# Patient Record
Sex: Female | Born: 1940 | Race: White | Hispanic: No | Smoking: Never smoker
Health system: Southern US, Community
[De-identification: ages and names within clinical notes are randomized; demographics above are authoritative.]

## PROBLEM LIST (undated history)

## (undated) DIAGNOSIS — M8580 Other specified disorders of bone density and structure, unspecified site: Secondary | ICD-10-CM

## (undated) DIAGNOSIS — I10 Essential (primary) hypertension: Secondary | ICD-10-CM

## (undated) DIAGNOSIS — G4733 Obstructive sleep apnea (adult) (pediatric): Secondary | ICD-10-CM

## (undated) HISTORY — DX: Essential (primary) hypertension: I10

## (undated) HISTORY — PX: TONSILLECTOMY AND ADENOIDECTOMY: SHX28

## (undated) HISTORY — DX: Obstructive sleep apnea (adult) (pediatric): G47.33

## (undated) HISTORY — PX: EYE SURGERY: SHX253

## (undated) HISTORY — DX: Other specified disorders of bone density and structure, unspecified site: M85.80

---

## 2000-04-20 ENCOUNTER — Ambulatory Visit (HOSPITAL_COMMUNITY): Admission: RE | Admit: 2000-04-20 | Discharge: 2000-04-20 | Payer: Self-pay | Admitting: Neurosurgery

## 2000-04-20 ENCOUNTER — Encounter: Payer: Self-pay | Admitting: Neurosurgery

## 2001-04-12 ENCOUNTER — Encounter: Payer: Self-pay | Admitting: Neurosurgery

## 2001-04-12 ENCOUNTER — Ambulatory Visit (HOSPITAL_COMMUNITY): Admission: RE | Admit: 2001-04-12 | Discharge: 2001-04-12 | Payer: Self-pay | Admitting: Neurosurgery

## 2008-07-24 DIAGNOSIS — G4733 Obstructive sleep apnea (adult) (pediatric): Secondary | ICD-10-CM | POA: Insufficient documentation

## 2009-01-01 DIAGNOSIS — G4700 Insomnia, unspecified: Secondary | ICD-10-CM

## 2010-03-12 DIAGNOSIS — R609 Edema, unspecified: Secondary | ICD-10-CM

## 2011-04-04 ENCOUNTER — Ambulatory Visit: Payer: Self-pay | Admitting: Family Medicine

## 2011-07-27 ENCOUNTER — Ambulatory Visit: Payer: Self-pay | Admitting: Family Medicine

## 2011-08-01 DIAGNOSIS — M8580 Other specified disorders of bone density and structure, unspecified site: Secondary | ICD-10-CM | POA: Insufficient documentation

## 2011-08-01 DIAGNOSIS — M810 Age-related osteoporosis without current pathological fracture: Secondary | ICD-10-CM | POA: Insufficient documentation

## 2012-05-15 ENCOUNTER — Ambulatory Visit: Payer: Self-pay | Admitting: Family Medicine

## 2013-05-16 ENCOUNTER — Ambulatory Visit: Payer: Self-pay | Admitting: Family Medicine

## 2013-08-28 ENCOUNTER — Ambulatory Visit: Payer: Self-pay | Admitting: Family Medicine

## 2013-08-28 LAB — HM DEXA SCAN

## 2013-09-20 ENCOUNTER — Ambulatory Visit: Payer: Self-pay | Admitting: Gastroenterology

## 2013-09-20 LAB — HM COLONOSCOPY

## 2013-09-23 LAB — PATHOLOGY REPORT

## 2014-05-19 ENCOUNTER — Ambulatory Visit: Payer: Self-pay | Admitting: Family Medicine

## 2014-05-20 ENCOUNTER — Ambulatory Visit: Payer: Self-pay | Admitting: Family Medicine

## 2014-09-09 LAB — BASIC METABOLIC PANEL: GLUCOSE: 100 mg/dL

## 2014-09-09 LAB — LIPID PANEL
Cholesterol: 196 mg/dL (ref 0–200)
HDL: 77 mg/dL — AB (ref 35–70)
LDL Cholesterol: 102 mg/dL
Triglycerides: 85 mg/dL (ref 40–160)

## 2014-09-09 LAB — TSH: TSH: 1.12 u[IU]/mL (ref 0.41–5.90)

## 2014-11-17 ENCOUNTER — Ambulatory Visit: Payer: Self-pay | Admitting: Family Medicine

## 2014-11-17 LAB — HM MAMMOGRAPHY

## 2015-03-10 DIAGNOSIS — H2513 Age-related nuclear cataract, bilateral: Secondary | ICD-10-CM | POA: Diagnosis not present

## 2015-03-31 ENCOUNTER — Other Ambulatory Visit: Payer: Self-pay | Admitting: Family Medicine

## 2015-03-31 DIAGNOSIS — R921 Mammographic calcification found on diagnostic imaging of breast: Secondary | ICD-10-CM

## 2015-05-21 ENCOUNTER — Ambulatory Visit: Payer: Medicare Other

## 2015-05-21 ENCOUNTER — Ambulatory Visit: Payer: Self-pay

## 2015-05-21 ENCOUNTER — Ambulatory Visit
Admission: RE | Admit: 2015-05-21 | Discharge: 2015-05-21 | Disposition: A | Discharge status: Home / Self Care | Payer: Medicare Other | Source: Ambulatory Visit | Attending: Family Medicine | Admitting: Family Medicine

## 2015-05-21 ENCOUNTER — Other Ambulatory Visit: Payer: Self-pay | Admitting: Family Medicine

## 2015-05-21 DIAGNOSIS — R921 Mammographic calcification found on diagnostic imaging of breast: Secondary | ICD-10-CM | POA: Insufficient documentation

## 2015-05-21 DIAGNOSIS — Z09 Encounter for follow-up examination after completed treatment for conditions other than malignant neoplasm: Secondary | ICD-10-CM | POA: Diagnosis not present

## 2015-09-03 ENCOUNTER — Other Ambulatory Visit: Payer: Self-pay

## 2015-09-03 DIAGNOSIS — M791 Myalgia, unspecified site: Secondary | ICD-10-CM | POA: Insufficient documentation

## 2015-09-03 DIAGNOSIS — R921 Mammographic calcification found on diagnostic imaging of breast: Secondary | ICD-10-CM | POA: Insufficient documentation

## 2015-09-07 ENCOUNTER — Encounter: Payer: Self-pay | Admitting: Family Medicine

## 2015-09-07 ENCOUNTER — Ambulatory Visit (INDEPENDENT_AMBULATORY_CARE_PROVIDER_SITE_OTHER): Payer: Medicare Other | Admitting: Family Medicine

## 2015-09-07 VITALS — BP 142/86 | HR 68 | Temp 97.9°F | Resp 16 | Ht 67.0 in | Wt 124.0 lb

## 2015-09-07 DIAGNOSIS — Z0000 Encounter for general adult medical examination without abnormal findings: Secondary | ICD-10-CM | POA: Diagnosis not present

## 2015-09-07 DIAGNOSIS — Z23 Encounter for immunization: Secondary | ICD-10-CM | POA: Diagnosis not present

## 2015-09-07 DIAGNOSIS — M8580 Other specified disorders of bone density and structure, unspecified site: Secondary | ICD-10-CM | POA: Diagnosis not present

## 2015-09-07 DIAGNOSIS — E2839 Other primary ovarian failure: Secondary | ICD-10-CM

## 2015-09-07 DIAGNOSIS — G4733 Obstructive sleep apnea (adult) (pediatric): Secondary | ICD-10-CM

## 2015-09-07 NOTE — Progress Notes (Signed)
Patient: Mary Mosley, Female    DOB: [birth year], 74 y.o.   MRN: [id number] Visit Date: 09/07/2015  Today's Provider: Mila Merry, MD   Chief Complaint  Patient presents with  . Physical exam  . Follow-up    Osteopenia and Vitamin D deficiency   Subjective:    Annual physical  Mary Mosley is a 74 y.o. female. She feels well. She reports exercising daily (yard work). She reports she is sleeping fairly well.  ----------------------------------------------------------- Follow up of Osteopenia and Vitamin D Deficiency: Last office visit was 1 year ago. Management during this visit includes ordering labs which showed Vitamin D level was low. Patient was advised to continue taking Vitamin D supplements. Patient reports good compliance with treatment and good tolerance.     Insomnia  She presents today for evaluation of insomnia. Insomnia is getting unchanged. She has trouble sleeping once or twice a week.   She has difficulty FALLING asleep. She does not have difficulty STAYING asleep. She does not wake frequently to urinate. She does not have urge to move legs when resting. She is not having pain when trying to sleep  She is not having anxiety. She is not having a lot of stress in her life. . She is not having depression.  She is not taking stimulant medications.  She is not taking new medications:  She is not taking OTC sleeping aid. Patient takes 1/2 tablet of Ativan  at bedtime most nights.  She is taking medications to help sleep. She is not drinking alcohol to help sleep. She is not using illicit drugs.  --------------------------------------------------------------------    Review of Systems  Constitutional: Negative for fever, chills and fatigue.  HENT: Negative for congestion, ear pain, rhinorrhea, sneezing and sore throat.   Eyes: Negative.  Negative for pain and redness.  Respiratory: Negative for cough, shortness of breath and wheezing.    Cardiovascular: Negative for chest pain and leg swelling.  Gastrointestinal: Negative for nausea, abdominal pain, diarrhea, constipation and blood in stool.  Endocrine: Negative for polydipsia and polyphagia.  Genitourinary: Negative.  Negative for dysuria, hematuria, flank pain, vaginal bleeding, vaginal discharge and pelvic pain.  Musculoskeletal: Negative for back pain, joint swelling, arthralgias and gait problem.  Skin: Negative for rash.  Neurological: Negative.  Negative for dizziness, tremors, seizures, weakness, light-headedness, numbness and headaches.  Hematological: Negative for adenopathy.  Psychiatric/Behavioral: Negative.  Negative for behavioral problems, confusion and dysphoric mood. The patient is not nervous/anxious and is not hyperactive.     Social History   Social History  . Marital Status: Widowed    Spouse Name: N/A  . Number of Children: 2  . Years of Education: N/A   Occupational History  . Retired    Social History Main Topics  . Smoking status: Never Smoker   . Smokeless tobacco: Not on file  . Alcohol Use: No  . Drug Use: No  . Sexual Activity: Not on file   Other Topics Concern  . Not on file   Social History Narrative    Patient Active Problem List   Diagnosis Date Noted  . Breast calcification, right 09/03/2015  . Myalgia 09/03/2015  . Osteopenia 08/01/2011  . Edema 03/12/2010  . Insomnia 01/01/2009  . OSA (obstructive sleep apnea) 07/24/2008    Past Surgical History  Procedure Laterality Date  . Eye surgery Bilateral     due to trauma  . Tonsillectomy and adenoidectomy      Her family  history includes Heart Problems in her brother and brother; Heart attack in her mother; Lung cancer in her brother.    Previous Medications   CHOLECALCIFEROL (VITAMIN D) 1000 UNITS TABLET    Take 2 tablets by mouth daily.   LORAZEPAM (ATIVAN) 1 MG TABLET    Take 1 tablet by mouth at bedtime as needed.    Patient Care Team: Malva Limes, MD  as PCP - General (Family Medicine)     Objective:   Vitals: BP 142/86 mmHg  Pulse 68  Temp(Src) 97.9 F (36.6 C) (Oral)  Resp 16  Ht  (1.702 m)  Wt 124 lb (56.246 kg)  BMI 19.42 kg/m2  SpO2 97%  Physical Exam    General Appearance:    Alert, cooperative, no distress, appears stated age  Head:    Normocephalic, without obvious abnormality, atraumatic  Eyes:    PERRL, conjunctiva/corneas clear, EOM's intact, fundi    benign, both eyes  Ears:    Normal TM's and external ear canals, both ears  Nose:   Nares normal, septum midline, mucosa normal, no drainage    or sinus tenderness  Throat:   Lips, mucosa, and tongue normal; teeth and gums normal  Neck:   Supple, symmetrical, trachea midline, no adenopathy;    thyroid:  no enlargement/tenderness/nodules; no carotid   bruit or JVD  Back:     Symmetric, no curvature, ROM normal, no CVA tenderness  Lungs:     Clear to auscultation bilaterally, respirations unlabored  Chest Wall:    No tenderness or deformity   Heart:    Regular rate and rhythm, S1 and S2 normal, no murmur, rub   or gallop  Breast Exam:    Normal appearance, no masses or tenderness  Abdomen:     Soft, non-tender, bowel sounds active all four quadrants,    no masses, no organomegaly  Pelvic:    deferred  Extremities:   Extremities normal, atraumatic, no cyanosis or edema  Pulses:   2+ and symmetric all extremities  Skin:   Skin color, texture, turgor normal, no rashes or lesions  Lymph nodes:   Cervical, supraclavicular, and axillary nodes normal  Neurologic:   CNII-XII intact, normal strength, sensation and reflexes    throughout    Activities of Daily Living In your present state of health, do you have any difficulty performing the following activities: 09/07/2015  Hearing? N  Vision? N  Difficulty concentrating or making decisions? N  Walking or climbing stairs? N  Dressing or bathing? N  Doing errands, shopping? N    Fall Risk Assessment Fall  Risk  09/07/2015  Falls in the past year? No     Depression Screen PHQ 2/9 Scores 09/07/2015  PHQ - 2 Score 0  PHQ- 9 Score 1    Cognitive Testing - 6-CIT  Correct? Score   What year is it? yes 0 0 or 4  What month is it? yes 0 0 or 3  Memorize:    Floyde Mosley,  40,  High [address]  [place],      What time is it? (within 1 hour) yes 0 0 or 3  Count backwards from 20 yes 0 0, 2, or 4  Name the months of the year yes 0 0, 2, or 4  Repeat name & address above yes 0 0, 2, 4, 6, 8, or 10       TOTAL SCORE  0/28   Interpretation:  Normal  Normal (0-7) Abnormal (  8-28)    Audit-C Alcohol Use Screening  Question Answer Points  How often do you have alcoholic drink? never 0  On days you do drink alcohol, how many drinks do you typically consume? 0 0  How oftey will you drink 6 or more in a total? never 0  Total Score:  0   A score of 3 or more in women, and 4 or more in men indicates increased risk for alcohol abuse, EXCEPT if all of the points are from question 1.    Assessment & Plan:     Physical  Reviewed patient's Family Medical History Reviewed and updated list of patient's medical providers Assessment of cognitive impairment was done Assessed patient's functional ability Established a written schedule for health screening services Health Risk Assessent Completed and Reviewed  Exercise Activities and Dietary recommendations Goals    None      Immunization History  Administered Date(s) Administered  . Pneumococcal Conjugate-13 09/04/2014  . Pneumococcal Polysaccharide-23 01/08/2004    Health Maintenance  Topic Date Due  . TETANUS/TDAP  01/07/1958  . ZOSTAVAX  01/07/1999  . INFLUENZA VACCINE  06/22/2015  . DEXA SCAN  Completed  . PNA vac Low Risk Adult  Completed      Discussed health benefits of physical activity, and encouraged her to engage in regular exercise appropriate for her age and condition.      ------------------------------------------------------------------------------------------------------------  1. Annual physical exam   2. Estrogen deficiency  - DG Bone Density; Future  3. Osteopenia   4. Need for influenza vaccination  - Flu vaccine HIGH DOSE PF  5. OSA (obstructive sleep apnea) history

## 2015-09-08 ENCOUNTER — Other Ambulatory Visit: Payer: Self-pay | Admitting: Family Medicine

## 2015-09-08 MED ORDER — LORAZEPAM 1 MG PO TABS: 1.0000 mg | ORAL_TABLET | Freq: Every evening | ORAL | Status: DC | PRN

## 2015-09-08 NOTE — Telephone Encounter (Signed)
Pt contacted office for refill request on the following medications: LORazepam (ATIVAN) 1 MG tablet to [facility] [address]. Pt stated this was supposed to have been sent in yesterday after her OV. Thanks TNP

## 2015-09-08 NOTE — Telephone Encounter (Signed)
Please call in lorazepam.  

## 2015-09-09 NOTE — Telephone Encounter (Signed)
Rx called in to pharmacy. 

## 2015-09-14 ENCOUNTER — Ambulatory Visit
Admission: RE | Admit: 2015-09-14 | Discharge: 2015-09-14 | Disposition: A | Discharge status: Home / Self Care | Payer: Medicare Other | Source: Ambulatory Visit | Attending: Family Medicine | Admitting: Family Medicine

## 2015-09-14 DIAGNOSIS — M85832 Other specified disorders of bone density and structure, left forearm: Secondary | ICD-10-CM | POA: Insufficient documentation

## 2015-09-14 DIAGNOSIS — M85852 Other specified disorders of bone density and structure, left thigh: Secondary | ICD-10-CM | POA: Insufficient documentation

## 2015-09-14 DIAGNOSIS — E2839 Other primary ovarian failure: Secondary | ICD-10-CM | POA: Insufficient documentation

## 2016-04-12 ENCOUNTER — Other Ambulatory Visit: Payer: Self-pay | Admitting: Family Medicine

## 2016-04-12 DIAGNOSIS — Z1231 Encounter for screening mammogram for malignant neoplasm of breast: Secondary | ICD-10-CM

## 2016-05-17 DIAGNOSIS — I8391 Asymptomatic varicose veins of right lower extremity: Secondary | ICD-10-CM | POA: Diagnosis not present

## 2016-05-17 DIAGNOSIS — M7989 Other specified soft tissue disorders: Secondary | ICD-10-CM | POA: Diagnosis not present

## 2016-05-17 DIAGNOSIS — I8393 Asymptomatic varicose veins of bilateral lower extremities: Secondary | ICD-10-CM | POA: Diagnosis not present

## 2016-05-18 DIAGNOSIS — H269 Unspecified cataract: Secondary | ICD-10-CM | POA: Diagnosis not present

## 2016-05-19 ENCOUNTER — Other Ambulatory Visit: Payer: Self-pay | Admitting: Family Medicine

## 2016-05-19 MED ORDER — LORAZEPAM 1 MG PO TABS: 1.0000 mg | ORAL_TABLET | Freq: Every evening | ORAL | Status: DC | PRN

## 2016-05-19 NOTE — Telephone Encounter (Signed)
Pt contacted office for refill request on the following medications:  LORazepam (ATIVAN) 1 MG tablet.  [facility] [address]  CB#[phone]/MW °

## 2016-05-19 NOTE — Telephone Encounter (Signed)
Printed, please fax or call in to pharmacy. Thank you.   

## 2016-05-23 ENCOUNTER — Other Ambulatory Visit: Payer: Self-pay | Admitting: Family Medicine

## 2016-05-23 ENCOUNTER — Ambulatory Visit
Admission: RE | Admit: 2016-05-23 | Discharge: 2016-05-23 | Disposition: A | Discharge status: Home / Self Care | Payer: Medicare Other | Source: Ambulatory Visit | Attending: Family Medicine | Admitting: Family Medicine

## 2016-05-23 DIAGNOSIS — Z1231 Encounter for screening mammogram for malignant neoplasm of breast: Secondary | ICD-10-CM | POA: Insufficient documentation

## 2016-06-21 DIAGNOSIS — I8393 Asymptomatic varicose veins of bilateral lower extremities: Secondary | ICD-10-CM | POA: Diagnosis not present

## 2016-06-21 DIAGNOSIS — M7989 Other specified soft tissue disorders: Secondary | ICD-10-CM | POA: Diagnosis not present

## 2016-07-11 ENCOUNTER — Other Ambulatory Visit: Payer: Self-pay | Admitting: Family Medicine

## 2016-07-11 MED ORDER — LORAZEPAM 1 MG PO TABS: 1.0000 mg | ORAL_TABLET | Freq: Every evening | ORAL | 3 refills | Status: DC | PRN

## 2016-07-11 NOTE — Telephone Encounter (Signed)
Pt contacted office for refill request on the following medications:  LORazepam (ATIVAN) 1 MG tablet.  [facility] [address]  WU#[phone]/NFCB#[phone]/MW

## 2016-07-11 NOTE — Telephone Encounter (Signed)
Rx called in to pharmacy. 

## 2016-07-11 NOTE — Telephone Encounter (Signed)
Please call in lorazepam.  

## 2016-09-15 ENCOUNTER — Encounter: Payer: Self-pay | Admitting: Family Medicine

## 2016-09-15 ENCOUNTER — Ambulatory Visit (INDEPENDENT_AMBULATORY_CARE_PROVIDER_SITE_OTHER): Payer: Medicare Other | Admitting: Family Medicine

## 2016-09-15 VITALS — BP 148/78 | HR 68 | Temp 97.7°F | Resp 16 | Ht 67.0 in | Wt 123.0 lb

## 2016-09-15 DIAGNOSIS — M8580 Other specified disorders of bone density and structure, unspecified site: Secondary | ICD-10-CM | POA: Diagnosis not present

## 2016-09-15 DIAGNOSIS — G4700 Insomnia, unspecified: Secondary | ICD-10-CM | POA: Diagnosis not present

## 2016-09-15 DIAGNOSIS — Z131 Encounter for screening for diabetes mellitus: Secondary | ICD-10-CM | POA: Diagnosis not present

## 2016-09-15 DIAGNOSIS — Z0000 Encounter for general adult medical examination without abnormal findings: Secondary | ICD-10-CM

## 2016-09-15 DIAGNOSIS — Z23 Encounter for immunization: Secondary | ICD-10-CM | POA: Diagnosis not present

## 2016-09-15 LAB — GLUCOSE, POCT (MANUAL RESULT ENTRY): POC GLUCOSE: 97 mg/dL (ref 70–99)

## 2016-09-15 MED ORDER — LORAZEPAM 1 MG PO TABS: 1.0000 mg | ORAL_TABLET | Freq: Every evening | ORAL | 3 refills | Status: DC | PRN

## 2016-09-15 NOTE — Progress Notes (Signed)
Patient: Mary Mosley, Female    DOB: [birth year], 75 y.o.   MRN: [id number] Visit Date: 09/15/2016  Today's Provider: Mila Merryonald Reia Viernes, MD   Chief Complaint  Patient presents with  . Annual Exam  . Follow-up    Estrogen Deficiency, Osteopenia, OSA   Subjective:    Annual wellness visit Mary Mosley is a 75 y.o. female. She feels fairly well. She reports exercising daily. She reports she is sleeping fairly well.  ----------------------------------------------------------- Follow up Estrogen Deficiency:  Patient was last seen for this problem 1 year ago. BMD was ordered showing Osteopenia was slightly worse. Will repeat in 2 years.   Follow up Osteopenia:  Patient was last seen for this problem 1 year ago and no changes were made. BMD 08/2015 stable. Still taking 2 vitamin d daily  Obstructive sleep Apnea:  Patient was last seen for this problem 1 year ago and no changes were made made.   Follow up Insomnia:   Current treatment includes Lorazepam 1mg . Patient has been taking 1/2 tablet at night. Patient reports good compliance with treatment, good tolerance and good symptom control.    Review of Systems  Constitutional: Negative for chills, fatigue and fever.  HENT: Negative for congestion, ear pain, rhinorrhea, sneezing and sore throat.   Eyes: Negative.  Negative for pain and redness.  Respiratory: Negative for cough, shortness of breath and wheezing.   Cardiovascular: Negative for chest pain and leg swelling.  Gastrointestinal: Negative for abdominal pain, blood in stool, constipation, diarrhea and nausea.  Endocrine: Negative for polydipsia and polyphagia.  Genitourinary: Negative.  Negative for dysuria, flank pain, hematuria, pelvic pain, vaginal bleeding and vaginal discharge.  Musculoskeletal: Negative for arthralgias, back pain, gait problem and joint swelling.  Skin: Negative for rash.  Neurological: Negative.  Negative for dizziness, tremors, seizures,  weakness, light-headedness, numbness and headaches.  Hematological: Negative for adenopathy.  Psychiatric/Behavioral: Negative.  Negative for behavioral problems, confusion and dysphoric mood. The patient is not nervous/anxious and is not hyperactive.     Social History   Social History  . Marital status: Widowed    Spouse name: N/A  . Number of children: 2  . Years of education: N/A   Occupational History  . Retired    Social History Main Topics  . Smoking status: Never Smoker  . Smokeless tobacco: Never Used  . Alcohol use No  . Drug use: No  . Sexual activity: Not on file   Other Topics Concern  . Not on file   Social History Narrative  . No narrative on file    Past Medical History:  Diagnosis Date  . OSA (obstructive sleep apnea)   . Osteopenia      Patient Active Problem List   Diagnosis Date Noted  . Breast calcification, right 09/03/2015  . Myalgia 09/03/2015  . Osteopenia 08/01/2011  . Edema 03/12/2010  . Insomnia 01/01/2009  . OSA (obstructive sleep apnea) 07/24/2008    Past Surgical History:  Procedure Laterality Date  . EYE SURGERY Bilateral    due to trauma  . TONSILLECTOMY AND ADENOIDECTOMY      Her family history includes Heart Problems in her brother and brother; Heart attack in her mother; Lung cancer in her brother.    Current Meds  Medication Sig  . cholecalciferol (VITAMIN D) 1000 UNITS tablet Take 2 tablets by mouth daily.  Marland Kitchen. LORazepam (ATIVAN) 1 MG tablet Take 1 tablet (1 mg total) by mouth at bedtime as needed.  Patient Care Team: Malva Limes, MD as PCP - General (Family Medicine)    Objective:   Vitals: BP (!) 148/78 (BP Location: Left Arm, Patient Position: Sitting, Cuff Size: Normal)   Pulse 68   Temp 97.7 F (36.5 C) (Oral)   Resp 16   Ht 5\' 7"  (1.702 m)   Wt 123 lb (55.8 kg)   SpO2 99% Comment: room air  BMI 19.26 kg/m   Physical Exam    General Appearance:    Alert, cooperative, no distress, appears  stated age  Head:    Normocephalic, without obvious abnormality, atraumatic  Eyes:    PERRL, conjunctiva/corneas clear, EOM's intact, fundi    benign, both eyes  Ears:    Normal TM's and external ear canals, both ears  Nose:   Nares normal, septum midline, mucosa normal, no drainage    or sinus tenderness  Throat:   Lips, mucosa, and tongue normal; teeth and gums normal  Neck:   Supple, symmetrical, trachea midline, no adenopathy;    thyroid:  no enlargement/tenderness/nodules; no carotid   bruit or JVD  Back:     Symmetric, no curvature, ROM normal, no CVA tenderness  Lungs:     Clear to auscultation bilaterally, respirations unlabored  Chest Wall:    No tenderness or deformity   Heart:    Regular rate and rhythm, S1 and S2 normal, no murmur, rub   or gallop  Breast Exam:    normal appearance, no masses or tenderness, deferred  Abdomen:     Soft, non-tender, bowel sounds active all four quadrants,    no masses, no organomegaly  Pelvic:    deferred  Extremities:   Extremities normal, atraumatic, no cyanosis or edema  Pulses:   2+ and symmetric all extremities  Skin:   Skin color, texture, turgor normal, no rashes or lesions  Lymph nodes:   Cervical, supraclavicular, and axillary nodes normal  Neurologic:   CNII-XII intact, normal strength, sensation and reflexes    throughout    Activities of Daily Living In your present state of health, do you have any difficulty performing the following activities: 09/15/2016  Hearing? N  Vision? N  Difficulty concentrating or making decisions? N  Walking or climbing stairs? N  Dressing or bathing? N  Doing errands, shopping? N  Some recent data might be hidden    Fall Risk Assessment Fall Risk  09/15/2016 09/07/2015  Falls in the past year? No No     Depression Screen PHQ 2/9 Scores 09/15/2016 09/07/2015  PHQ - 2 Score 0 0  PHQ- 9 Score 0 1    Cognitive Testing - 6-CIT  Correct? Score   What year is it? yes 0 0 or 4  What month  is it? yes 0 0 or 3  Memorize:    Floyde Parkins,  40,  High [address]  [place],      What time is it? (within 1 hour) yes 0 0 or 3  Count backwards from 20 yes 0 0, 2, or 4  Name the months of the year yes 0 0, 2, or 4  Repeat name & address above yes 0 0, 2, 4, 6, 8, or 10       TOTAL SCORE  0/28   Interpretation:  Normal  Normal (0-7) Abnormal (8-28)    Audit-C Alcohol Use Screening  Question Answer Points  How often do you have alcoholic drink? never 0  On days you do drink alcohol, how many  drinks do you typically consume? n/a 0  How oftey will you drink 6 or more in a total? never 0  Total Score:  0   A score of 3 or more in women, and 4 or more in men indicates increased risk for alcohol abuse, EXCEPT if all of the points are from question 1.   Current Exercise Habits: Home exercise routine, Time (Minutes): > 60, Frequency (Times/Week): 7, Weekly Exercise (Minutes/Week): 0, Intensity: Moderate Exercise limited by: None identified     Assessment & Plan:     Annual Wellness Visit  Reviewed patient's Family Medical History Reviewed and updated list of patient's medical providers Assessment of cognitive impairment was done Assessed patient's functional ability Established a written schedule for health screening services Health Risk Assessent Completed and Reviewed  Exercise Activities and Dietary recommendations Goals    None      Immunization History  Administered Date(s) Administered  . Influenza, High Dose Seasonal PF 09/07/2015  . Pneumococcal Conjugate-13 09/04/2014  . Pneumococcal Polysaccharide-23 01/08/2004    Health Maintenance  Topic Date Due  . TETANUS/TDAP  01/07/1958  . ZOSTAVAX  01/07/1999  . INFLUENZA VACCINE  06/21/2016  . DEXA SCAN  Completed  . PNA vac Low Risk Adult  Completed      Discussed health benefits of physical activity, and encouraged her to engage in regular exercise appropriate for her age and condition.      ------------------------------------------------------------------------------------------------------------  1. Annual physical exam Doing well.   2. Need for influenza vaccination  - Flu vaccine HIGH DOSE PF (Fluzone High dose)  3. Osteopenia, unspecified location Continue vitamin d supplementation and check BMD in 2019   4. Insomnia, unspecified type Doing well with prn lorazepam  5. Diabetes mellitus screening  - POCT Glucose (CBG)   Mila Merry, MD  [facility]

## 2016-10-06 ENCOUNTER — Telehealth: Payer: Self-pay | Admitting: Family Medicine

## 2016-12-23 ENCOUNTER — Telehealth: Payer: Self-pay | Admitting: Family Medicine

## 2017-01-25 ENCOUNTER — Encounter: Payer: Medicare Other | Admitting: Family Medicine

## 2017-01-25 ENCOUNTER — Ambulatory Visit: Payer: Medicare Other

## 2017-04-12 ENCOUNTER — Other Ambulatory Visit: Payer: Self-pay | Admitting: Family Medicine

## 2017-04-12 DIAGNOSIS — Z1231 Encounter for screening mammogram for malignant neoplasm of breast: Secondary | ICD-10-CM

## 2017-05-19 ENCOUNTER — Telehealth: Payer: Self-pay | Admitting: Family Medicine

## 2017-05-19 NOTE — Telephone Encounter (Signed)
Last filled 09/15/16 please review and advise.KW

## 2017-05-19 NOTE — Telephone Encounter (Signed)
Pt needs new rx on her   LORazepam (ATIVAN) 1 MG tablet  [facility] [address]  [place]

## 2017-05-22 MED ORDER — LORAZEPAM 1 MG PO TABS: 1.0000 mg | ORAL_TABLET | Freq: Every evening | ORAL | 3 refills | Status: DC | PRN

## 2017-05-22 NOTE — Telephone Encounter (Signed)
Please call in lorazepam.  

## 2017-05-22 NOTE — Telephone Encounter (Signed)
rx called in-aa 

## 2017-05-26 ENCOUNTER — Ambulatory Visit
Admission: RE | Admit: 2017-05-26 | Discharge: 2017-05-26 | Disposition: A | Discharge status: Home / Self Care | Payer: Medicare Other | Source: Ambulatory Visit | Attending: Family Medicine | Admitting: Family Medicine

## 2017-05-26 DIAGNOSIS — Z1231 Encounter for screening mammogram for malignant neoplasm of breast: Secondary | ICD-10-CM

## 2017-06-12 DIAGNOSIS — H2513 Age-related nuclear cataract, bilateral: Secondary | ICD-10-CM | POA: Diagnosis not present

## 2017-07-05 ENCOUNTER — Telehealth: Payer: Self-pay

## 2017-07-05 NOTE — Telephone Encounter (Signed)
LMTCB and r/s AWV with [facility]. Previous apt in 03/18 was cancelled by pt.

## 2017-07-21 ENCOUNTER — Ambulatory Visit (INDEPENDENT_AMBULATORY_CARE_PROVIDER_SITE_OTHER): Payer: Medicare Other

## 2017-07-21 VITALS — BP 144/74 | HR 84 | Temp 99.1°F | Ht 67.0 in | Wt 127.4 lb

## 2017-07-21 DIAGNOSIS — Z23 Encounter for immunization: Secondary | ICD-10-CM

## 2017-07-21 DIAGNOSIS — Z0000 Encounter for general adult medical examination without abnormal findings: Secondary | ICD-10-CM | POA: Diagnosis not present

## 2017-07-21 NOTE — Patient Instructions (Signed)
[name] , Thank you for taking time to come for your Medicare Wellness Visit. I appreciate your ongoing commitment to your health goals. Please review the following plan we discussed and let me know if I can assist you in the future.   Screening recommendations/referrals: Colonoscopy: up to date, no longer required Mammogram: up to date Bone Density: up to date Recommended yearly ophthalmology/optometry visit for glaucoma screening and checkup Recommended yearly dental visit for hygiene and checkup  Vaccinations: Influenza vaccine: completed Pneumococcal vaccine: completed series Tdap vaccine: declined Shingles vaccine: declined   Advanced directives: Please bring a copy of your POA (Power of Attorney) and/or Living Will to your next appointment.   Conditions/risks identified: Recommend increasing water intake to 6-8 glasses of water a day.   Next appointment: 09/18/17 @ 9:00 AM   Preventive Care 65 Years and Older, Female Preventive care refers to lifestyle choices and visits with your health care provider that can promote health and wellness. What does preventive care include?  A yearly physical exam. This is also called an annual well check.  Dental exams once or twice a year.  Routine eye exams. Ask your health care provider how often you should have your eyes checked.  Personal lifestyle choices, including:  Daily care of your teeth and gums.  Regular physical activity.  Eating a healthy diet.  Avoiding tobacco and drug use.  Limiting alcohol use.  Practicing safe sex.  Taking low-dose aspirin every day.  Taking vitamin and mineral supplements as recommended by your health care provider. What happens during an annual well check? The services and screenings done by your health care provider during your annual well check will depend on your age, overall health, lifestyle risk factors, and family history of disease. Counseling  Your health care provider may  ask you questions about your:  Alcohol use.  Tobacco use.  Drug use.  Emotional well-being.  Home and relationship well-being.  Sexual activity.  Eating habits.  History of falls.  Memory and ability to understand (cognition).  Work and work Astronomerenvironment.  Reproductive health. Screening  You may have the following tests or measurements:  Height, weight, and BMI.  Blood pressure.  Lipid and cholesterol levels. These may be checked every 5 years, or more frequently if you are over 78 years old.  Skin check.  Lung cancer screening. You may have this screening every year starting at age 78 if you have a 30-pack-year history of smoking and currently smoke or have quit within the past 15 years.  Fecal occult blood test (FOBT) of the stool. You may have this test every year starting at age 78.  Flexible sigmoidoscopy or colonoscopy. You may have a sigmoidoscopy every 5 years or a colonoscopy every 10 years starting at age 78.  Hepatitis C blood test.  Hepatitis B blood test.  Sexually transmitted disease (STD) testing.  Diabetes screening. This is done by checking your blood sugar (glucose) after you have not eaten for a while (fasting). You may have this done every 1-3 years.  Bone density scan. This is done to screen for osteoporosis. You may have this done starting at age 78.  Mammogram. This may be done every 1-2 years. Talk to your health care provider about how often you should have regular mammograms. Talk with your health care provider about your test results, treatment options, and if necessary, the need for more tests. Vaccines  Your health care provider may recommend certain vaccines, such as:  Influenza vaccine.  This is recommended every year.  Tetanus, diphtheria, and acellular pertussis (Tdap, Td) vaccine. You may need a Td booster every 10 years.  Zoster vaccine. You may need this after age 62.  Pneumococcal 13-valent conjugate (PCV13) vaccine. One  dose is recommended after age 22.  Pneumococcal polysaccharide (PPSV23) vaccine. One dose is recommended after age 36. Talk to your health care provider about which screenings and vaccines you need and how often you need them. This information is not intended to replace advice given to you by your health care provider. Make sure you discuss any questions you have with your health care provider. Document Released: 12/04/2015 Document Revised: 07/27/2016 Document Reviewed: 09/08/2015 Elsevier Interactive Patient Education  2017 [employer] Prevention in the Home Falls can cause injuries. They can happen to people of all ages. There are many things you can do to make your home safe and to help prevent falls. What can I do on the outside of my home?  Regularly fix the edges of walkways and driveways and fix any cracks.  Remove anything that might make you trip as you walk through a door, such as a raised step or threshold.  Trim any bushes or trees on the path to your home.  Use bright outdoor lighting.  Clear any walking paths of anything that might make someone trip, such as rocks or tools.  Regularly check to see if handrails are loose or broken. Make sure that both sides of any steps have handrails.  Any raised decks and porches should have guardrails on the edges.  Have any leaves, snow, or ice cleared regularly.  Use sand or salt on walking paths during winter.  Clean up any spills in your garage right away. This includes oil or grease spills. What can I do in the bathroom?  Use night lights.  Install grab bars by the toilet and in the tub and shower. Do not use towel bars as grab bars.  Use non-skid mats or decals in the tub or shower.  If you need to sit down in the shower, use a plastic, non-slip stool.  Keep the floor dry. Clean up any water that spills on the floor as soon as it happens.  Remove soap buildup in the tub or shower regularly.  Attach bath  mats securely with double-sided non-slip rug tape.  Do not have throw rugs and other things on the floor that can make you trip. What can I do in the bedroom?  Use night lights.  Make sure that you have a light by your bed that is easy to reach.  Do not use any sheets or blankets that are too big for your bed. They should not hang down onto the floor.  Have a firm chair that has side arms. You can use this for support while you get dressed.  Do not have throw rugs and other things on the floor that can make you trip. What can I do in the kitchen?  Clean up any spills right away.  Avoid walking on wet floors.  Keep items that you use a lot in easy-to-reach places.  If you need to reach something above you, use a strong step stool that has a grab bar.  Keep electrical cords out of the way.  Do not use floor polish or wax that makes floors slippery. If you must use wax, use non-skid floor wax.  Do not have throw rugs and other things on the floor that can make  you trip. What can I do with my stairs?  Do not leave any items on the stairs.  Make sure that there are handrails on both sides of the stairs and use them. Fix handrails that are broken or loose. Make sure that handrails are as long as the stairways.  Check any carpeting to make sure that it is firmly attached to the stairs. Fix any carpet that is loose or worn.  Avoid having throw rugs at the top or bottom of the stairs. If you do have throw rugs, attach them to the floor with carpet tape.  Make sure that you have a light switch at the top of the stairs and the bottom of the stairs. If you do not have them, ask someone to add them for you. What else can I do to help prevent falls?  Wear shoes that:  Do not have high heels.  Have rubber bottoms.  Are comfortable and fit you well.  Are closed at the toe. Do not wear sandals.  If you use a stepladder:  Make sure that it is fully opened. Do not climb a closed  stepladder.  Make sure that both sides of the stepladder are locked into place.  Ask someone to hold it for you, if possible.  Clearly mark and make sure that you can see:  Any grab bars or handrails.  First and last steps.  Where the edge of each step is.  Use tools that help you move around (mobility aids) if they are needed. These include:  Canes.  Walkers.  Scooters.  Crutches.  Turn on the lights when you go into a dark area. Replace any light bulbs as soon as they burn out.  Set up your furniture so you have a clear path. Avoid moving your furniture around.  If any of your floors are uneven, fix them.  If there are any pets around you, be aware of where they are.  Review your medicines with your doctor. Some medicines can make you feel dizzy. This can increase your chance of falling. Ask your doctor what other things that you can do to help prevent falls. This information is not intended to replace advice given to you by your health care provider. Make sure you discuss any questions you have with your health care provider. Document Released: 09/03/2009 Document Revised: 04/14/2016 Document Reviewed: 12/12/2014 Elsevier Interactive Patient Education  2017 [employer].

## 2017-07-21 NOTE — Progress Notes (Deleted)
  Subjective:    HPI Mary Mosley is a 76 y.o. female who presents today for her Subsequent Annual Wellness Visit.  Patient/Caregiver input:   Review of Systems *** Constitutional: Negative for fever or weight change.  Respiratory: Negative for cough and shortness of breath.   Cardiovascular: Negative for chest pain or palpitations.  Gastrointestinal: Negative for abdominal pain, no bowel changes.  Musculoskeletal: Negative for gait problem or joint swelling.  Skin: Negative for rash.  Neurological: Negative for dizziness or headache.  No other specific complaints in a complete review of systems (except as listed in HPI above).  Past Medical History:  Diagnosis Date  . OSA (obstructive sleep apnea)   . Osteopenia     Past Surgical History:  Procedure Laterality Date  . EYE SURGERY Bilateral    due to trauma  . TONSILLECTOMY AND ADENOIDECTOMY      Family History  Problem Relation Age of Onset  . Heart attack Mother   . Lung cancer Brother   . Heart Problems Brother   . Heart Problems Brother        Has stents  . Liver cancer Son   . Breast cancer Neg Hx       Objective:   Vitals: There were no vitals taken for this visit. There is no height or weight on file to calculate BMI.  Vision Screening Comments: Pt saw Dr B in 08/18  Physical Exam Constitutional: Patient appears well-developed and well-nourished. Obese *** No distress.  HEENT: head atraumatic, normocephalic, pupils equal and reactive to light, ears ***, neck supple, throat within normal limits Cardiovascular: Normal rate, regular rhythm and normal heart sounds.  No murmur heard. No BLE edema. Pulmonary/Chest: Effort normal and breath sounds normal. No respiratory distress. Abdominal: Soft.  There is no tenderness. Psychiatric: Patient has a normal mood and affect. behavior is normal. Judgment and thought content normal.  Cognitive Testing - 6-CIT  Correct? Score   What year is it? {YES NO:22349}  {Numbers; 0-4:31231} Yes = 0    No = 4  What month is it? {YES NO:22349} {Numbers; 0-4:31231} Yes = 0    No = 3  Remember:     Floyde ParkinsJohn Smith, [address] [place], [place]     What time is it? {YES NO:22349} {Numbers; 0-4:31231} Yes = 0    No = 3  Count backwards from 20 to 1 {YES NO:22349} {Numbers; 0-4:31231} Correct = 0    1 error = 2   More than 1 error = 4  Say the months of the year in reverse. {YES NO:22349} {Numbers; 0-4:31231} Correct = 0    1 error = 2   More than 1 error = 4  What address did I ask you to remember? {YES NO:22349} {NUMBERS; 0-10:5044} Correct = 0  1 error = 2    2 error = 4    3 error = 6    4 error = 8    All wrong = 10       TOTAL SCORE  {Numbers; 1-61:09604}/540-30:31392}/28   Interpretation:  {Desc; normal/abnormal:11317::"Normal"}  Normal (0-7) Abnormal (8-28)

## 2017-07-21 NOTE — Progress Notes (Signed)
Subjective:   Mary Mosley is a 76 y.o. female who presents for Medicare Annual (Subsequent) preventive examination.  Review of Systems:  N/A  Cardiac Risk Factors include: advanced age (>8555men, 8>65 women)     Objective:     Vitals: BP (!) 144/74 (BP Location: Left Arm)   Pulse 84   Temp 99.1 F (37.3 C) (Oral)   Ht 5\' 7"  (1.702 m)   Wt 127 lb 6.4 oz (57.8 kg)   BMI 19.95 kg/m   Body mass index is 19.95 kg/m.   Tobacco History  Smoking Status  . Never Smoker  Smokeless Tobacco  . Never Used     Counseling given: Not Answered   Past Medical History:  Diagnosis Date  . OSA (obstructive sleep apnea)   . Osteopenia    Past Surgical History:  Procedure Laterality Date  . EYE SURGERY Bilateral    due to trauma  . TONSILLECTOMY AND ADENOIDECTOMY     Family History  Problem Relation Age of Onset  . Heart attack Mother   . Lung cancer Brother   . Heart Problems Brother   . Heart Problems Brother        Has stents  . Liver cancer Son   . Breast cancer Neg Hx    History  Sexual Activity  . Sexual activity: Not on file    Outpatient Encounter Prescriptions as of 07/21/2017  Medication Sig  . cholecalciferol (VITAMIN D) 1000 UNITS tablet Take 2 tablets by mouth daily.  Marland Kitchen. LORazepam (ATIVAN) 1 MG tablet Take 1 tablet (1 mg total) by mouth at bedtime as needed.   No facility-administered encounter medications on file as of 07/21/2017.     Activities of Daily Living In your present state of health, do you have any difficulty performing the following activities: 07/21/2017 09/15/2016  Hearing? N N  Vision? N N  Difficulty concentrating or making decisions? N N  Walking or climbing stairs? N N  Dressing or bathing? N N  Doing errands, shopping? N N  Preparing Food and eating ? N -  Using the Toilet? N -  In the past six months, have you accidently leaked urine? N -  Do you have problems with loss of bowel control? N -  Managing your Medications? N -    Managing your Finances? N -  Housekeeping or managing your Housekeeping? N -  Some recent data might be hidden    Patient Care Team: Malva LimesFisher, Donald E, MD as PCP - General (Family Medicine) [place] CraneKing, Bradley Mark, MD as Consulting Physician (Ophthalmology)    Assessment:     Exercise Activities and Dietary recommendations Current Exercise Habits: Structured exercise class, Type of exercise: treadmill;walking;Other - see comments;stretching (eliptical), Time (Minutes): 60, Frequency (Times/Week): 3, Weekly Exercise (Minutes/Week): 180, Intensity: Mild, Exercise limited by: None identified  Goals    . Increase water intake          Recommend increasing water intake to 6-8 glasses of water a day.       Fall Risk Fall Risk  07/21/2017 09/15/2016 09/07/2015  Falls in the past year? No No No   Depression Screen PHQ 2/9 Scores 07/21/2017 09/15/2016 09/07/2015  PHQ - 2 Score 0 0 0  PHQ- 9 Score - 0 1     Cognitive Function     6CIT Screen 07/21/2017  What Year? 0 points  What month? 0 points  What time? 0 points  Count back from 20 0 points  Months  in reverse 0 points  Repeat phrase 4 points  Total Score 4    Immunization History  Administered Date(s) Administered  . Influenza, High Dose Seasonal PF 09/07/2015, 09/15/2016, 07/21/2017  . Pneumococcal Conjugate-13 09/04/2014  . Pneumococcal Polysaccharide-23 01/08/2004   Screening Tests Health Maintenance  Topic Date Due  . TETANUS/TDAP  11/21/2026 (Originally 01/07/1960)  . INFLUENZA VACCINE  Completed  . DEXA SCAN  Completed  . PNA vac Low Risk Adult  Completed      Plan:  I have personally reviewed and addressed the Medicare Annual Wellness questionnaire and have noted the following in the patient's chart:  A. Medical and social history B. Use of alcohol, tobacco or illicit drugs  C. Current medications and supplements D. Functional ability and status E.  Nutritional status F.  Physical activity G. Advance  directives H. List of other physicians I.  Hospitalizations, surgeries, and ER visits in previous 12 months J.  Vitals K. Screenings such as hearing and vision if needed, cognitive and depression L. Referrals and appointments - none  In addition, I have reviewed and discussed with patient certain preventive protocols, quality metrics, and best practice recommendations. A written personalized care plan for preventive services as well as general preventive health recommendations were provided to patient.  See attached scanned questionnaire for additional information.   Signed,  Hyacinth Meeker, LPN Nurse Health Advisor   MD Recommendations: None. Pt declined tetanus vaccine today.

## 2017-09-18 ENCOUNTER — Encounter: Payer: Self-pay | Admitting: Family Medicine

## 2017-09-18 ENCOUNTER — Ambulatory Visit (INDEPENDENT_AMBULATORY_CARE_PROVIDER_SITE_OTHER): Payer: Medicare Other | Admitting: Family Medicine

## 2017-09-18 VITALS — BP 140/70 | HR 80 | Temp 97.8°F | Resp 16 | Ht 66.0 in | Wt 124.0 lb

## 2017-09-18 DIAGNOSIS — M8580 Other specified disorders of bone density and structure, unspecified site: Secondary | ICD-10-CM

## 2017-09-18 DIAGNOSIS — Z131 Encounter for screening for diabetes mellitus: Secondary | ICD-10-CM | POA: Diagnosis not present

## 2017-09-18 DIAGNOSIS — G4700 Insomnia, unspecified: Secondary | ICD-10-CM | POA: Diagnosis not present

## 2017-09-18 DIAGNOSIS — Z0000 Encounter for general adult medical examination without abnormal findings: Secondary | ICD-10-CM

## 2017-09-18 LAB — GLUCOSE, POCT (MANUAL RESULT ENTRY): POC Glucose: 89 mg/dl (ref 70–99)

## 2017-09-18 MED ORDER — LORAZEPAM 1 MG PO TABS: 1.0000 mg | ORAL_TABLET | Freq: Every evening | ORAL | 3 refills | Status: DC | PRN

## 2017-09-18 NOTE — Progress Notes (Signed)
Patient: Mary Mosley, Female    DOB: 20-Jun-1941, 76 y.o.   MRN: [id number] Visit Date: 09/18/2017  Today's Provider: Mila Merry, MD   Chief Complaint  Patient presents with  . Annual Exam  . Insomnia    follow up   Subjective:    Annual physical exam Mary Mosley is a 76 y.o. female who presents today for health maintenance and complete physical. She feels well. She reports exercising daily. She reports she is sleeping poorly.  ----------------------------------------------------------------- Follow up of Osteopenia:  Patient was last seen for this problem 1 year ago and no changes were made. Patient was advised to continue Vitamin D supplementation. Today patient reports good compliance with treatment and good tolerance.  Follow up of Insomnia:  Patient was last seen for this problem 1 year ago and no changes were made. Patient reports good compliance with treatment, good tolerance and fair symptom control. She takes 1/2 tablet lorazepam a few nights a week when she is unable to fall asleep after a few hours. Patient states she still has some problems falling asleep in her own without the medication. Patient reports she sleep an average of 6 hours each night.   Review of Systems  Constitutional: Negative for chills, fatigue and fever.  HENT: Negative for congestion, ear pain, rhinorrhea, sneezing and sore throat.   Eyes: Negative.  Negative for pain and redness.  Respiratory: Negative for cough, shortness of breath and wheezing.   Cardiovascular: Negative for chest pain and leg swelling.  Gastrointestinal: Negative for abdominal pain, blood in stool, constipation, diarrhea and nausea.  Endocrine: Negative for polydipsia and polyphagia.  Genitourinary: Negative.  Negative for dysuria, flank pain, hematuria, pelvic pain, vaginal bleeding and vaginal discharge.  Musculoskeletal: Negative for arthralgias, back pain, gait problem and joint swelling.  Skin: Negative for  rash.  Allergic/Immunologic: Positive for environmental allergies.  Neurological: Positive for headaches. Negative for dizziness, tremors, seizures, weakness, light-headedness and numbness.  Hematological: Negative for adenopathy.  Psychiatric/Behavioral: Negative.  Negative for behavioral problems, confusion and dysphoric mood. The patient is not nervous/anxious and is not hyperactive.     Social History      She  reports that she has never smoked. She has never used smokeless tobacco. She reports that she does not drink alcohol or use drugs.       Social History   Social History  . Marital status: Widowed    Spouse name: N/A  . Number of children: 2  . Years of education: N/A   Occupational History  . Retired    Social History Main Topics  . Smoking status: Never Smoker  . Smokeless tobacco: Never Used  . Alcohol use No  . Drug use: No  . Sexual activity: Not Asked   Other Topics Concern  . None   Social History Narrative  . None    Past Medical History:  Diagnosis Date  . OSA (obstructive sleep apnea)   . Osteopenia      Patient Active Problem List   Diagnosis Date Noted  . Breast calcification, right 09/03/2015  . Myalgia 09/03/2015  . Osteopenia 08/01/2011  . Edema 03/12/2010  . Insomnia 01/01/2009  . OSA (obstructive sleep apnea) 07/24/2008    Past Surgical History:  Procedure Laterality Date  . EYE SURGERY Bilateral    due to trauma  . TONSILLECTOMY AND ADENOIDECTOMY      Family History        Family Status  Relation  Status  . Mother Deceased       MI  . Brother Deceased       Lung Cancer  . Brother Deceased  . Brother Deceased  . Father Deceased       Complications from traffic accident  . Son Alive  . Son Deceased  . Brother Alive  . Neg Hx (Not Specified)        Her family history includes Heart Problems in her brother and brother; Heart attack in her mother; Liver cancer in her son; Lung cancer in her brother.     Allergies    Allergen Reactions  . Penicillins      Current Outpatient Prescriptions:  .  cholecalciferol (VITAMIN D) 1000 UNITS tablet, Take 2 tablets by mouth daily., Disp: , Rfl:  .  LORazepam (ATIVAN) 1 MG tablet, Take 1 tablet (1 mg total) by mouth at bedtime as needed., Disp: 30 tablet, Rfl: 3   Patient Care Team: Malva LimesFisher, Donald E, MD as PCP - General (Family Medicine) [place] CraneKing, Bradley Mark, MD as Consulting Physician (Ophthalmology)      Objective:   Vitals: BP 140/70 (BP Location: Left Arm, Patient Position: Sitting, Cuff Size: Normal)   Pulse 80   Temp 97.8 F (36.6 C) (Oral)   Resp 16   Ht 5\' 6"  (1.676 m)   Wt 124 lb (56.2 kg)   SpO2 99% Comment: room air  BMI 20.01 kg/m   There were no vitals filed for this visit.   Physical Exam   General Appearance:    Alert, cooperative, no distress, appears stated age  Head:    Normocephalic, without obvious abnormality, atraumatic  Eyes:    PERRL, conjunctiva/corneas clear, EOM's intact, fundi    benign, both eyes  Ears:    Normal TM's and external ear canals, both ears  Nose:   Nares normal, septum midline, mucosa normal, no drainage    or sinus tenderness  Throat:   Lips, mucosa, and tongue normal; teeth and gums normal  Neck:   Supple, symmetrical, trachea midline, no adenopathy;    thyroid:  no enlargement/tenderness/nodules; no carotid   bruit or JVD  Back:     Symmetric, no curvature, ROM normal, no CVA tenderness  Lungs:     Clear to auscultation bilaterally, respirations unlabored  Chest Wall:    No tenderness or deformity   Heart:    Regular rate and rhythm, S1 and S2 normal, no murmur, rub   or gallop  Breast Exam:    normal appearance, no masses or tenderness, deferred  Abdomen:     Soft, non-tender, bowel sounds active all four quadrants,    no masses, no organomegaly  Pelvic:    deferred  Extremities:   Extremities normal, atraumatic, no cyanosis or edema  Pulses:   2+ and symmetric all extremities  Skin:   Skin  color, texture, turgor normal, no rashes or lesions  Lymph nodes:   Cervical, supraclavicular, and axillary nodes normal  Neurologic:   CNII-XII intact, normal strength, sensation and reflexes    throughout    Depression Screen PHQ 2/9 Scores 07/21/2017 09/15/2016 09/07/2015  PHQ - 2 Score 0 0 0  PHQ- 9 Score - 0 1      Assessment & Plan:     Routine Health Maintenance and Physical Exam  Exercise Activities and Dietary recommendations Goals    . Increase water intake          Recommend increasing water intake to 6-8 glasses  of water a day.        Immunization History  Administered Date(s) Administered  . Influenza, High Dose Seasonal PF 09/07/2015, 09/15/2016, 07/21/2017  . Pneumococcal Conjugate-13 09/04/2014  . Pneumococcal Polysaccharide-23 01/08/2004    Health Maintenance  Topic Date Due  . TETANUS/TDAP  11/21/2026 (Originally 01/07/1960)  . INFLUENZA VACCINE  Completed  . DEXA SCAN  Completed  . PNA vac Low Risk Adult  Completed     Discussed health benefits of physical activity, and encouraged her to engage in regular exercise appropriate for her age and condition.    --------------------------------------------------------------------  1. Annual physical exam Generally doing well. Tolerating low dose lorazepam occasionally.  2. Osteopenia, unspecified location BMD in 2019  3. Screening for diabetes mellitus  - POCT Glucose (CBG)    Mila Merry, MD  [facility]

## 2018-02-16 ENCOUNTER — Other Ambulatory Visit: Payer: Self-pay | Admitting: Family Medicine

## 2018-02-16 MED ORDER — LORAZEPAM 1 MG PO TABS: 1.0000 mg | ORAL_TABLET | Freq: Every evening | ORAL | 3 refills | Status: DC | PRN

## 2018-02-16 NOTE — Telephone Encounter (Signed)
Patient requesting refills of Lorazepam 1 mg. To [facility] on [address]

## 2018-04-19 ENCOUNTER — Other Ambulatory Visit: Payer: Self-pay | Admitting: Family Medicine

## 2018-04-19 DIAGNOSIS — Z1231 Encounter for screening mammogram for malignant neoplasm of breast: Secondary | ICD-10-CM

## 2018-05-28 ENCOUNTER — Ambulatory Visit
Admission: RE | Admit: 2018-05-28 | Discharge: 2018-05-28 | Disposition: A | Discharge status: Home / Self Care | Payer: Medicare Other | Source: Ambulatory Visit | Attending: Family Medicine | Admitting: Family Medicine

## 2018-05-28 DIAGNOSIS — Z1231 Encounter for screening mammogram for malignant neoplasm of breast: Secondary | ICD-10-CM | POA: Diagnosis not present

## 2018-07-18 DIAGNOSIS — H2513 Age-related nuclear cataract, bilateral: Secondary | ICD-10-CM | POA: Diagnosis not present

## 2018-07-24 ENCOUNTER — Ambulatory Visit (INDEPENDENT_AMBULATORY_CARE_PROVIDER_SITE_OTHER): Payer: Medicare Other

## 2018-07-24 VITALS — BP 136/64 | HR 60 | Temp 98.6°F | Ht 66.0 in | Wt 127.6 lb

## 2018-07-24 DIAGNOSIS — Z0000 Encounter for general adult medical examination without abnormal findings: Secondary | ICD-10-CM | POA: Diagnosis not present

## 2018-07-24 DIAGNOSIS — Z23 Encounter for immunization: Secondary | ICD-10-CM

## 2018-07-24 NOTE — Progress Notes (Signed)
Subjective:   Mary Mosley is a 77 y.o. female who presents for Medicare Annual (Subsequent) preventive examination.  Review of Systems:  N/A  Cardiac Risk Factors include: advanced age (>54men, >26 women)     Objective:     Vitals: BP 136/64 (BP Location: Right Arm)   Pulse 60   Temp 98.6 F (37 C) (Oral)   Ht 5\' 6"  (1.676 m)   Wt 127 lb 9.6 oz (57.9 kg)   BMI 20.60 kg/m   Body mass index is 20.6 kg/m.  Advanced Directives 07/24/2018 07/21/2017  Does Patient Have a Medical Advance Directive? Yes Yes  Type of Estate agent of [place];Living will Healthcare Power of [place];Living will  Copy of Healthcare Power of Attorney in Chart? No - copy requested No - copy requested    Tobacco Social History   Tobacco Use  Smoking Status Never Smoker  Smokeless Tobacco Never Used     Counseling given: Not Answered   Clinical Intake:  Pre-visit preparation completed: Yes  Pain : No/denies pain Pain Score: 0-No pain     Nutritional Status: BMI of 19-24  Normal Nutritional Risks: None Diabetes: No  How often do you need to have someone help you when you read instructions, pamphlets, or other written materials from your doctor or pharmacy?: 1 - Never  Interpreter Needed?: No  Information entered by :: [facility], LPN  Past Medical History:  Diagnosis Date  . OSA (obstructive sleep apnea)   . Osteopenia    Past Surgical History:  Procedure Laterality Date  . EYE SURGERY Bilateral    due to trauma  . TONSILLECTOMY AND ADENOIDECTOMY     Family History  Problem Relation Age of Onset  . Heart attack Mother   . Lung cancer Brother   . Heart Problems Brother   . Heart Problems Brother        Has stents  . Liver cancer Son   . Breast cancer Neg Hx    Social History   Socioeconomic History  . Marital status: Widowed    Spouse name: Not on file  . Number of children: 2  . Years of education: Not on file  . Highest education level:  Some college, no degree  Occupational History  . Occupation: Retired  Engineer, production  . Financial resource strain: Not hard at all  . Food insecurity:    Worry: Never true    Inability: Never true  . Transportation needs:    Medical: No    Non-medical: No  Tobacco Use  . Smoking status: Never Smoker  . Smokeless tobacco: Never Used  Substance and Sexual Activity  . Alcohol use: No    Alcohol/week: 0.0 standard drinks  . Drug use: No  . Sexual activity: Not on file  Lifestyle  . Physical activity:    Days per week: Not on file    Minutes per session: Not on file  . Stress: Not at all  Relationships  . Social connections:    Talks on phone: Not on file    Gets together: Not on file    Attends religious service: Not on file    Active member of club or organization: Not on file    Attends meetings of clubs or organizations: Not on file    Relationship status: Not on file  Other Topics Concern  . Not on file  Social History Narrative  . Not on file    Outpatient Encounter Medications as of 07/24/2018  Medication Sig  . cholecalciferol (VITAMIN D) 1000 UNITS tablet Take 2 tablets by mouth daily.  Marland Kitchen LORazepam (ATIVAN) 1 MG tablet Take 1 tablet (1 mg total) by mouth at bedtime as needed.   No facility-administered encounter medications on file as of 07/24/2018.     Activities of Daily Living In your present state of health, do you have any difficulty performing the following activities: 07/24/2018  Hearing? N  Vision? N  Difficulty concentrating or making decisions? N  Walking or climbing stairs? N  Dressing or bathing? N  Doing errands, shopping? N  Preparing Food and eating ? N  Using the Toilet? N  Managing your Medications? N  Managing your Finances? N  Housekeeping or managing your Housekeeping? N  Some recent data might be hidden    Patient Care Team: Malva Limes, MD as PCP - General (Family Medicine) [place] Crane, MD as Consulting Physician  (Ophthalmology)    Assessment:   This is a routine wellness examination for [place].  Exercise Activities and Dietary recommendations Current Exercise Habits: Structured exercise class, Type of exercise: Other - see comments(sit down eliptical), Time (Minutes): 45(to 1 hour), Frequency (Times/Week): 3, Weekly Exercise (Minutes/Week): 135, Intensity: Mild, Exercise limited by: None identified  Goals    . Increase water intake     Recommend increasing water intake to 6-8 glasses of water a day.        Fall Risk Fall Risk  07/24/2018 07/21/2017 09/15/2016 09/07/2015  Falls in the past year? No No No No   Is the patient's home free of loose throw rugs in walkways, pet beds, electrical cords, etc?   yes      Grab bars in the bathroom? yes      Handrails on the stairs? yes      Adequate lighting?   yes  Timed Get Up and Go performed: N/A  Depression Screen PHQ 2/9 Scores 07/24/2018 07/21/2017 09/15/2016 09/07/2015  PHQ - 2 Score 0 0 0 0  PHQ- 9 Score - - 0 1     Cognitive Function: Pt declined today and states she completed this a couple of months ago with [facility] nurse and she passed with a 100.     6CIT Screen 07/21/2017  What Year? 0 points  What month? 0 points  What time? 0 points  Count back from 20 0 points  Months in reverse 0 points  Repeat phrase 4 points  Total Score 4    Immunization History  Administered Date(s) Administered  . Influenza, High Dose Seasonal PF 09/07/2015, 09/15/2016, 07/21/2017  . Pneumococcal Conjugate-13 09/04/2014  . Pneumococcal Polysaccharide-23 01/08/2004    Qualifies for Shingles Vaccine? Due for Shingles vaccine. Declined my offer to administer today. Education has been provided regarding the importance of this vaccine. Pt has been advised to call her insurance company to determine her out of pocket expense. Advised she may also receive this vaccine at her local pharmacy or Health Dept. Verbalized acceptance and understanding.  Screening  Tests Health Maintenance  Topic Date Due  . TETANUS/TDAP  11/21/2026 (Originally 01/07/1960)  . INFLUENZA VACCINE  Completed  . DEXA SCAN  Completed  . PNA vac Low Risk Adult  Completed    Cancer Screenings: Lung: Low Dose CT Chest recommended if Age 25-80 years, 30 pack-year currently smoking OR have quit w/in 15years. Patient does not qualify. Breast:  Up to date on Mammogram? Yes   Up to date of Bone Density/Dexa? Yes Colorectal: Up to date  Additional Screenings:  Hepatitis C Screening: N/A     Plan:  I have personally reviewed and addressed the Medicare Annual Wellness questionnaire and have noted the following in the patient's chart:  A. Medical and social history B. Use of alcohol, tobacco or illicit drugs  C. Current medications and supplements D. Functional ability and status E.  Nutritional status F.  Physical activity G. Advance directives H. List of other physicians I.  Hospitalizations, surgeries, and ER visits in previous 12 months J.  Vitals K. Screenings such as hearing and vision if needed, cognitive and depression L. Referrals and appointments - none  In addition, I have reviewed and discussed with patient certain preventive protocols, quality metrics, and best practice recommendations. A written personalized care plan for preventive services as well as general preventive health recommendations were provided to patient.  See attached scanned questionnaire for additional information.   Signed,  Hyacinth Meeker, LPN Nurse Health Advisor   Nurse Recommendations: Pt declined the tetanus vaccine today.

## 2018-07-24 NOTE — Patient Instructions (Signed)
[name] , Thank you for taking time to come for your Medicare Wellness Visit. I appreciate your ongoing commitment to your health goals. Please review the following plan we discussed and let me know if I can assist you in the future.   Screening recommendations/referrals: Colonoscopy: Up to date Mammogram: Up to date Bone Density: Up to date Recommended yearly ophthalmology/optometry visit for glaucoma screening and checkup Recommended yearly dental visit for hygiene and checkup  Vaccinations: Influenza vaccine: Up to date Pneumococcal vaccine: Up to date Tdap vaccine: Pt declines today.  Shingles vaccine: Pt declines today.      Advanced directives: Please bring a copy of your POA (Power of Attorney) and/or Living Will to your next appointment.   Conditions/risks identified: None.   Next appointment: 09/17/18 @ 9 AM with Mila Merry.    Preventive Care 66 Years and Older, Female Preventive care refers to lifestyle choices and visits with your health care provider that can promote health and wellness. What does preventive care include?  A yearly physical exam. This is also called an annual well check.  Dental exams once or twice a year.  Routine eye exams. Ask your health care provider how often you should have your eyes checked.  Personal lifestyle choices, including:  Daily care of your teeth and gums.  Regular physical activity.  Eating a healthy diet.  Avoiding tobacco and drug use.  Limiting alcohol use.  Practicing safe sex.  Taking low-dose aspirin every day.  Taking vitamin and mineral supplements as recommended by your health care provider. What happens during an annual well check? The services and screenings done by your health care provider during your annual well check will depend on your age, overall health, lifestyle risk factors, and family history of disease. Counseling  Your health care provider may ask you questions about your:  Alcohol  use.  Tobacco use.  Drug use.  Emotional well-being.  Home and relationship well-being.  Sexual activity.  Eating habits.  History of falls.  Memory and ability to understand (cognition).  Work and work Astronomer.  Reproductive health. Screening  You may have the following tests or measurements:  Height, weight, and BMI.  Blood pressure.  Lipid and cholesterol levels. These may be checked every 5 years, or more frequently if you are over 34 years old.  Skin check.  Lung cancer screening. You may have this screening every year starting at age 29 if you have a 30-pack-year history of smoking and currently smoke or have quit within the past 15 years.  Fecal occult blood test (FOBT) of the stool. You may have this test every year starting at age 60.  Flexible sigmoidoscopy or colonoscopy. You may have a sigmoidoscopy every 5 years or a colonoscopy every 10 years starting at age 82.  Hepatitis C blood test.  Hepatitis B blood test.  Sexually transmitted disease (STD) testing.  Diabetes screening. This is done by checking your blood sugar (glucose) after you have not eaten for a while (fasting). You may have this done every 1-3 years.  Bone density scan. This is done to screen for osteoporosis. You may have this done starting at age 35.  Mammogram. This may be done every 1-2 years. Talk to your health care provider about how often you should have regular mammograms. Talk with your health care provider about your test results, treatment options, and if necessary, the need for more tests. Vaccines  Your health care provider may recommend certain vaccines, such as:  Influenza vaccine. This is recommended every year.  Tetanus, diphtheria, and acellular pertussis (Tdap, Td) vaccine. You may need a Td booster every 10 years.  Zoster vaccine. You may need this after age 17.  Pneumococcal 13-valent conjugate (PCV13) vaccine. One dose is recommended after age  79.  Pneumococcal polysaccharide (PPSV23) vaccine. One dose is recommended after age 79. Talk to your health care provider about which screenings and vaccines you need and how often you need them. This information is not intended to replace advice given to you by your health care provider. Make sure you discuss any questions you have with your health care provider. Document Released: 12/04/2015 Document Revised: 07/27/2016 Document Reviewed: 09/08/2015 Elsevier Interactive Patient Education  2017 [place] Prevention in the Home Falls can cause injuries. They can happen to people of all ages. There are many things you can do to make your home safe and to help prevent falls. What can I do on the outside of my home?  Regularly fix the edges of walkways and driveways and fix any cracks.  Remove anything that might make you trip as you walk through a door, such as a raised step or threshold.  Trim any bushes or trees on the path to your home.  Use bright outdoor lighting.  Clear any walking paths of anything that might make someone trip, such as rocks or tools.  Regularly check to see if handrails are loose or broken. Make sure that both sides of any steps have handrails.  Any raised decks and porches should have guardrails on the edges.  Have any leaves, snow, or ice cleared regularly.  Use sand or salt on walking paths during winter.  Clean up any spills in your garage right away. This includes oil or grease spills. What can I do in the bathroom?  Use night lights.  Install grab bars by the toilet and in the tub and shower. Do not use towel bars as grab bars.  Use non-skid mats or decals in the tub or shower.  If you need to sit down in the shower, use a plastic, non-slip stool.  Keep the floor dry. Clean up any water that spills on the floor as soon as it happens.  Remove soap buildup in the tub or shower regularly.  Attach bath mats securely with double-sided  non-slip rug tape.  Do not have throw rugs and other things on the floor that can make you trip. What can I do in the bedroom?  Use night lights.  Make sure that you have a light by your bed that is easy to reach.  Do not use any sheets or blankets that are too big for your bed. They should not hang down onto the floor.  Have a firm chair that has side arms. You can use this for support while you get dressed.  Do not have throw rugs and other things on the floor that can make you trip. What can I do in the kitchen?  Clean up any spills right away.  Avoid walking on wet floors.  Keep items that you use a lot in easy-to-reach places.  If you need to reach something above you, use a strong step stool that has a grab bar.  Keep electrical cords out of the way.  Do not use floor polish or wax that makes floors slippery. If you must use wax, use non-skid floor wax.  Do not have throw rugs and other things on the floor that  can make you trip. What can I do with my stairs?  Do not leave any items on the stairs.  Make sure that there are handrails on both sides of the stairs and use them. Fix handrails that are broken or loose. Make sure that handrails are as long as the stairways.  Check any carpeting to make sure that it is firmly attached to the stairs. Fix any carpet that is loose or worn.  Avoid having throw rugs at the top or bottom of the stairs. If you do have throw rugs, attach them to the floor with carpet tape.  Make sure that you have a light switch at the top of the stairs and the bottom of the stairs. If you do not have them, ask someone to add them for you. What else can I do to help prevent falls?  Wear shoes that:  Do not have high heels.  Have rubber bottoms.  Are comfortable and fit you well.  Are closed at the toe. Do not wear sandals.  If you use a stepladder:  Make sure that it is fully opened. Do not climb a closed stepladder.  Make sure that both  sides of the stepladder are locked into place.  Ask someone to hold it for you, if possible.  Clearly mark and make sure that you can see:  Any grab bars or handrails.  First and last steps.  Where the edge of each step is.  Use tools that help you move around (mobility aids) if they are needed. These include:  Canes.  Walkers.  Scooters.  Crutches.  Turn on the lights when you go into a dark area. Replace any light bulbs as soon as they burn out.  Set up your furniture so you have a clear path. Avoid moving your furniture around.  If any of your floors are uneven, fix them.  If there are any pets around you, be aware of where they are.  Review your medicines with your doctor. Some medicines can make you feel dizzy. This can increase your chance of falling. Ask your doctor what other things that you can do to help prevent falls. This information is not intended to replace advice given to you by your health care provider. Make sure you discuss any questions you have with your health care provider. Document Released: 09/03/2009 Document Revised: 04/14/2016 Document Reviewed: 12/12/2014 Elsevier Interactive Patient Education  2017 [employer].

## 2018-09-17 ENCOUNTER — Encounter: Payer: Self-pay | Admitting: Family Medicine

## 2018-09-17 ENCOUNTER — Ambulatory Visit (INDEPENDENT_AMBULATORY_CARE_PROVIDER_SITE_OTHER): Payer: Medicare Other | Admitting: Family Medicine

## 2018-09-17 VITALS — BP 138/68 | HR 72 | Temp 97.4°F | Ht 66.0 in | Wt 126.0 lb

## 2018-09-17 DIAGNOSIS — M8580 Other specified disorders of bone density and structure, unspecified site: Secondary | ICD-10-CM

## 2018-09-17 DIAGNOSIS — Z1239 Encounter for other screening for malignant neoplasm of breast: Secondary | ICD-10-CM | POA: Diagnosis not present

## 2018-09-17 DIAGNOSIS — Z0000 Encounter for general adult medical examination without abnormal findings: Secondary | ICD-10-CM | POA: Diagnosis not present

## 2018-09-17 DIAGNOSIS — G4700 Insomnia, unspecified: Secondary | ICD-10-CM | POA: Diagnosis not present

## 2018-09-17 DIAGNOSIS — E2839 Other primary ovarian failure: Secondary | ICD-10-CM

## 2018-09-17 MED ORDER — LORAZEPAM 1 MG PO TABS: 1.0000 mg | ORAL_TABLET | Freq: Every evening | ORAL | 3 refills | Status: DC | PRN

## 2018-09-17 NOTE — Patient Instructions (Signed)
   The CDC recommends two doses of Shingrix (the shingles vaccine) separated by 2 to 6 months for adults age 79 years and older. I recommend checking with your insurance plan regarding coverage for this vaccine.   

## 2018-09-17 NOTE — Progress Notes (Signed)
Patient: Mary Mosley, Female    DOB: [birth year], 76 y.o.   MRN: [id number] Visit Date: 09/17/2018  Today's Provider: Mila Merry, MD   No chief complaint on file.  Subjective:     Complete Physical Mary Mosley is a 77 y.o. female. She feels well. She reports exercising daily. She reports she is sleeping well. She occasionally takes 1/2 lorazepam to help get to sleep. No adverse effects from medications.   She continues on vitamin d supplement daily for osteopenia. Otherwise feels well.   -----------------------------------------------------------   Review of Systems  Constitutional: Negative.   Eyes: Negative.   Respiratory: Negative.   Cardiovascular: Negative.   Gastrointestinal: Negative.   Endocrine: Negative.   Genitourinary: Negative.   Musculoskeletal: Negative.   Skin: Negative.   Allergic/Immunologic: Negative.   Neurological: Negative.   Hematological: Negative.   Psychiatric/Behavioral: Positive for sleep disturbance. Negative for agitation, behavioral problems, confusion, decreased concentration, dysphoric mood, hallucinations, self-injury and suicidal ideas. The patient is not nervous/anxious and is not hyperactive.     Social History   Socioeconomic History  . Marital status: Widowed    Spouse name: Not on file  . Number of children: 2  . Years of education: Not on file  . Highest education level: Some college, no degree  Occupational History  . Occupation: Retired  Engineer, production  . Financial resource strain: Not hard at all  . Food insecurity:    Worry: Never true    Inability: Never true  . Transportation needs:    Medical: No    Non-medical: No  Tobacco Use  . Smoking status: Never Smoker  . Smokeless tobacco: Never Used  Substance and Sexual Activity  . Alcohol use: No    Alcohol/week: 0.0 standard drinks  . Drug use: No  . Sexual activity: Not on file  Lifestyle  . Physical activity:    Days per week: Not on file   Minutes per session: Not on file  . Stress: Not at all  Relationships  . Social connections:    Talks on phone: Not on file    Gets together: Not on file    Attends religious service: Not on file    Active member of club or organization: Not on file    Attends meetings of clubs or organizations: Not on file    Relationship status: Not on file  . Intimate partner violence:    Fear of current or ex partner: Not on file    Emotionally abused: Not on file    Physically abused: Not on file    Forced sexual activity: Not on file  Other Topics Concern  . Not on file  Social History Narrative  . Not on file    Past Medical History:  Diagnosis Date  . OSA (obstructive sleep apnea)   . Osteopenia      Patient Active Problem List   Diagnosis Date Noted  . Breast calcification, right 09/03/2015  . Myalgia 09/03/2015  . Osteopenia 08/01/2011  . Edema 03/12/2010  . Insomnia 01/01/2009  . OSA (obstructive sleep apnea) 07/24/2008    Past Surgical History:  Procedure Laterality Date  . EYE SURGERY Bilateral    due to trauma  . TONSILLECTOMY AND ADENOIDECTOMY      Her family history includes Heart Problems in her brother and brother; Heart attack in her mother; Liver cancer in her son; Lung cancer in her brother. There is no history of Breast cancer.  Current Outpatient Medications:  .  cholecalciferol (VITAMIN D) 1000 UNITS tablet, Take 2 tablets by mouth daily., Disp: , Rfl:  .  LORazepam (ATIVAN) 1 MG tablet, Take 1 tablet (1 mg total) by mouth at bedtime as needed., Disp: 30 tablet, Rfl: 3  Patient Care Team: Malva Limes, MD as PCP - General (Family Medicine) [place] Crane, MD as Consulting Physician (Ophthalmology)     Objective:   Vitals: BP 138/68 (BP Location: Right Arm, Patient Position: Sitting, Cuff Size: Normal)   Pulse 72   Temp (!) 97.4 F (36.3 C) (Oral)   Ht 5\' 6"  (1.676 m)   Wt 126 lb (57.2 kg)   BMI 20.34 kg/m   Physical  Exam   General Appearance:    Alert, cooperative, no distress, appears stated age  Head:    Normocephalic, without obvious abnormality, atraumatic  Eyes:    PERRL, conjunctiva/corneas clear, EOM's intact, fundi    benign, both eyes  Ears:    Normal TM's and external ear canals, both ears  Nose:   Nares normal, septum midline, mucosa normal, no drainage    or sinus tenderness  Throat:   Lips, mucosa, and tongue normal; teeth and gums normal  Neck:   Supple, symmetrical, trachea midline, no adenopathy;    thyroid:  no enlargement/tenderness/nodules; no carotid   bruit or JVD  Back:     Symmetric, no curvature, ROM normal, no CVA tenderness  Lungs:     Clear to auscultation bilaterally, respirations unlabored  Chest Wall:    No tenderness or deformity   Heart:    Regular rate and rhythm, S1 and S2 normal, no murmur, rub   or gallop  Breast Exam:    normal appearance, no masses or tenderness  Abdomen:     Soft, non-tender, bowel sounds active all four quadrants,    no masses, no organomegaly  Pelvic:    deferred  Extremities:   Extremities normal, atraumatic, no cyanosis or edema  Pulses:   2+ and symmetric all extremities  Skin:   Skin color, texture, turgor normal, no rashes or lesions  Lymph nodes:   Cervical, supraclavicular, and axillary nodes normal  Neurologic:   CNII-XII intact, normal strength, sensation and reflexes    throughout    Activities of Daily Living In your present state of health, do you have any difficulty performing the following activities: 07/24/2018  Hearing? N  Vision? N  Difficulty concentrating or making decisions? N  Walking or climbing stairs? N  Dressing or bathing? N  Doing errands, shopping? N  Preparing Food and eating ? N  Using the Toilet? N  Managing your Medications? N  Managing your Finances? N  Housekeeping or managing your Housekeeping? N  Some recent data might be hidden    Fall Risk Assessment Fall Risk  07/24/2018 07/21/2017  09/15/2016 09/07/2015  Falls in the past year? No No No No     Depression Screen PHQ 2/9 Scores 07/24/2018 07/21/2017 09/15/2016 09/07/2015  PHQ - 2 Score 0 0 0 0  PHQ- 9 Score - - 0 1     Assessment & Plan:    Annual Physical Reviewed patient's Family Medical History Reviewed and updated list of patient's medical providers Assessment of cognitive impairment was done Assessed patient's functional ability Established a written schedule for health screening services Health Risk Assessent Completed and Reviewed  Exercise Activities and Dietary recommendations Goals    . Increase water intake     Recommend increasing  water intake to 6-8 glasses of water a day.        Immunization History  Administered Date(s) Administered  . Influenza, High Dose Seasonal PF 09/07/2015, 09/15/2016, 07/21/2017, 07/24/2018  . Pneumococcal Conjugate-13 09/04/2014  . Pneumococcal Polysaccharide-23 01/08/2004    Health Maintenance  Topic Date Due  . TETANUS/TDAP  11/21/2026 (Originally 01/07/1960)  . INFLUENZA VACCINE  Completed  . DEXA SCAN  Completed  . PNA vac Low Risk Adult  Completed     Discussed health benefits of physical activity, and encouraged her to engage in regular exercise appropriate for her age and condition.    ------------------------------------------------------------------------------------------------------------  1. Annual physical exam  - Basic metabolic panel  2. Estrogen deficiency  - DG Bone Density; Future - Basic metabolic panel  3. Breast cancer screening Normal exam   4. Insomnia, unspecified type refill - LORazepam (ATIVAN) 1 MG tablet; Take 1 tablet (1 mg total) by mouth at bedtime as needed.  Dispense: 30 tablet; Refill: 3   5. Osteopenia, unspecified location  - VITAMIN D 25 Hydroxy (Vit-D Deficiency, Fractures) - Basic metabolic panel   Mila Merry, MD  [facility]

## 2018-09-18 ENCOUNTER — Telehealth: Payer: Self-pay

## 2018-09-18 LAB — BASIC METABOLIC PANEL
BUN/Creatinine Ratio: 12 (ref 12–28)
BUN: 9 mg/dL (ref 8–27)
CALCIUM: 10.5 mg/dL — AB (ref 8.7–10.3)
CO2: 24 mmol/L (ref 20–29)
Chloride: 99 mmol/L (ref 96–106)
Creatinine, Ser: 0.78 mg/dL (ref 0.57–1.00)
GFR calc Af Amer: 84 mL/min/{1.73_m2} (ref >59)
GFR, EST NON AFRICAN AMERICAN: 73 mL/min/{1.73_m2} (ref >59)
Glucose: 92 mg/dL (ref 65–99)
POTASSIUM: 4.8 mmol/L (ref 3.5–5.2)
Sodium: 140 mmol/L (ref 134–144)

## 2018-09-18 LAB — VITAMIN D 25 HYDROXY (VIT D DEFICIENCY, FRACTURES): Vit D, 25-Hydroxy: 56.4 ng/mL (ref 30.0–100.0)

## 2018-09-18 NOTE — Telephone Encounter (Signed)
-----   Message from Malva Limes, MD sent at 09/18/2018 10:06 AM EDT ----- Vitamin d levels are very good.  Rest of labs are very good. Continue current medications.  Check yearly.

## 2018-09-18 NOTE — Telephone Encounter (Signed)
LVMTRC.,PC 

## 2018-09-18 NOTE — Telephone Encounter (Signed)
Pt returned Porsha's call. °Please call pt back. ° °Thanks, °[facility] ° °

## 2018-09-19 NOTE — Telephone Encounter (Signed)
Pt advised.   Thanks,   -Anjelica Gorniak  

## 2018-10-29 ENCOUNTER — Ambulatory Visit
Admission: RE | Admit: 2018-10-29 | Discharge: 2018-10-29 | Disposition: A | Discharge status: Home / Self Care | Payer: Medicare Other | Source: Ambulatory Visit | Attending: Family Medicine | Admitting: Family Medicine

## 2018-10-29 ENCOUNTER — Telehealth: Payer: Self-pay

## 2018-10-29 DIAGNOSIS — M8589 Other specified disorders of bone density and structure, multiple sites: Secondary | ICD-10-CM | POA: Diagnosis not present

## 2018-10-29 DIAGNOSIS — Z780 Asymptomatic menopausal state: Secondary | ICD-10-CM | POA: Diagnosis not present

## 2018-10-29 DIAGNOSIS — E2839 Other primary ovarian failure: Secondary | ICD-10-CM | POA: Insufficient documentation

## 2018-10-29 MED ORDER — ALENDRONATE SODIUM 70 MG PO TABS: 70.0000 mg | ORAL_TABLET | ORAL | 11 refills | Status: DC

## 2018-10-29 NOTE — Telephone Encounter (Signed)
Patient returned your call. KW 

## 2018-10-29 NOTE — Telephone Encounter (Signed)
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 Need to start alendronate 70mg  once a week, #4 rf x 12. Call if any problems or side effects of medication.

## 2018-10-29 NOTE — Telephone Encounter (Signed)
Pt advised.  [facility] [address]   Thanks,   -Vernona RiegerLaura

## 2018-10-29 NOTE — Telephone Encounter (Signed)
LMTCB 10/29/2018  Thanks,   -Aayush Gelpi  

## 2019-01-21 ENCOUNTER — Telehealth: Payer: Self-pay | Admitting: Family Medicine

## 2019-01-21 NOTE — Telephone Encounter (Signed)
Pt had to stop taking the Flonase 70 mg.  It is making her itch. No rash. Asking what else can be prescribed for her?  Thanks, [employer]

## 2019-01-21 NOTE — Telephone Encounter (Signed)
Pt advised.  She will stop Fosamax for now and continue with Vitamin D.   Thanks,   -Vernona Rieger

## 2019-01-21 NOTE — Telephone Encounter (Signed)
I think she means the Fosamax. She can stop taking it. Keep taking vitamin d. Can hold off on starting new medication for now. Will need to recheck BMD next year. If gets worse will need referral for Prolia injections.

## 2019-03-11 ENCOUNTER — Other Ambulatory Visit: Payer: Self-pay | Admitting: Family Medicine

## 2019-03-11 DIAGNOSIS — G4700 Insomnia, unspecified: Secondary | ICD-10-CM

## 2019-03-11 NOTE — Telephone Encounter (Signed)
Pt needs refill on Lorazepam 1 mg  [facility] [address]  [employer]

## 2019-03-12 MED ORDER — LORAZEPAM 1 MG PO TABS: 1.0000 mg | ORAL_TABLET | Freq: Every evening | ORAL | 3 refills | Status: DC | PRN

## 2019-03-12 NOTE — Telephone Encounter (Signed)
Please review. Thanks!  

## 2019-04-29 ENCOUNTER — Other Ambulatory Visit: Payer: Self-pay | Admitting: Family Medicine

## 2019-04-29 DIAGNOSIS — Z1231 Encounter for screening mammogram for malignant neoplasm of breast: Secondary | ICD-10-CM

## 2019-06-10 ENCOUNTER — Other Ambulatory Visit: Payer: Self-pay

## 2019-06-10 ENCOUNTER — Ambulatory Visit
Admission: RE | Admit: 2019-06-10 | Discharge: 2019-06-10 | Disposition: A | Discharge status: Home / Self Care | Payer: Medicare Other | Source: Ambulatory Visit | Attending: Family Medicine | Admitting: Family Medicine

## 2019-06-10 DIAGNOSIS — Z1231 Encounter for screening mammogram for malignant neoplasm of breast: Secondary | ICD-10-CM | POA: Insufficient documentation

## 2019-07-23 ENCOUNTER — Encounter: Payer: Self-pay | Admitting: Physician Assistant

## 2019-07-23 ENCOUNTER — Ambulatory Visit (INDEPENDENT_AMBULATORY_CARE_PROVIDER_SITE_OTHER): Payer: Medicare Other | Admitting: Physician Assistant

## 2019-07-23 VITALS — Wt 120.0 lb

## 2019-07-23 DIAGNOSIS — S39012A Strain of muscle, fascia and tendon of lower back, initial encounter: Secondary | ICD-10-CM | POA: Diagnosis not present

## 2019-07-23 MED ORDER — PREDNISONE 10 MG (21) PO TBPK: ORAL_TABLET | ORAL | 0 refills | Status: DC

## 2019-07-23 MED ORDER — BACLOFEN 10 MG PO TABS: 10.0000 mg | ORAL_TABLET | Freq: Three times a day (TID) | ORAL | 0 refills | Status: DC

## 2019-07-23 NOTE — Progress Notes (Signed)
Patient: Mary Mosley Female    DOB: Nov 13, 1941   78 y.o.   MRN: [id number] Visit Date: 07/23/2019  Today's Provider: Mar Daring, PA-C   No chief complaint on file.  Subjective:    Virtual Visit via Telephone Note  I connected with Mary Mosley on 07/23/19 at  2:40 PM EDT by telephone and verified that I am speaking with the correct person using two identifiers.  Location: Patient: Home Provider: [facility]   I discussed the limitations, risks, security and privacy concerns of performing an evaluation and management service by telephone and the availability of in person appointments. I also discussed with the patient that there may be a patient responsible charge related to this service. The patient expressed understanding and agreed to proceed.  HPI  Patient with c/o about a week ago she was wearing a back brace and she was using a sitting elliptical machine and noticed that her back just started to hurt. Reports that since then her left side of back has been very sore since. Sunday and Monday she did work outside in her yard more and was more active. Then on Monday she woke up around 2:30 am with chills. She feels that she is not having any energy like her usual. 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 Was able to even go to the gym today to exercise and had temperature checked there and was normal. She denies any URI symptoms, fevers, nausea, vomiting, cough, SOB, chest pain, bowel changes or bladder changes. No radiating symptoms down her leg.   Allergies  Allergen Reactions  . Fosamax [Alendronate Sodium] Itching  . Penicillins      Current Outpatient Medications:  .  cholecalciferol (VITAMIN D) 1000 UNITS tablet, Take 2 tablets by mouth daily., Disp: , Rfl:  .  LORazepam (ATIVAN) 1 MG tablet, Take 1 tablet (1 mg total) by mouth at bedtime as needed., Disp: 30 tablet, Rfl: 3   Review of Systems  Constitutional: Negative.   HENT: Negative.   Eyes: Positive for visual disturbance.  Respiratory: Negative.   Cardiovascular: Negative.   Gastrointestinal: Negative.   Musculoskeletal: Positive for back pain. Negative for gait problem.  Neurological: Positive for light-headedness (chronic). Negative for dizziness, weakness, numbness and headaches.    Social History   Tobacco Use  . Smoking status: Never Smoker  . Smokeless tobacco: Never Used  Substance Use Topics  . Alcohol use: No    Alcohol/week: 0.0 standard drinks      Objective:   Wt 120 lb (54.4 kg)   BMI 19.37 kg/m  Vitals:   07/23/19 1326  Weight: 120 lb (54.4 kg)  Body mass index is 19.37 kg/m.   Physical Exam Vitals signs reviewed.  Constitutional:      General: She is not in acute distress.    Appearance: Normal appearance. She is not ill-appearing.  Pulmonary:     Effort: Pulmonary effort is normal. No respiratory distress.  Neurological:     Mental Status: She is alert.      No results found for any visits on 07/23/19.     Assessment & Plan     1. Back strain, initial encounter Suspect muscular strain due to mechanism and no other associated symptoms. Will treat with prednisone and baclofen as below. Advised she may use moist heat and light stretches. She needs to call if  not improving, if symptoms change or worsen.  - predniSONE (STERAPRED UNI-PAK 21 TAB) 10 MG (21) TBPK tablet; 6 day taper; take as directed on package instructions  Dispense: 21 tablet; Refill: 0 - baclofen (LIORESAL) 10 MG tablet; Take 1 tablet (10 mg total) by mouth 3 (three) times daily.  Dispense: 30 each; Refill: 0   I discussed the assessment and treatment plan with the patient. The patient was provided an opportunity to ask questions and all were answered. The patient agreed with the plan and demonstrated an understanding of the instructions.   The patient was advised to call back or seek an  in-person evaluation if the symptoms worsen or if the condition fails to improve as anticipated.  I provided 13 minutes of non-face-to-face time during this encounter.    Margaretann LovelessJennifer M , PA-C  [facility]

## 2019-07-25 NOTE — Progress Notes (Signed)
Subjective:   PAMULA KLEEN is a 78 y.o. female who presents for Medicare Annual (Subsequent) preventive examination.    This visit is being conducted through telemedicine due to the COVID-19 pandemic. This patient has given me verbal consent via doximity to conduct this visit, patient states they are participating from their home address. Some vital signs may be absent or patient reported.    Patient identification: identified by name, DOB, and current address  Review of Systems:  N/A  Cardiac Risk Factors include: advanced age (>40men, >39 women)     Objective:     Vitals: There were no vitals taken for this visit.  There is no height or weight on file to calculate BMI. Unable to obtain vitals due to visit being conducted via telephonically.   Advanced Directives 07/30/2019 07/24/2018 07/21/2017  Does Patient Have a Medical Advance Directive? Yes Yes Yes  Type of Estate agent of [place];Living will Healthcare Power of [place];Living will Healthcare Power of [place];Living will  Copy of Healthcare Power of Attorney in Chart? No - copy requested No - copy requested No - copy requested    Tobacco Social History   Tobacco Use  Smoking Status Never Smoker  Smokeless Tobacco Never Used     Counseling given: Not Answered   Clinical Intake:  Pre-visit preparation completed: Yes  Pain : 0-10 Pain Score: 5  Pain Location: Back Pain Orientation: Lower Pain Descriptors / Indicators: Aching Pain Frequency: Constant     Nutritional Risks: None Diabetes: No  How often do you need to have someone help you when you read instructions, pamphlets, or other written materials from your doctor or pharmacy?: 1 - Never  Interpreter Needed?: No  Information entered by :: [facility], LPN  Past Medical History:  Diagnosis Date  . OSA (obstructive sleep apnea)   . Osteopenia    Past Surgical History:  Procedure Laterality Date  . EYE SURGERY Bilateral    due to trauma  . TONSILLECTOMY AND ADENOIDECTOMY     Family History  Problem Relation Age of Onset  . Heart attack Mother   . Lung cancer Brother   . Heart Problems Brother   . Heart Problems Brother        Has stents  . Liver cancer Son   . Breast cancer Neg Hx    Social History   Socioeconomic History  . Marital status: Widowed    Spouse name: Not on file  . Number of children: 2  . Years of education: Not on file  . Highest education level: Some college, no degree  Occupational History  . Occupation: Retired  Engineer, production  . Financial resource strain: Not hard at all  . Food insecurity    Worry: Never true    Inability: Never true  . Transportation needs    Medical: No    Non-medical: No  Tobacco Use  . Smoking status: Never Smoker  . Smokeless tobacco: Never Used  Substance and Sexual Activity  . Alcohol use: No    Alcohol/week: 0.0 standard drinks  . Drug use: No  . Sexual activity: Not on file  Lifestyle  . Physical activity    Days per week: 0 days    Minutes per session: 0 min  . Stress: Not at all  Relationships  . Social connections    Talks on phone: Patient refused    Gets together: Patient refused    Attends religious service: Patient refused    Active member  of club or organization: Patient refused    Attends meetings of clubs or organizations: Patient refused    Relationship status: Patient refused  Other Topics Concern  . Not on file  Social History Narrative  . Not on file    Outpatient Encounter Medications as of 07/30/2019  Medication Sig  . baclofen (LIORESAL) 10 MG tablet Take 1 tablet (10 mg total) by mouth 3 (three) times daily.  . cholecalciferol (VITAMIN D) 1000 UNITS tablet Take 2 tablets by mouth daily.  Marland Kitchen. LORazepam (ATIVAN) 1 MG tablet Take 1 tablet (1 mg total) by mouth at bedtime as needed.  . predniSONE (STERAPRED UNI-PAK 21 TAB) 10 MG (21) TBPK tablet 6 day taper; take as directed on package instructions   No  facility-administered encounter medications on file as of 07/30/2019.     Activities of Daily Living In your present state of health, do you have any difficulty performing the following activities: 07/30/2019  Hearing? N  Vision? N  Difficulty concentrating or making decisions? N  Walking or climbing stairs? N  Dressing or bathing? N  Doing errands, shopping? N  Preparing Food and eating ? N  Using the Toilet? N  In the past six months, have you accidently leaked urine? N  Do you have problems with loss of bowel control? N  Managing your Medications? N  Managing your Finances? N  Housekeeping or managing your Housekeeping? N  Some recent data might be hidden    Patient Care Team: Malva LimesFisher, Donald E, MD as PCP - General (Family Medicine) [place] CraneKing, Bradley Mark, MD as Consulting Physician (Ophthalmology)    Assessment:   This is a routine wellness examination for Alona BeneJoyce.  Exercise Activities and Dietary recommendations Current Exercise Habits: Structured exercise class, Type of exercise: stretching;strength training/weights, Time (Minutes): 30, Frequency (Times/Week): 3, Weekly Exercise (Minutes/Week): 90, Intensity: Mild, Exercise limited by: orthopedic condition(s)  Goals    . Increase water intake     Recommend increasing water intake to 6-8 glasses of water a day.        Fall Risk: Fall Risk  07/30/2019 07/24/2018 07/21/2017 09/15/2016 09/07/2015  Falls in the past year? 0 No No No No    FALL RISK PREVENTION PERTAINING TO THE HOME:  Any stairs in or around the home? Yes  If so, are there any without handrails? No   Home free of loose throw rugs in walkways, pet beds, electrical cords, etc? Yes  Adequate lighting in your home to reduce risk of falls? Yes   ASSISTIVE DEVICES UTILIZED TO PREVENT FALLS:  Life alert? No  Use of a cane, walker or w/c? No  Grab bars in the bathroom? Yes  Shower chair or bench in shower? No  Elevated toilet seat or a handicapped toilet? No     TIMED UP AND GO:  Was the test performed? No .    Depression Screen PHQ 2/9 Scores 07/30/2019 07/24/2018 07/21/2017 09/15/2016  PHQ - 2 Score 0 0 0 0  PHQ- 9 Score - - - 0     Cognitive Function     6CIT Screen 07/30/2019 07/21/2017  What Year? 0 points 0 points  What month? 0 points 0 points  What time? 0 points 0 points  Count back from 20 0 points 0 points  Months in reverse 0 points 0 points  Repeat phrase 0 points 4 points  Total Score 0 4    Immunization History  Administered Date(s) Administered  . Influenza, High Dose Seasonal PF  09/07/2015, 09/15/2016, 07/21/2017, 07/24/2018  . Pneumococcal Conjugate-13 09/04/2014  . Pneumococcal Polysaccharide-23 01/08/2004    Qualifies for Shingles Vaccine? Yes . Due for Shingrix. Education has been provided regarding the importance of this vaccine. Pt has been advised to call insurance company to determine out of pocket expense. Advised may also receive vaccine at local pharmacy or Health Dept. Verbalized acceptance and understanding.  Tdap: Although this vaccine is not a covered service during a Wellness Exam, does the patient still wish to receive this vaccine today?  No .   Flu Vaccine: Due for Flu vaccine. Does the patient want to receive this vaccine today?  No .   Pneumococcal Vaccine: Completed series  Screening Tests Health Maintenance  Topic Date Due  . INFLUENZA VACCINE  06/22/2019  . TETANUS/TDAP  11/21/2026 (Originally 01/07/1960)  . DEXA SCAN  10/29/2021  . PNA vac Low Risk Adult  Completed    Cancer Screenings:  Colorectal Screening: No longer required.   Mammogram: No longer required.   Bone Density: Completed 10/29/18. Results reflect OSTEOPENIA. Repeat every 3 years.   Lung Cancer Screening: (Low Dose CT Chest recommended if Age 46-80 years, 30 pack-year currently smoking OR have quit w/in 15years.) does not qualify.   Additional Screening:  Vision Screening: Recommended annual ophthalmology exams for  early detection of glaucoma and other disorders of the eye.  Dental Screening: Recommended annual dental exams for proper oral hygiene  Community Resource Referral:  CRR required this visit?  No       Plan:  I have personally reviewed and addressed the Medicare Annual Wellness questionnaire and have noted the following in the patient's chart:  A. Medical and social history B. Use of alcohol, tobacco or illicit drugs  C. Current medications and supplements D. Functional ability and status E.  Nutritional status F.  Physical activity G. Advance directives H. List of other physicians I.  Hospitalizations, surgeries, and ER visits in previous 12 months J.  [place] such as hearing and vision if needed, cognitive and depression L. Referrals and appointments   In addition, I have reviewed and discussed with patient certain preventive protocols, quality metrics, and best practice recommendations. A written personalized care plan for preventive services as well as general preventive health recommendations were provided to patient. Nurse Health Advisor  Signed,    Cerria Randhawa [place], [place]  1/0/1751 Nurse Health Advisor   Nurse Notes: Due for an influenza vaccine at next in office visit.

## 2019-07-30 ENCOUNTER — Other Ambulatory Visit: Payer: Self-pay

## 2019-07-30 ENCOUNTER — Ambulatory Visit (INDEPENDENT_AMBULATORY_CARE_PROVIDER_SITE_OTHER): Payer: Medicare Other

## 2019-07-30 DIAGNOSIS — Z0000 Encounter for general adult medical examination without abnormal findings: Secondary | ICD-10-CM

## 2019-07-30 NOTE — Progress Notes (Signed)
Patient: Mary Mosley Female    DOB: [birth year]   78 y.o.   MRN: [id number] Visit Date: 07/31/2019  Today's Provider: Margaretann LovelessJennifer M Nikolus Marczak, PA-C   Chief Complaint  Patient presents with  . Back Pain   Subjective:     Back Pain This is a new problem. The current episode started 1 to 4 weeks ago (Started three weeks ago). The problem has been waxing and waning since onset. The pain is present in the lumbar spine (On the left side). The pain does not radiate. Pertinent negatives include no abdominal pain, chest pain, dysuria, fever, headaches, numbness or weakness. Treatments tried: Rest.  Dizziness This is a new problem. Episode onset: Started about three weeks ago. The problem occurs daily. The problem has been unchanged. Associated symptoms include chills and diaphoresis. Pertinent negatives include no abdominal pain, arthralgias, chest pain, fatigue, fever, headaches, joint swelling, myalgias, nausea, neck pain, numbness, vomiting or weakness.     Allergies  Allergen Reactions  . Fosamax [Alendronate Sodium] Itching  . Penicillins      Current Outpatient Medications:  .  baclofen (LIORESAL) 10 MG tablet, Take 1 tablet (10 mg total) by mouth 3 (three) times daily., Disp: 30 each, Rfl: 0 .  cholecalciferol (VITAMIN D) 1000 UNITS tablet, Take 2 tablets by mouth daily., Disp: , Rfl:  .  LORazepam (ATIVAN) 1 MG tablet, Take 1 tablet (1 mg total) by mouth at bedtime as needed., Disp: 30 tablet, Rfl: 3 .  predniSONE (STERAPRED UNI-PAK 21 TAB) 10 MG (21) TBPK tablet, 6 day taper; take as directed on package instructions, Disp: 21 tablet, Rfl: 0  Review of Systems  Constitutional: Positive for chills and diaphoresis. Negative for activity change, appetite change, fatigue, fever and unexpected weight change.  Respiratory: Negative.  Negative for chest tightness and shortness of breath.   Cardiovascular: Negative.  Negative for chest pain and palpitations.  Gastrointestinal:  Negative.  Negative for abdominal pain, nausea and vomiting.  Genitourinary: Positive for frequency. Negative for decreased urine volume, difficulty urinating, dysuria, hematuria, urgency, vaginal bleeding, vaginal discharge and vaginal pain.  Musculoskeletal: Positive for back pain. Negative for arthralgias, gait problem, joint swelling, myalgias, neck pain and neck stiffness.  Neurological: Positive for dizziness. Negative for weakness, light-headedness, numbness and headaches.    Social History   Tobacco Use  . Smoking status: Never Smoker  . Smokeless tobacco: Never Used  Substance Use Topics  . Alcohol use: No    Alcohol/week: 0.0 standard drinks      Objective:   Temp 97.7 F (36.5 C) (Temporal)   Wt 121 lb (54.9 kg)   BMI 19.53 kg/m  Vitals:   07/31/19 1054  Temp: 97.7 F (36.5 C)  TempSrc: Temporal  Weight: 121 lb (54.9 kg)  Body mass index is 19.53 kg/m.   Physical Exam Constitutional:      General: She is not in acute distress.    Appearance: Normal appearance. She is well-developed and normal weight. She is not ill-appearing or diaphoretic.  Cardiovascular:     Rate and Rhythm: Normal rate and regular rhythm.     Heart sounds: Normal heart sounds. No murmur. No friction rub. No gallop.   Pulmonary:     Effort: Pulmonary effort is normal. No respiratory distress.     Breath sounds: Normal breath sounds. No wheezing or rales.  Abdominal:     General: Abdomen is flat. Bowel sounds are normal. There is no distension.  Palpations: Abdomen is soft. There is no mass.     Tenderness: There is no abdominal tenderness. There is left CVA tenderness. There is no right CVA tenderness, guarding or rebound.  Skin:    General: Skin is warm and dry.  Neurological:     Mental Status: She is alert and oriented to person, place, and time.      No results found for any visits on 07/31/19.     Assessment & Plan    1. Urinary frequency UA positive.  - POCT  urinalysis dipstick  2. Cystitis Worsening symptoms. UA positive. Will treat empirically with Bactrim as below to cover upper tract as well just in case. Continue to push fluids. Urine sent for culture. Will follow up pending C&S results. She is to call if symptoms do not improve or if they worsen.  - CULTURE, URINE COMPREHENSIVE - sulfamethoxazole-trimethoprim (BACTRIM DS) 800-160 MG tablet; Take 1 tablet by mouth 2 (two) times daily.  Dispense: 20 tablet; Refill: 0     Mar Daring, PA-C  [facility]

## 2019-07-30 NOTE — Patient Instructions (Signed)
[name] , Thank you for taking time to come for your Medicare Wellness Visit. I appreciate your ongoing commitment to your health goals. Please review the following plan we discussed and let me know if I can assist you in the future.   Screening recommendations/referrals: Colonoscopy: No longer required.  Mammogram: No longer required.  Bone Density: Up to date, due 10/2021 Recommended yearly ophthalmology/optometry visit for glaucoma screening and checkup Recommended yearly dental visit for hygiene and checkup  Vaccinations: Influenza vaccine: Currently due Pneumococcal vaccine: Completed series Tdap vaccine: Pt declines today.  Shingles vaccine: Pt declines today.     Advanced directives: Please bring a copy of your POA (Power of Attorney) and/or Living Will to your next appointment.   Conditions/risks identified: None.   Next appointment: 07/31/19 @ 11:00 AM with Fenton Malling for a follow up on back pain and vertigo.    Preventive Care 80 Years and Older, Female Preventive care refers to lifestyle choices and visits with your health care provider that can promote health and wellness. What does preventive care include?  A yearly physical exam. This is also called an annual well check.  Dental exams once or twice a year.  Routine eye exams. Ask your health care provider how often you should have your eyes checked.  Personal lifestyle choices, including:  Daily care of your teeth and gums.  Regular physical activity.  Eating a healthy diet.  Avoiding tobacco and drug use.  Limiting alcohol use.  Practicing safe sex.  Taking low-dose aspirin every day.  Taking vitamin and mineral supplements as recommended by your health care provider. What happens during an annual well check? The services and screenings done by your health care provider during your annual well check will depend on your age, overall health, lifestyle risk factors, and family history of disease.  Counseling  Your health care provider may ask you questions about your:  Alcohol use.  Tobacco use.  Drug use.  Emotional well-being.  Home and relationship well-being.  Sexual activity.  Eating habits.  History of falls.  Memory and ability to understand (cognition).  Work and work Statistician.  Reproductive health. Screening  You may have the following tests or measurements:  Height, weight, and BMI.  Blood pressure.  Lipid and cholesterol levels. These may be checked every 5 years, or more frequently if you are over 74 years old.  Skin check.  Lung cancer screening. You may have this screening every year starting at age 47 if you have a 30-pack-year history of smoking and currently smoke or have quit within the past 15 years.  Fecal occult blood test (FOBT) of the stool. You may have this test every year starting at age 89.  Flexible sigmoidoscopy or colonoscopy. You may have a sigmoidoscopy every 5 years or a colonoscopy every 10 years starting at age 41.  Hepatitis C blood test.  Hepatitis B blood test.  Sexually transmitted disease (STD) testing.  Diabetes screening. This is done by checking your blood sugar (glucose) after you have not eaten for a while (fasting). You may have this done every 1-3 years.  Bone density scan. This is done to screen for osteoporosis. You may have this done starting at age 57.  Mammogram. This may be done every 1-2 years. Talk to your health care provider about how often you should have regular mammograms. Talk with your health care provider about your test results, treatment options, and if necessary, the need for more tests. Vaccines  Your  health care provider may recommend certain vaccines, such as:  Influenza vaccine. This is recommended every year.  Tetanus, diphtheria, and acellular pertussis (Tdap, Td) vaccine. You may need a Td booster every 10 years.  Zoster vaccine. You may need this after age 45.   Pneumococcal 13-valent conjugate (PCV13) vaccine. One dose is recommended after age 42.  Pneumococcal polysaccharide (PPSV23) vaccine. One dose is recommended after age 89. Talk to your health care provider about which screenings and vaccines you need and how often you need them. This information is not intended to replace advice given to you by your health care provider. Make sure you discuss any questions you have with your health care provider. Document Released: 12/04/2015 Document Revised: 07/27/2016 Document Reviewed: 09/08/2015 Elsevier Interactive Patient Education  2017 [place] Prevention in the Home Falls can cause injuries. They can happen to people of all ages. There are many things you can do to make your home safe and to help prevent falls. What can I do on the outside of my home?  Regularly fix the edges of walkways and driveways and fix any cracks.  Remove anything that might make you trip as you walk through a door, such as a raised step or threshold.  Trim any bushes or trees on the path to your home.  Use bright outdoor lighting.  Clear any walking paths of anything that might make someone trip, such as rocks or tools.  Regularly check to see if handrails are loose or broken. Make sure that both sides of any steps have handrails.  Any raised decks and porches should have guardrails on the edges.  Have any leaves, snow, or ice cleared regularly.  Use sand or salt on walking paths during winter.  Clean up any spills in your garage right away. This includes oil or grease spills. What can I do in the bathroom?  Use night lights.  Install grab bars by the toilet and in the tub and shower. Do not use towel bars as grab bars.  Use non-skid mats or decals in the tub or shower.  If you need to sit down in the shower, use a plastic, non-slip stool.  Keep the floor dry. Clean up any water that spills on the floor as soon as it happens.  Remove soap  buildup in the tub or shower regularly.  Attach bath mats securely with double-sided non-slip rug tape.  Do not have throw rugs and other things on the floor that can make you trip. What can I do in the bedroom?  Use night lights.  Make sure that you have a light by your bed that is easy to reach.  Do not use any sheets or blankets that are too big for your bed. They should not hang down onto the floor.  Have a firm chair that has side arms. You can use this for support while you get dressed.  Do not have throw rugs and other things on the floor that can make you trip. What can I do in the kitchen?  Clean up any spills right away.  Avoid walking on wet floors.  Keep items that you use a lot in easy-to-reach places.  If you need to reach something above you, use a strong step stool that has a grab bar.  Keep electrical cords out of the way.  Do not use floor polish or wax that makes floors slippery. If you must use wax, use non-skid floor wax.  Do not  have throw rugs and other things on the floor that can make you trip. What can I do with my stairs?  Do not leave any items on the stairs.  Make sure that there are handrails on both sides of the stairs and use them. Fix handrails that are broken or loose. Make sure that handrails are as long as the stairways.  Check any carpeting to make sure that it is firmly attached to the stairs. Fix any carpet that is loose or worn.  Avoid having throw rugs at the top or bottom of the stairs. If you do have throw rugs, attach them to the floor with carpet tape.  Make sure that you have a light switch at the top of the stairs and the bottom of the stairs. If you do not have them, ask someone to add them for you. What else can I do to help prevent falls?  Wear shoes that:  Do not have high heels.  Have rubber bottoms.  Are comfortable and fit you well.  Are closed at the toe. Do not wear sandals.  If you use a stepladder:  Make  sure that it is fully opened. Do not climb a closed stepladder.  Make sure that both sides of the stepladder are locked into place.  Ask someone to hold it for you, if possible.  Clearly mark and make sure that you can see:  Any grab bars or handrails.  First and last steps.  Where the edge of each step is.  Use tools that help you move around (mobility aids) if they are needed. These include:  Canes.  Walkers.  Scooters.  Crutches.  Turn on the lights when you go into a dark area. Replace any light bulbs as soon as they burn out.  Set up your furniture so you have a clear path. Avoid moving your furniture around.  If any of your floors are uneven, fix them.  If there are any pets around you, be aware of where they are.  Review your medicines with your doctor. Some medicines can make you feel dizzy. This can increase your chance of falling. Ask your doctor what other things that you can do to help prevent falls. This information is not intended to replace advice given to you by your health care provider. Make sure you discuss any questions you have with your health care provider. Document Released: 09/03/2009 Document Revised: 04/14/2016 Document Reviewed: 12/12/2014 Elsevier Interactive Patient Education  2017 [employer].

## 2019-07-31 ENCOUNTER — Encounter: Payer: Self-pay | Admitting: Physician Assistant

## 2019-07-31 ENCOUNTER — Other Ambulatory Visit: Payer: Self-pay

## 2019-07-31 ENCOUNTER — Ambulatory Visit (INDEPENDENT_AMBULATORY_CARE_PROVIDER_SITE_OTHER): Payer: Medicare Other | Admitting: Physician Assistant

## 2019-07-31 VITALS — BP 156/84 | HR 112 | Temp 97.7°F | Wt 121.0 lb

## 2019-07-31 DIAGNOSIS — N3090 Cystitis, unspecified without hematuria: Secondary | ICD-10-CM | POA: Diagnosis not present

## 2019-07-31 DIAGNOSIS — R350 Frequency of micturition: Secondary | ICD-10-CM

## 2019-07-31 LAB — POCT URINALYSIS DIPSTICK
Appearance: ABNORMAL
Bilirubin, UA: NEGATIVE
Glucose, UA: NEGATIVE
Ketones, UA: POSITIVE
Nitrite, UA: POSITIVE
Protein, UA: POSITIVE — AB
Spec Grav, UA: 1.025 (ref 1.010–1.025)
Urobilinogen, UA: 0.2 E.U./dL
pH, UA: 6 (ref 5.0–8.0)

## 2019-07-31 MED ORDER — SULFAMETHOXAZOLE-TRIMETHOPRIM 800-160 MG PO TABS: 1.0000 | ORAL_TABLET | Freq: Two times a day (BID) | ORAL | 0 refills | Status: DC

## 2019-07-31 NOTE — Patient Instructions (Signed)
Urinary Tract Infection, Adult A urinary tract infection (UTI) is an infection of any part of the urinary tract. The urinary tract includes:  The kidneys.  The ureters.  The bladder.  The urethra. These organs make, store, and get rid of pee (urine) in the body. What are the causes? This is caused by germs (bacteria) in your genital area. These germs grow and cause swelling (inflammation) of your urinary tract. What increases the risk? You are more likely to develop this condition if:  You have a small, thin tube (catheter) to drain pee.  You cannot control when you pee or poop (incontinence).  You are female, and: ? You use these methods to prevent pregnancy: ? A medicine that kills sperm (spermicide). ? A device that blocks sperm (diaphragm). ? You have low levels of a female hormone (estrogen). ? You are pregnant.  You have genes that add to your risk.  You are sexually active.  You take antibiotic medicines.  You have trouble peeing because of: ? A prostate that is bigger than normal, if you are female. ? A blockage in the part of your body that drains pee from the bladder (urethra). ? A kidney stone. ? A nerve condition that affects your bladder (neurogenic bladder). ? Not getting enough to drink. ? Not peeing often enough.  You have other conditions, such as: ? Diabetes. ? A weak disease-fighting system (immune system). ? Sickle cell disease. ? Gout. ? Injury of the spine. What are the signs or symptoms? Symptoms of this condition include:  Needing to pee right away (urgently).  Peeing often.  Peeing small amounts often.  Pain or burning when peeing.  Blood in the pee.  Pee that smells bad or not like normal.  Trouble peeing.  Pee that is cloudy.  Fluid coming from the vagina, if you are female.  Pain in the belly or lower back. Other symptoms include:  Throwing up (vomiting).  No urge to eat.  Feeling mixed up (confused).  Being tired  and grouchy (irritable).  A fever.  Watery poop (diarrhea). How is this treated? This condition may be treated with:  Antibiotic medicine.  Other medicines.  Drinking enough water. Follow these instructions at home:  Medicines  Take over-the-counter and prescription medicines only as told by your doctor.  If you were prescribed an antibiotic medicine, take it as told by your doctor. Do not stop taking it even if you start to feel better. General instructions  Make sure you: ? Pee until your bladder is empty. ? Do not hold pee for a long time. ? Empty your bladder after sex. ? Wipe from front to back after pooping if you are a female. Use each tissue one time when you wipe.  Drink enough fluid to keep your pee pale yellow.  Keep all follow-up visits as told by your doctor. This is important. Contact a doctor if:  You do not get better after 1-2 days.  Your symptoms go away and then come back. Get help right away if:  You have very bad back pain.  You have very bad pain in your lower belly.  You have a fever.  You are sick to your stomach (nauseous).  You are throwing up. Summary  A urinary tract infection (UTI) is an infection of any part of the urinary tract.  This condition is caused by germs in your genital area.  There are many risk factors for a UTI. These include having a small, thin   tube to drain pee and not being able to control when you pee or poop.  Treatment includes antibiotic medicines for germs.  Drink enough fluid to keep your pee pale yellow. This information is not intended to replace advice given to you by your health care provider. Make sure you discuss any questions you have with your health care provider. Document Released: 04/25/2008 Document Revised: 10/25/2018 Document Reviewed: 05/17/2018 Elsevier Patient Education  2020 Elsevier Inc.  

## 2019-08-02 LAB — CULTURE, URINE COMPREHENSIVE

## 2019-08-05 ENCOUNTER — Telehealth: Payer: Self-pay

## 2019-08-05 NOTE — Telephone Encounter (Signed)
LMTCB

## 2019-08-05 NOTE — Telephone Encounter (Signed)
-----   Message from Mar Daring, [place] sent at 08/02/2019  5:04 PM EDT ----- Urine culture was positive for e.coli. It is sensitive to antibiotic. Continue until completed.

## 2019-08-05 NOTE — Telephone Encounter (Signed)
Patient advised as directed below. 

## 2019-08-27 ENCOUNTER — Other Ambulatory Visit: Payer: Self-pay | Admitting: Family Medicine

## 2019-08-27 DIAGNOSIS — G4700 Insomnia, unspecified: Secondary | ICD-10-CM

## 2019-08-27 MED ORDER — LORAZEPAM 1 MG PO TABS: 1.0000 mg | ORAL_TABLET | Freq: Every evening | ORAL | 3 refills | Status: DC | PRN

## 2019-08-27 NOTE — Telephone Encounter (Signed)
Pt needs a refill  Lorazepam 1 mg  [facility] [address]  [employer]

## 2019-09-26 DIAGNOSIS — H2513 Age-related nuclear cataract, bilateral: Secondary | ICD-10-CM | POA: Diagnosis not present

## 2019-10-15 ENCOUNTER — Other Ambulatory Visit: Payer: Self-pay

## 2019-10-15 ENCOUNTER — Ambulatory Visit (INDEPENDENT_AMBULATORY_CARE_PROVIDER_SITE_OTHER): Payer: Medicare Other | Admitting: Family Medicine

## 2019-10-15 ENCOUNTER — Encounter: Payer: Self-pay | Admitting: Family Medicine

## 2019-10-15 VITALS — BP 122/66 | HR 84 | Temp 96.8°F | Resp 16 | Ht 65.24 in | Wt 125.0 lb

## 2019-10-15 DIAGNOSIS — Z136 Encounter for screening for cardiovascular disorders: Secondary | ICD-10-CM | POA: Diagnosis not present

## 2019-10-15 DIAGNOSIS — Z0000 Encounter for general adult medical examination without abnormal findings: Secondary | ICD-10-CM | POA: Diagnosis not present

## 2019-10-15 NOTE — Progress Notes (Signed)
Patient: Mary Mosley, Female    DOB: 08/29/1941, 78 y.o.   MRN: [id number] Visit Date: 10/15/2019  Today's Provider: Lelon Huh, MD   Chief Complaint  Patient presents with  . Annual Exam  . Insomnia   Subjective:     Complete Physical Mary Mosley is a 78 y.o. female. She feels fairly well. She reports exercising daily. She reports she is sleeping poorly.  -----------------------------------------------------------   Follow up for Insomnia:  The patient was last seen for this 1 years ago. Changes made at last visit include no changes.  She reports good compliance with treatment. She feels that condition is Worse. She is not having side effects.  Patient feels that the Lorazepam is not as effective as it used to be.   ------------------------------------------------------------------------------------  Follow up for Osteopenia:   The patient was last seen for this 1 years ago. Changes made at last visit include ordering BMD which showed borderline osteoporosis. Patient was started on Fosamax. Patient stopped taking Fosamax due to it causing itchiness.   She reports good compliance with treatment. She feels that condition is Unchanged. She is not having side effects.   ------------------------------------------------------------------------------------  Review of Systems  Constitutional: Negative for chills, fatigue and fever.  HENT: Negative for congestion, ear pain, rhinorrhea, sneezing and sore throat.   Eyes: Negative.  Negative for pain and redness.  Respiratory: Negative for cough, shortness of breath and wheezing.   Cardiovascular: Negative for chest pain and leg swelling.  Gastrointestinal: Negative for abdominal pain, blood in stool, constipation, diarrhea and nausea.  Endocrine: Negative for polydipsia and polyphagia.  Genitourinary: Negative.  Negative for dysuria, flank pain, hematuria, pelvic pain, vaginal bleeding and vaginal discharge.   Musculoskeletal: Positive for back pain. Negative for arthralgias, gait problem and joint swelling.  Skin: Negative for rash.  Allergic/Immunologic: Positive for environmental allergies.  Neurological: Positive for headaches. Negative for dizziness, tremors, seizures, weakness, light-headedness and numbness.  Hematological: Negative for adenopathy.  Psychiatric/Behavioral: Positive for sleep disturbance. Negative for behavioral problems, confusion and dysphoric mood. The patient is not nervous/anxious and is not hyperactive.     Social History   Socioeconomic History  . Marital status: Widowed    Spouse name: Not on file  . Number of children: 2  . Years of education: Not on file  . Highest education level: Some college, no degree  Occupational History  . Occupation: Retired  Scientific laboratory technician  . Financial resource strain: Not hard at all  . Food insecurity    Worry: Never true    Inability: Never true  . Transportation needs    Medical: No    Non-medical: No  Tobacco Use  . Smoking status: Never Smoker  . Smokeless tobacco: Never Used  Substance and Sexual Activity  . Alcohol use: No    Alcohol/week: 0.0 standard drinks  . Drug use: No  . Sexual activity: Not on file  Lifestyle  . Physical activity    Days per week: 0 days    Minutes per session: 0 min  . Stress: Not at all  Relationships  . Social Herbalist on phone: Patient refused    Gets together: Patient refused    Attends religious service: Patient refused    Active member of club or organization: Patient refused    Attends meetings of clubs or organizations: Patient refused    Relationship status: Patient refused  . Intimate partner violence    Fear  of current or ex partner: Patient refused    Emotionally abused: Patient refused    Physically abused: Patient refused    Forced sexual activity: Patient refused  Other Topics Concern  . Not on file  Social History Narrative  . Not on file    Past  Medical History:  Diagnosis Date  . OSA (obstructive sleep apnea)   . Osteopenia      Patient Active Problem List   Diagnosis Date Noted  . Breast calcification, right 09/03/2015  . Myalgia 09/03/2015  . Osteopenia 08/01/2011  . Edema 03/12/2010  . Insomnia 01/01/2009  . OSA (obstructive sleep apnea) 07/24/2008    Past Surgical History:  Procedure Laterality Date  . EYE SURGERY Bilateral    due to trauma  . TONSILLECTOMY AND ADENOIDECTOMY      Her family history includes Heart Problems in her brother and brother; Heart attack in her mother; Liver cancer in her son; Lung cancer in her brother. There is no history of Breast cancer.   Current Outpatient Medications:  .  cholecalciferol (VITAMIN D) 1000 UNITS tablet, Take 2 tablets by mouth daily., Disp: , Rfl:  .  LORazepam (ATIVAN) 1 MG tablet, Take 1 tablet (1 mg total) by mouth at bedtime as needed., Disp: 30 tablet, Rfl: 3  Patient Care Team: Malva Limes, MD as PCP - General (Family Medicine) Nevada Crane, MD as Consulting Physician (Ophthalmology)     Objective:    Vitals: BP 122/66 (BP Location: Left Arm, Patient Position: Sitting, Cuff Size: Normal)   Pulse 84   Temp (!) 96.8 F (36 C) (Temporal)   Resp 16   Ht 5' 5.24" (1.657 m)   Wt 125 lb (56.7 kg)   SpO2 99% Comment: room air  BMI 20.65 kg/m   Physical Exam   General Appearance:    Well developed, well nourished female. Alert, cooperative, in no acute distress, appears stated age   Head:    Normocephalic, without obvious abnormality, atraumatic  Eyes:    PERRL, conjunctiva/corneas clear, EOM's intact, fundi    benign, both eyes  Ears:    Normal TM's and external ear canals, both ears  Nose:   Nares normal, septum midline, mucosa normal, no drainage    or sinus tenderness  Throat:   Lips, mucosa, and tongue normal; teeth and gums normal  Neck:   Supple, symmetrical, trachea midline, no adenopathy;    thyroid:  no  enlargement/tenderness/nodules; no carotid   bruit or JVD  Back:     Symmetric, no curvature, ROM normal, no CVA tenderness  Lungs:     Clear to auscultation bilaterally, respirations unlabored  Chest Wall:    No tenderness or deformity   Heart:    Normal heart rate. Normal rhythm. No murmurs, rubs, or gallops.   Breast Exam:    normal appearance, no masses or tenderness  Abdomen:     Soft, non-tender, bowel sounds active all four quadrants,    no masses, no organomegaly  Pelvic:    deferred  Extremities:   All extremities are intact. No cyanosis or edema  Pulses:   2+ and symmetric all extremities  Skin:   Skin color, texture, turgor normal, no rashes or lesions  Lymph nodes:   Cervical, supraclavicular, and axillary nodes normal  Neurologic:   CNII-XII intact, normal strength, sensation and reflexes    throughout  \ Activities of Daily Living In your present state of health, do you have any difficulty performing the  following activities: 07/30/2019  Hearing? N  Vision? N  Difficulty concentrating or making decisions? N  Walking or climbing stairs? N  Dressing or bathing? N  Doing errands, shopping? N  Preparing Food and eating ? N  Using the Toilet? N  In the past six months, have you accidently leaked urine? N  Do you have problems with loss of bowel control? N  Managing your Medications? N  Managing your Finances? N  Housekeeping or managing your Housekeeping? N  Some recent data might be hidden    Fall Risk Assessment Fall Risk  07/30/2019 07/24/2018 07/21/2017 09/15/2016 09/07/2015  Falls in the past year? 0 No No No No     Depression Screen PHQ 2/9 Scores 07/30/2019 07/24/2018 07/21/2017 09/15/2016  PHQ - 2 Score 0 0 0 0  PHQ- 9 Score - - - 0    6CIT Screen 07/30/2019  What Year? 0 points  What month? 0 points  What time? 0 points  Count back from 20 0 points  Months in reverse 0 points  Repeat phrase 0 points  Total Score 0       Assessment & Plan:    Annual  Physical Reviewed patient's Family Medical History Reviewed and updated list of patient's medical providers Assessment of cognitive impairment was done Assessed patient's functional ability Established a written schedule for health screening services Health Risk Assessent Completed and Reviewed  Exercise Activities and Dietary recommendations Goals    . Increase water intake     Recommend increasing water intake to 6-8 glasses of water a day.        Immunization History  Administered Date(s) Administered  . Influenza, High Dose Seasonal PF 09/07/2015, 09/15/2016, 07/21/2017, 07/24/2018, 09/16/2019  . Pneumococcal Conjugate-13 09/04/2014  . Pneumococcal Polysaccharide-23 01/08/2004    Health Maintenance  Topic Date Due  . INFLUENZA VACCINE  06/22/2019  . TETANUS/TDAP  11/21/2026 (Originally 01/07/1960)  . DEXA SCAN  10/29/2021  . PNA vac Low Risk Adult  Completed     Discussed health benefits of physical activity, and encouraged her to engage in regular exercise appropriate for her age and condition.    ------------------------------------------------------------------------------------------------------------  The entirety of the information documented in the History of Present Illness, Review of Systems and Physical Exam were personally obtained by me. Portions of this information were initially documented by Awilda Billoshena Chambers, CMA and reviewed by me for thoroughness and accuracy.    Mila Merryonald Agustus Mane, MD  [facility]

## 2019-10-16 DIAGNOSIS — Z0000 Encounter for general adult medical examination without abnormal findings: Secondary | ICD-10-CM | POA: Diagnosis not present

## 2019-10-16 DIAGNOSIS — Z136 Encounter for screening for cardiovascular disorders: Secondary | ICD-10-CM | POA: Diagnosis not present

## 2019-10-17 LAB — BASIC METABOLIC PANEL
BUN/Creatinine Ratio: 12 (ref 12–28)
BUN: 11 mg/dL (ref 8–27)
CO2: 22 mmol/L (ref 20–29)
Calcium: 10.2 mg/dL (ref 8.7–10.3)
Chloride: 102 mmol/L (ref 96–106)
Creatinine, Ser: 0.95 mg/dL (ref 0.57–1.00)
GFR calc Af Amer: 65 mL/min/{1.73_m2} (ref >59)
GFR calc non Af Amer: 57 mL/min/{1.73_m2} — ABNORMAL LOW (ref >59)
Glucose: 95 mg/dL (ref 65–99)
Potassium: 4.3 mmol/L (ref 3.5–5.2)
Sodium: 142 mmol/L (ref 134–144)

## 2019-10-17 LAB — LIPID PANEL
Chol/HDL Ratio: 2.8 ratio (ref 0.0–4.4)
Cholesterol, Total: 216 mg/dL — ABNORMAL HIGH (ref 100–199)
HDL: 78 mg/dL (ref >39)
LDL Chol Calc (NIH): 124 mg/dL — ABNORMAL HIGH (ref 0–99)
Triglycerides: 80 mg/dL (ref 0–149)
VLDL Cholesterol Cal: 14 mg/dL (ref 5–40)

## 2019-10-18 ENCOUNTER — Telehealth: Payer: Self-pay

## 2019-10-18 NOTE — Telephone Encounter (Signed)
-----   Message from Birdie Sons, MD sent at 10/17/2019  9:57 AM EST ----- Labs normal. Check yearly

## 2019-10-18 NOTE — Telephone Encounter (Signed)
Pt called and I gave her normal results.  Pt understand results.

## 2019-10-18 NOTE — Telephone Encounter (Signed)
LMTCB OK for PEC to give results. 

## 2019-12-30 ENCOUNTER — Other Ambulatory Visit: Payer: Self-pay | Admitting: Family Medicine

## 2019-12-30 DIAGNOSIS — G4700 Insomnia, unspecified: Secondary | ICD-10-CM

## 2019-12-30 MED ORDER — LORAZEPAM 1 MG PO TABS: 1.0000 mg | ORAL_TABLET | Freq: Every evening | ORAL | 3 refills | Status: DC | PRN

## 2019-12-30 NOTE — Telephone Encounter (Signed)
Last RF 08/27/2019 LOV 10/15/2019

## 2019-12-30 NOTE — Telephone Encounter (Signed)
Patient dropped off Health Care Power of Attorney papers and ask for a refill on her Lorazepam 1 mg.  to be sent to [facility] on [address].

## 2020-05-06 ENCOUNTER — Other Ambulatory Visit: Payer: Self-pay | Admitting: Family Medicine

## 2020-05-06 DIAGNOSIS — Z1231 Encounter for screening mammogram for malignant neoplasm of breast: Secondary | ICD-10-CM

## 2020-06-10 ENCOUNTER — Ambulatory Visit
Admission: RE | Admit: 2020-06-10 | Discharge: 2020-06-10 | Disposition: A | Discharge status: Home / Self Care | Payer: Medicare Other | Source: Ambulatory Visit | Attending: Family Medicine | Admitting: Family Medicine

## 2020-06-10 DIAGNOSIS — Z1231 Encounter for screening mammogram for malignant neoplasm of breast: Secondary | ICD-10-CM | POA: Insufficient documentation

## 2020-06-16 ENCOUNTER — Other Ambulatory Visit: Payer: Self-pay | Admitting: Family Medicine

## 2020-06-16 DIAGNOSIS — G4700 Insomnia, unspecified: Secondary | ICD-10-CM

## 2020-06-16 NOTE — Telephone Encounter (Signed)
Requested medication (s) are due for refill today: yes  Requested medication (s) are on the active medication list: yes  Last refill:  12/30/19 #30 3 refills   Future visit scheduled: no  Notes to clinic:   not delegated per protocol no valid encounter in 6 months     Requested Prescriptions  Pending Prescriptions Disp Refills   LORazepam (ATIVAN) 1 MG tablet 30 tablet 3    Sig: Take 1 tablet (1 mg total) by mouth at bedtime as needed.      Not Delegated - Psychiatry:  Anxiolytics/Hypnotics Failed - 06/16/2020  3:42 PM      Failed - This refill cannot be delegated      Failed - Urine Drug Screen completed in last 360 days.      Failed - Valid encounter within last 6 months    Recent Outpatient Visits           8 months ago Encounter for special screening examination for cardiovascular disorder   [facility] Malva Limes, MD   10 months ago Urinary frequency   [facility] Joycelyn Man M, [place]   10 months ago Back strain, initial encounter   [facility] [place], Alessandra Bevels, [place]   1 year ago Annual physical exam   [facility] Malva Limes, MD   2 years ago Annual physical exam   [facility] Malva Limes, MD

## 2020-06-16 NOTE — Telephone Encounter (Signed)
Copied from CRM [id number]. Topic: Quick Communication - Rx Refill/Question >> Jun 16, 2020  3:35 PM Jaquita Rector A wrote: Medication: LORazepam (ATIVAN) 1 MG tablet   Has the patient contacted their pharmacy? Yes.   (Agent: If no, request that the patient contact the pharmacy for the refill.) (Agent: If yes, when and what did the pharmacy advise?)  Preferred Pharmacy (with phone number or street name): [facility] [address] [place], [place] - [address]  Phone:  [phone] Fax:  [phone]     Agent: Please be advised that RX refills may take up to 3 business days. We ask that you follow-up with your pharmacy.

## 2020-06-17 MED ORDER — LORAZEPAM 1 MG PO TABS: 1.0000 mg | ORAL_TABLET | Freq: Every evening | ORAL | 3 refills | Status: DC | PRN

## 2020-08-07 ENCOUNTER — Telehealth: Payer: Self-pay | Admitting: Family Medicine

## 2020-08-07 NOTE — Telephone Encounter (Signed)
Copied from CRM [id number]. Topic: Medicare AWV >> Aug 07, 2020  1:32 PM Claudette Laws R wrote: Reason for CRM:  Left message for patient to call back and schedule Medicare Annual Wellness Visit (AWV) either virtually or in office.  Last AWV  : Medicare AWVS: 07/30/2019  Please schedule at anytime with [facility] Health Advisor.  If any questions, please contact me at [phone]

## 2020-08-31 ENCOUNTER — Telehealth: Payer: Self-pay

## 2020-08-31 NOTE — Telephone Encounter (Signed)
Pt's son Sidrah Harden stopped by the office today with some concerns about the patient:   He reports that he and his wife live with her, and she seems to be declining cognitively. They have noticed personality changes, obsessive behavior, and a decrease in her vision. She is also not able to preform ADL's as much as she used to (ex: cooking, cleaning, hygiene) One of his main concerns is that she is still driving, and he does not feel that she should be on the road. He and his wife has been unsuccessful with trying to solve this issue.   Pt's son is not on her DPR so I was unable to discuss her medical information with him. I told him that I would pass the message along to Dr. Sherrie Mustache and also reach out to the pt. Her LOV was 09/2019 and she does not see Dr. Sherrie Mustache until 10/2020. I was able to contact the pt to come in sooner to discuss issues. I told her it was for a follow up visit.  Pt's son does not want her to know that he has reached out to [place] about these concerns. I recommended that he and pt sign a DPR once she is seen in the office in a few weeks.   Trey Paula left his contact info as well [phone].   Dr. Sherrie Mustache, this is an FYI. Is there anything else you recommend? Please advise. Thanks!

## 2020-09-01 NOTE — Patient Instructions (Addendum)
.   Please review the attached list of medications and notify my office if there are any errors.   Please go to the lab draw station in [address] on the second floor of [facility] . Normal hours are 8:00am to 11:30am and 1:00pm to 4:00pm Monday through Friday   Start taking amlodipine 5mg  once a day for your blood prssure

## 2020-09-11 ENCOUNTER — Other Ambulatory Visit: Payer: Self-pay

## 2020-09-11 ENCOUNTER — Encounter: Payer: Self-pay | Admitting: Family Medicine

## 2020-09-11 ENCOUNTER — Ambulatory Visit (INDEPENDENT_AMBULATORY_CARE_PROVIDER_SITE_OTHER): Payer: Medicare Other | Admitting: Family Medicine

## 2020-09-11 VITALS — BP 189/97 | HR 71 | Temp 98.4°F | Resp 16 | Ht 65.24 in | Wt 128.0 lb

## 2020-09-11 DIAGNOSIS — Z23 Encounter for immunization: Secondary | ICD-10-CM

## 2020-09-11 DIAGNOSIS — M8580 Other specified disorders of bone density and structure, unspecified site: Secondary | ICD-10-CM | POA: Diagnosis not present

## 2020-09-11 DIAGNOSIS — R030 Elevated blood-pressure reading, without diagnosis of hypertension: Secondary | ICD-10-CM

## 2020-09-11 DIAGNOSIS — F5101 Primary insomnia: Secondary | ICD-10-CM | POA: Diagnosis not present

## 2020-09-11 DIAGNOSIS — R413 Other amnesia: Secondary | ICD-10-CM | POA: Diagnosis not present

## 2020-09-11 MED ORDER — AMLODIPINE BESYLATE 5 MG PO TABS: 5.0000 mg | ORAL_TABLET | Freq: Every day | ORAL | 3 refills | Status: DC

## 2020-09-11 NOTE — Progress Notes (Signed)
Established patient visit   Patient: Mary Mosley   DOB: 1941/05/26   79 y.o. Female  MRN: [id number] Visit Date: 09/11/2020  Today's healthcare provider: Mila Merry, MD   Chief Complaint  Patient presents with  . Follow-up   Subjective    HPI  Cognitive changes: Patient is here today for a follow up. Her son came by the office on 08/21/2020 expressing concerns of a decline in patient cognition.  Patient states her son recently moved in with her to help around the [place]. She states that she feels well and has no complaints today. She continue to take lorazepam to help sleep and reports it continues to be effective and she does not feel drowsy at all in the morning.     Medications: Outpatient Medications Prior to Visit  Medication Sig  . cholecalciferol (VITAMIN D) 1000 UNITS tablet Take 2 tablets by mouth daily.  Marland Kitchen LORazepam (ATIVAN) 1 MG tablet Take 1 tablet (1 mg total) by mouth at bedtime as needed.   No facility-administered medications prior to visit.    Review of Systems  All other systems reviewed and are negative.    Objective    BP (!) 189/97   Pulse 71   Temp 98.4 F (36.9 C)   Resp 16   Ht 5' 5.24" (1.657 m)   Wt 128 lb (58.1 kg)   BMI 21.14 kg/m   Physical Exam   General: Appearance:    Well developed, well nourished female in no acute distress  Eyes:    PERRL, conjunctiva/corneas clear, EOM's intact       Lungs:     Clear to auscultation bilaterally, respirations unlabored  Heart:    Normal heart rate. Normal rhythm. No murmurs, rubs, or gallops.   MS:   All extremities are intact.   Neurologic:   Awake, alert, oriented x 3. No apparent focal neurological           defect.       MMSE - Mini Mental State Exam 09/11/2020  Orientation to time 5  Orientation to Place 5  Registration 3  Attention/ Calculation 5  Recall 3  Language- name 2 objects 2  Language- repeat 1  Language- follow 3 step  command 3  Language- read & follow direction 1  Write a sentence 1  Copy design 1  Total score 30     No results found for any visits on 09/11/20.  Assessment & Plan     1. Elevated blood pressure reading BP was checked several times in the office with manual and automatic blood pressure cuffs and SBP consistently over 180. Patient reports that she has had some anxiety about ill friends. Advised normal BP is less than 140 and need to start something for her BP even if it is temporary. Rx amlodipine 5mg  sent to her pharmacy. Check labs.  - CBC - Comprehensive metabolic panel - TSH  2. Osteopenia, unspecified location BMD next year.   3. Memory change separately reported to office staff by patient's son who did not want to disclose his concerns to the patient. However, she was alert, oriented, appropriately interactive with perfect MMSE score and no indication of cognitive impairment during her visit today. Consider reducing dose of lorazepam or change to something with less risk of cognitive effects.  - Vitamin B12  4. Need for influenza vaccination  - Flu Vaccine QUAD High Dose IM (  Fluad)       The entirety of the information documented in the History of Present Illness, Review of Systems and Physical Exam were personally obtained by me. Portions of this information were initially documented by the CMA and reviewed by me for thoroughness and accuracy.      Mila Merry, MD  [facility] [phone] (phone) [phone] (fax)  [facility]

## 2020-09-12 LAB — COMPREHENSIVE METABOLIC PANEL
ALT: 12 IU/L (ref 0–32)
AST: 26 IU/L (ref 0–40)
Albumin/Globulin Ratio: 2.2 (ref 1.2–2.2)
Albumin: 5.1 g/dL — ABNORMAL HIGH (ref 3.6–4.6)
Alkaline Phosphatase: 60 IU/L (ref 44–121)
BUN/Creatinine Ratio: 16 (ref 12–28)
BUN: 14 mg/dL (ref 8–27)
Bilirubin Total: 0.4 mg/dL (ref 0.0–1.2)
CO2: 25 mmol/L (ref 20–29)
Calcium: 10.5 mg/dL — ABNORMAL HIGH (ref 8.7–10.3)
Chloride: 101 mmol/L (ref 96–106)
Creatinine, Ser: 0.85 mg/dL (ref 0.57–1.00)
GFR calc Af Amer: 74 mL/min/{1.73_m2} (ref >59)
GFR calc non Af Amer: 64 mL/min/{1.73_m2} (ref >59)
Globulin, Total: 2.3 g/dL (ref 1.5–4.5)
Glucose: 103 mg/dL — ABNORMAL HIGH (ref 65–99)
Potassium: 4.5 mmol/L (ref 3.5–5.2)
Sodium: 139 mmol/L (ref 134–144)
Total Protein: 7.4 g/dL (ref 6.0–8.5)

## 2020-09-12 LAB — CBC
Hematocrit: 40.9 % (ref 34.0–46.6)
Hemoglobin: 13.8 g/dL (ref 11.1–15.9)
MCH: 31.3 pg (ref 26.6–33.0)
MCHC: 33.7 g/dL (ref 31.5–35.7)
MCV: 93 fL (ref 79–97)
Platelets: 170 10*3/uL (ref 150–450)
RBC: 4.41 x10E6/uL (ref 3.77–5.28)
RDW: 12.6 % (ref 11.7–15.4)
WBC: 5.3 10*3/uL (ref 3.4–10.8)

## 2020-09-12 LAB — TSH: TSH: 0.687 u[IU]/mL (ref 0.450–4.500)

## 2020-09-12 LAB — VITAMIN B12: Vitamin B-12: 930 pg/mL (ref 232–1245)

## 2020-09-16 ENCOUNTER — Telehealth: Payer: Self-pay

## 2020-09-16 NOTE — Telephone Encounter (Signed)
Pt returned call. States BP yesterday and today 120's/58-62, home monitor, arm cuff. Started amlodipine Sunday. "Just wanted Dr. Sherrie Mustache to know."

## 2020-09-16 NOTE — Telephone Encounter (Signed)
Copied from CRM [id number]. Topic: General - Other >> Sep 16, 2020  2:45 PM Elliot Gault wrote: Reason for CRM: Patient was seen 09/11/2020 and her BP was high., patient states PCP was concern. Patient wanted to inform PCP BP is WNL now

## 2020-09-16 NOTE — Telephone Encounter (Addendum)
Tried calling patient to find out more information. Need to know what her blood pressure readings have been running. Did she start taking the Amlodipine that was prescribed at her last office visit? Left message to call back. OK for [facility] triage to collect more information.

## 2020-09-30 DIAGNOSIS — H2513 Age-related nuclear cataract, bilateral: Secondary | ICD-10-CM | POA: Diagnosis not present

## 2020-10-12 ENCOUNTER — Other Ambulatory Visit: Payer: Self-pay

## 2020-10-12 ENCOUNTER — Encounter: Payer: Self-pay | Admitting: Family Medicine

## 2020-10-12 ENCOUNTER — Ambulatory Visit (INDEPENDENT_AMBULATORY_CARE_PROVIDER_SITE_OTHER): Payer: Medicare Other | Admitting: Family Medicine

## 2020-10-12 VITALS — BP 154/80 | HR 111 | Temp 98.1°F | Resp 16 | Wt 130.8 lb

## 2020-10-12 DIAGNOSIS — I10 Essential (primary) hypertension: Secondary | ICD-10-CM | POA: Diagnosis not present

## 2020-10-12 DIAGNOSIS — G4700 Insomnia, unspecified: Secondary | ICD-10-CM | POA: Diagnosis not present

## 2020-10-12 MED ORDER — LORAZEPAM 1 MG PO TABS: 1.0000 mg | ORAL_TABLET | Freq: Every evening | ORAL | 3 refills | Status: DC | PRN

## 2020-10-12 NOTE — Progress Notes (Signed)
Established patient visit   Patient: Mary Mosley   DOB: 09/15/1941   79 y.o. Female  MRN: [id number] Visit Date: 10/12/2020  Today's healthcare provider: Mila Merry, MD   Chief Complaint  Patient presents with  . Hypertension   Subjective    HPI  Hypertension, follow-up  BP Readings from Last 3 Encounters:  10/12/20 (!) 164/77  09/11/20 (!) 189/97  10/15/19 122/66   Wt Readings from Last 3 Encounters:  10/12/20 130 lb 12.8 oz (59.3 kg)  09/11/20 128 lb (58.1 kg)  10/15/19 125 lb (56.7 kg)     She was last seen for hypertension 1 months ago.  BP at that visit was 189/97. Management since that visit includes; Advised normal BP is less than 140 and need to start something for her BP even if it is temporary. Rx amlodipine 5mg  sent to her pharmacy. Labs checked showing-completely normal. Advised to go ahead and start amlodipine for blood pressure. Schedule follow up to check BP in 3-4 weeks.  She reports good compliance with treatment. She is having side effects. Has had some lightheadedness since starting medication. She is exercising. She is adherent to low salt diet.   Outside blood pressures are 120/60's.  She does not smoke.  Use of agents associated with hypertension: none.   ---------------------------------------------------------------------------------------------------  Memory change From 09/11/2020-separately reported to office staff by patient's son who did not want 09/13/2020 to disclose his concerns to the patient. However, she was alert, oriented, appropriately interactive with perfect MMSE score and no indication of cognitive impairment during her visit today. Consider reducing dose of lorazepam or change to something with less risk of cognitive effects.      Medications: Outpatient Medications Prior to Visit  Medication Sig  . amLODipine (NORVASC) 5 MG tablet Take 1 tablet (5 mg total) by mouth daily.  . cholecalciferol (VITAMIN D) 1000 UNITS tablet  Take 2 tablets by mouth daily.  [place] LORazepam (ATIVAN) 1 MG tablet Take 1 tablet (1 mg total) by mouth at bedtime as needed.   No facility-administered medications prior to visit.    Review of Systems  Constitutional: Negative for appetite change, chills, fatigue and fever.  Respiratory: Negative for chest tightness and shortness of breath.   Cardiovascular: Negative for chest pain and palpitations.  Gastrointestinal: Negative for abdominal pain, nausea and vomiting.  Neurological: Positive for light-headedness. Negative for dizziness and weakness.      Objective    BP (!) 164/77 (BP Location: Left Arm, Patient Position: Sitting, Cuff Size: Normal)   Pulse (!) 111   Temp 98.1 F (36.7 C) (Oral)   Resp 16   Wt 130 lb 12.8 oz (59.3 kg)   BMI 21.61 kg/m    Physical Exam  General appearance: Well developed, well nourished female, cooperative and in no acute distress Head: Normocephalic, without obvious abnormality, atraumatic Respiratory: Respirations even and unlabored, normal respiratory rate Extremities: All extremities are intact.  Skin: Skin color, texture, turgor normal. No rashes seen  Psych: Appropriate mood and affect. Neurologic: Mental status: Alert, oriented to person, place, and time, thought content appropriate.   No results found for any visits on 10/12/20.  Assessment & Plan     1. Primary hypertension Office BP still elevated, but home BP much better. Significantly improved since starting amlodipine. Continue current medications.  Follow up BP check about 4 months.          The entirety of the information documented in the History of Present  Illness, Review of Systems and Physical Exam were personally obtained by me. Portions of this information were initially documented by the CMA and reviewed by me for thoroughness and accuracy.      Lelon Huh, MD  [facility] [phone] (phone) [phone] (fax)  [place]

## 2020-10-26 ENCOUNTER — Encounter: Payer: Medicare Other | Admitting: Family Medicine

## 2020-11-24 ENCOUNTER — Telehealth: Payer: Medicare Other | Admitting: Family Medicine

## 2020-12-21 NOTE — Progress Notes (Unsigned)
Subjective:   NICKOL COLLISTER is a 80 y.o. female who presents for Medicare Annual (Subsequent) preventive examination.  I connected with Olam Idler today by telephone and verified that I am speaking with the correct person using two identifiers. Location patient: home Location provider: work Persons participating in the virtual visit: patient, provider.   I discussed the limitations, risks, security and privacy concerns of performing an evaluation and management service by telephone and the availability of in person appointments. I also discussed with the patient that there may be a patient responsible charge related to this service. The patient expressed understanding and verbally consented to this telephonic visit.    Interactive audio and video telecommunications were attempted between this provider and patient, however failed, due to patient having technical difficulties OR patient did not have access to video capability.  We continued and completed visit with audio only.   Review of Systems    N/A  Cardiac Risk Factors include: advanced age (>54men, >8 women);hypertension     Objective:    There were no vitals filed for this visit. There is no height or weight on file to calculate BMI.  Advanced Directives 12/22/2020 07/30/2019 07/24/2018 07/21/2017  Does Patient Have a Medical Advance Directive? Yes Yes Yes Yes  Type of Estate agent of [place];Living will Healthcare Power of [place];Living will Healthcare Power of [place];Living will Healthcare Power of [place];Living will  Copy of Healthcare Power of Attorney in Chart? Yes - validated most recent copy scanned in chart (See row information) No - copy requested No - copy requested No - copy requested    Current Medications (verified) Outpatient Encounter Medications as of 12/22/2020  Medication Sig  . amLODipine (NORVASC) 5 MG tablet Take 1 tablet (5 mg total) by mouth daily.  . cholecalciferol (VITAMIN  D) 1000 UNITS tablet Take 2 tablets by mouth daily.  Marland Kitchen LORazepam (ATIVAN) 1 MG tablet Take 1 tablet (1 mg total) by mouth at bedtime as needed.   No facility-administered encounter medications on file as of 12/22/2020.    Allergies (verified) Fosamax [alendronate sodium] and Penicillins   History: Past Medical History:  Diagnosis Date  . Hypertension   . OSA (obstructive sleep apnea)   . Osteopenia    Past Surgical History:  Procedure Laterality Date  . EYE SURGERY Bilateral    due to trauma  . TONSILLECTOMY AND ADENOIDECTOMY     Family History  Problem Relation Age of Onset  . Heart attack Mother   . Lung cancer Brother   . Heart Problems Brother   . Heart Problems Brother        Has stents  . Liver cancer Son   . Breast cancer Neg Hx    Social History   Socioeconomic History  . Marital status: Widowed    Spouse name: Not on file  . Number of children: 2  . Years of education: Not on file  . Highest education level: Some college, no degree  Occupational History  . Occupation: Retired  Tobacco Use  . Smoking status: Never Smoker  . Smokeless tobacco: Never Used  Vaping Use  . Vaping Use: Never used  Substance and Sexual Activity  . Alcohol use: No    Alcohol/week: 0.0 standard drinks  . Drug use: No  . Sexual activity: Not on file  Other Topics Concern  . Not on file  Social History Narrative  . Not on file   Social Determinants of Health   Financial Resource  Strain: Low Risk   . Difficulty of Paying Living Expenses: Not hard at all  Food Insecurity: No Food Insecurity  . Worried About Programme researcher, broadcasting/film/video in the Last Year: Never true  . Ran Out of Food in the Last Year: Never true  Transportation Needs: No Transportation Needs  . Lack of Transportation (Medical): No  . Lack of Transportation (Non-Medical): No  Physical Activity: Inactive  . Days of Exercise per Week: 0 days  . Minutes of Exercise per Session: 0 min  Stress: No Stress Concern Present   . Feeling of Stress : Not at all  Social Connections: Moderately Integrated  . Frequency of Communication with Friends and Family: More than three times a week  . Frequency of Social Gatherings with Friends and Family: More than three times a week  . Attends Religious Services: More than 4 times per year  . Active Member of Clubs or Organizations: Yes  . Attends Banker Meetings: More than 4 times per year  . Marital Status: Widowed    Tobacco Counseling Counseling given: Not Answered   Clinical Intake:  Pre-visit preparation completed: Yes  Pain : No/denies pain     Nutritional Risks: None  How often do you need to have someone help you when you read instructions, pamphlets, or other written materials from your doctor or pharmacy?: 1 - Never  Diabetic? No  Interpreter Needed?: No  Information entered by :: [facility], LPN   Activities of Daily Living In your present state of health, do you have any difficulty performing the following activities: 12/22/2020 09/11/2020  Hearing? N N  Vision? N N  Difficulty concentrating or making decisions? N N  Walking or climbing stairs? N N  Dressing or bathing? N N  Doing errands, shopping? N N  Preparing Food and eating ? N -  Using the Toilet? N -  In the past six months, have you accidently leaked urine? N -  Do you have problems with loss of bowel control? N -  Managing your Medications? N -  Managing your Finances? N -  Housekeeping or managing your Housekeeping? N -  Some recent data might be hidden    Patient Care Team: Malva Limes, MD as PCP - General (Family Medicine) [place] Crane, MD as Consulting Physician (Ophthalmology)  Indicate any recent Medical Services you may have received from other than [facility] providers in the past year (date may be approximate).     Assessment:   This is a routine wellness examination for Zafiro.  Hearing/Vision screen No exam data present  Dietary issues  and exercise activities discussed: Current Exercise Habits: The patient does not participate in regular exercise at present, Exercise limited by: None identified  Goals    . Increase water intake     Recommend increasing water intake to 6-8 glasses of water a day.       Depression Screen PHQ 2/9 Scores 12/22/2020 09/11/2020 07/30/2019 07/24/2018 07/21/2017 09/15/2016 09/07/2015  PHQ - 2 Score 0 0 0 0 0 0 0  PHQ- 9 Score - 1 - - - 0 1    Fall Risk Fall Risk  12/22/2020 09/11/2020 10/15/2019 07/30/2019 07/24/2018  Falls in the past year? 0 0 0 0 No  Number falls in past yr: 0 0 0 - -  Injury with Fall? 0 0 0 - -  Follow up - - Falls evaluation completed - -    FALL RISK PREVENTION PERTAINING TO THE HOME:  Any stairs in or around the home? Yes  If so, are there any without handrails? No  Home free of loose throw rugs in walkways, pet beds, electrical cords, etc? Yes  Adequate lighting in your home to reduce risk of falls? Yes   ASSISTIVE DEVICES UTILIZED TO PREVENT FALLS:  Life alert? No  Use of a cane, walker or w/c? No  Grab bars in the bathroom? No  Shower chair or bench in shower? No  Elevated toilet seat or a handicapped toilet? No    Cognitive Function: MMSE - Mini Mental State Exam 09/11/2020  Orientation to time 5  Orientation to Place 5  Registration 3  Attention/ Calculation 5  Recall 3  Language- name 2 objects 2  Language- repeat 1  Language- follow 3 step command 3  Language- read & follow direction 1  Write a sentence 1  Copy design 1  Total score 30     6CIT Screen 07/30/2019 07/21/2017  What Year? 0 points 0 points  What month? 0 points 0 points  What time? 0 points 0 points  Count back from 20 0 points 0 points  Months in reverse 0 points 0 points  Repeat phrase 0 points 4 points  Total Score 0 4    Immunizations Immunization History  Administered Date(s) Administered  . Fluad Quad(high Dose 65+) 09/11/2020  . Influenza, High Dose Seasonal PF  09/07/2015, 09/15/2016, 07/21/2017, 07/24/2018, 09/16/2019  . PFIZER(Purple Top)SARS-COV-2 Vaccination 09/22/2020, 10/13/2020  . Pneumococcal Conjugate-13 09/04/2014  . Pneumococcal Polysaccharide-23 01/08/2004    TDAP status: Due, Education has been provided regarding the importance of this vaccine. Advised may receive this vaccine at local pharmacy or Health Dept. Aware to provide a copy of the vaccination record if obtained from local pharmacy or Health Dept. Verbalized acceptance and understanding.  Flu Vaccine status: Up to date  Pneumococcal vaccine status: Up to date  Covid-19 vaccine status: Completed vaccines  Qualifies for Shingles Vaccine? Yes   Zostavax completed No   Shingrix Completed?: No.    Education has been provided regarding the importance of this vaccine. Patient has been advised to call insurance company to determine out of pocket expense if they have not yet received this vaccine. Advised may also receive vaccine at local pharmacy or Health Dept. Verbalized acceptance and understanding.  Screening Tests Health Maintenance  Topic Date Due  . TETANUS/TDAP  11/21/2026 (Originally 01/07/1960)  . COVID-19 Vaccine (3 - Booster for Pfizer series) 04/12/2021  . DEXA SCAN  10/29/2021  . INFLUENZA VACCINE  Completed  . PNA vac Low Risk Adult  Completed    Health Maintenance  There are no preventive care reminders to display for this patient.  Colorectal cancer screening: No longer required.   Mammogram status: No longer required due to age.  Bone Density status: Completed 10/29/18. Results reflect: Bone density results: OSTEOPENIA. Repeat every 3 years.  Lung Cancer Screening: (Low Dose CT Chest recommended if Age 18-80 years, 30 pack-year currently smoking OR have quit w/in 15years.) does not qualify.    Additional Screening:  Vision Screening: Recommended annual ophthalmology exams for early detection of glaucoma and other disorders of the eye. Is the patient  up to date with their annual eye exam?  Yes  Who is the provider or what is the name of the office in which the patient attends annual eye exams? Dr Brooke Dare @ [facility] If pt is not established with a provider, would they like to be referred to a provider to  establish care? No .   Dental Screening: Recommended annual dental exams for proper oral hygiene  Community Resource Referral / Chronic Care Management: CRR required this visit?  No   CCM required this visit?  No      Plan:     I have personally reviewed and noted the following in the patient's chart:   . Medical and social history . Use of alcohol, tobacco or illicit drugs  . Current medications and supplements . Functional ability and status . Nutritional status . Physical activity . Advanced directives . List of other physicians . Hospitalizations, surgeries, and ER visits in previous 12 months . Vitals . Screenings to include cognitive, depression, and falls . Referrals and appointments  In addition, I have reviewed and discussed with patient certain preventive protocols, quality metrics, and best practice recommendations. A written personalized care plan for preventive services as well as general preventive health recommendations were provided to patient.     Makyna Niehoff [place], [place]   01/22/3544   Nurse Notes: None.

## 2020-12-22 ENCOUNTER — Ambulatory Visit (INDEPENDENT_AMBULATORY_CARE_PROVIDER_SITE_OTHER): Payer: Medicare Other

## 2020-12-22 ENCOUNTER — Other Ambulatory Visit: Payer: Self-pay

## 2020-12-22 DIAGNOSIS — Z0000 Encounter for general adult medical examination without abnormal findings: Secondary | ICD-10-CM | POA: Diagnosis not present

## 2020-12-22 NOTE — Patient Instructions (Signed)
Mary Mosley , Thank you for taking time to come for your Medicare Wellness Visit. I appreciate your ongoing commitment to your health goals. Please review the following plan we discussed and let me know if I can assist you in the future.   Screening recommendations/referrals: Colonoscopy: No longer required.  Mammogram: No longer required.  Bone Density: Up to date, due 10/29/21 Recommended yearly ophthalmology/optometry visit for glaucoma screening and checkup Recommended yearly dental visit for hygiene and checkup  Vaccinations: Influenza vaccine: Done 09/11/20 Pneumococcal vaccine: Completed series Tdap vaccine: Currently due, declined receiving.  Shingles vaccine: Shingrix discussed. Please contact your pharmacy for coverage information.     Advanced directives: Currently on file.  Conditions/risks identified: Recommend increasing water intake to 6-8 glasses of water a day.   Next appointment: 02/09/21 @ 10:20 AM with Dr Sherrie Mustache    Preventive Care 65 Years and Older, Female Preventive care refers to lifestyle choices and visits with your health care provider that can promote health and wellness. What does preventive care include?  A yearly physical exam. This is also called an annual well check.  Dental exams once or twice a year.  Routine eye exams. Ask your health care provider how often you should have your eyes checked.  Personal lifestyle choices, including:  Daily care of your teeth and gums.  Regular physical activity.  Eating a healthy diet.  Avoiding tobacco and drug use.  Limiting alcohol use.  Practicing safe sex.  Taking low-dose aspirin every day.  Taking vitamin and mineral supplements as recommended by your health care provider. What happens during an annual well check? The services and screenings done by your health care provider during your annual well check will depend on your age, overall health, lifestyle risk factors, and family history of  disease. Counseling  Your health care provider may ask you questions about your:  Alcohol use.  Tobacco use.  Drug use.  Emotional well-being.  Home and relationship well-being.  Sexual activity.  Eating habits.  History of falls.  Memory and ability to understand (cognition).  Work and work Astronomer.  Reproductive health. Screening  You may have the following tests or measurements:  Height, weight, and BMI.  Blood pressure.  Lipid and cholesterol levels. These may be checked every 5 years, or more frequently if you are over 18 years old.  Skin check.  Lung cancer screening. You may have this screening every year starting at age 26 if you have a 30-pack-year history of smoking and currently smoke or have quit within the past 15 years.  Fecal occult blood test (FOBT) of the stool. You may have this test every year starting at age 38.  Flexible sigmoidoscopy or colonoscopy. You may have a sigmoidoscopy every 5 years or a colonoscopy every 10 years starting at age 48.  Hepatitis C blood test.  Hepatitis B blood test.  Sexually transmitted disease (STD) testing.  Diabetes screening. This is done by checking your blood sugar (glucose) after you have not eaten for a while (fasting). You may have this done every 1-3 years.  Bone density scan. This is done to screen for osteoporosis. You may have this done starting at age 82.  Mammogram. This may be done every 1-2 years. Talk to your health care provider about how often you should have regular mammograms. Talk with your health care provider about your test results, treatment options, and if necessary, the need for more tests. Vaccines  Your health care provider may recommend certain vaccines,  such as:  Influenza vaccine. This is recommended every year.  Tetanus, diphtheria, and acellular pertussis (Tdap, Td) vaccine. You may need a Td booster every 10 years.  Zoster vaccine. You may need this after age  36.  Pneumococcal 13-valent conjugate (PCV13) vaccine. One dose is recommended after age 64.  Pneumococcal polysaccharide (PPSV23) vaccine. One dose is recommended after age 89. Talk to your health care provider about which screenings and vaccines you need and how often you need them. This information is not intended to replace advice given to you by your health care provider. Make sure you discuss any questions you have with your health care provider. Document Released: 12/04/2015 Document Revised: 07/27/2016 Document Reviewed: 09/08/2015 Elsevier Interactive Patient Education  2017 [place] Prevention in the Home Falls can cause injuries. They can happen to people of all ages. There are many things you can do to make your home safe and to help prevent falls. What can I do on the outside of my home?  Regularly fix the edges of walkways and driveways and fix any cracks.  Remove anything that might make you trip as you walk through a door, such as a raised step or threshold.  Trim any bushes or trees on the path to your home.  Use bright outdoor lighting.  Clear any walking paths of anything that might make someone trip, such as rocks or tools.  Regularly check to see if handrails are loose or broken. Make sure that both sides of any steps have handrails.  Any raised decks and porches should have guardrails on the edges.  Have any leaves, snow, or ice cleared regularly.  Use sand or salt on walking paths during winter.  Clean up any spills in your garage right away. This includes oil or grease spills. What can I do in the bathroom?  Use night lights.  Install grab bars by the toilet and in the tub and shower. Do not use towel bars as grab bars.  Use non-skid mats or decals in the tub or shower.  If you need to sit down in the shower, use a plastic, non-slip stool.  Keep the floor dry. Clean up any water that spills on the floor as soon as it happens.  Remove  soap buildup in the tub or shower regularly.  Attach bath mats securely with double-sided non-slip rug tape.  Do not have throw rugs and other things on the floor that can make you trip. What can I do in the bedroom?  Use night lights.  Make sure that you have a light by your bed that is easy to reach.  Do not use any sheets or blankets that are too big for your bed. They should not hang down onto the floor.  Have a firm chair that has side arms. You can use this for support while you get dressed.  Do not have throw rugs and other things on the floor that can make you trip. What can I do in the kitchen?  Clean up any spills right away.  Avoid walking on wet floors.  Keep items that you use a lot in easy-to-reach places.  If you need to reach something above you, use a strong step stool that has a grab bar.  Keep electrical cords out of the way.  Do not use floor polish or wax that makes floors slippery. If you must use wax, use non-skid floor wax.  Do not have throw rugs and other things on  the floor that can make you trip. What can I do with my stairs?  Do not leave any items on the stairs.  Make sure that there are handrails on both sides of the stairs and use them. Fix handrails that are broken or loose. Make sure that handrails are as long as the stairways.  Check any carpeting to make sure that it is firmly attached to the stairs. Fix any carpet that is loose or worn.  Avoid having throw rugs at the top or bottom of the stairs. If you do have throw rugs, attach them to the floor with carpet tape.  Make sure that you have a light switch at the top of the stairs and the bottom of the stairs. If you do not have them, ask someone to add them for you. What else can I do to help prevent falls?  Wear shoes that:  Do not have high heels.  Have rubber bottoms.  Are comfortable and fit you well.  Are closed at the toe. Do not wear sandals.  If you use a  stepladder:  Make sure that it is fully opened. Do not climb a closed stepladder.  Make sure that both sides of the stepladder are locked into place.  Ask someone to hold it for you, if possible.  Clearly mark and make sure that you can see:  Any grab bars or handrails.  First and last steps.  Where the edge of each step is.  Use tools that help you move around (mobility aids) if they are needed. These include:  Canes.  Walkers.  Scooters.  Crutches.  Turn on the lights when you go into a dark area. Replace any light bulbs as soon as they burn out.  Set up your furniture so you have a clear path. Avoid moving your furniture around.  If any of your floors are uneven, fix them.  If there are any pets around you, be aware of where they are.  Review your medicines with your doctor. Some medicines can make you feel dizzy. This can increase your chance of falling. Ask your doctor what other things that you can do to help prevent falls. This information is not intended to replace advice given to you by your health care provider. Make sure you discuss any questions you have with your health care provider. Document Released: 09/03/2009 Document Revised: 04/14/2016 Document Reviewed: 12/12/2014 Elsevier Interactive Patient Education  2017 [employer].

## 2021-02-09 ENCOUNTER — Other Ambulatory Visit: Payer: Self-pay

## 2021-02-09 ENCOUNTER — Encounter: Payer: Self-pay | Admitting: Family Medicine

## 2021-02-09 ENCOUNTER — Ambulatory Visit (INDEPENDENT_AMBULATORY_CARE_PROVIDER_SITE_OTHER): Payer: Medicare Other | Admitting: Family Medicine

## 2021-02-09 VITALS — BP 136/58 | HR 73 | Temp 97.0°F | Resp 16 | Wt 130.6 lb

## 2021-02-09 DIAGNOSIS — I10 Essential (primary) hypertension: Secondary | ICD-10-CM | POA: Insufficient documentation

## 2021-02-09 DIAGNOSIS — G4700 Insomnia, unspecified: Secondary | ICD-10-CM

## 2021-02-09 MED ORDER — LORAZEPAM 1 MG PO TABS: 1.0000 mg | ORAL_TABLET | Freq: Every evening | ORAL | 3 refills | Status: DC | PRN

## 2021-02-09 NOTE — Progress Notes (Signed)
Established patient visit   Patient: Mary Mosley   DOB: 12-12-1940   80 y.o. Female  MRN: [id number] Visit Date: 02/09/2021  Today's healthcare provider: Mila Merry, MD   Chief Complaint  Patient presents with  . Hypertension   Subjective    HPI  Hypertension, follow-up  BP Readings from Last 3 Encounters:  02/09/21 (!) 156/70  10/12/20 (!) 154/80  09/11/20 (!) 189/97   Wt Readings from Last 3 Encounters:  02/09/21 130 lb 9.6 oz (59.2 kg)  10/12/20 130 lb 12.8 oz (59.3 kg)  09/11/20 128 lb (58.1 kg)     She was last seen for hypertension 4 months ago.  BP at that visit was 154/80. Management since that visit includes continuing same medications.  She reports good compliance with treatment. She is not having side effects.  She is following a Regular diet. She is exercising. She does not smoke.  Use of agents associated with hypertension: none.   Outside blood pressures are 120/ 60's. Symptoms: No chest pain No chest pressure  No palpitations No syncope  No dyspnea No orthopnea  No paroxysmal nocturnal dyspnea No lower extremity edema   Pertinent labs: Lab Results  Component Value Date   CHOL 216 (H) 10/16/2019   HDL 78 10/16/2019   LDLCALC 124 (H) 10/16/2019   TRIG 80 10/16/2019   CHOLHDL 2.8 10/16/2019   Lab Results  Component Value Date   NA 139 09/11/2020   K 4.5 09/11/2020   CREATININE 0.85 09/11/2020   GFRNONAA 64 09/11/2020   GFRAA 74 09/11/2020   GLUCOSE 103 (H) 09/11/2020     The ASCVD Risk score (Goff DC Jr., et al., 2013) failed to calculate for the following reasons:   The 2013 ASCVD risk score is only valid for ages 64 to 30   ---------------------------------------------------------------------------------------------------  Follow up for Insomnia:  The patient was last seen for this 4 months ago. Changes made at last visit include none; continue Lorazepam as needed at bedtime.  She reports good compliance with  treatment. She feels that condition is Unchanged. She is not having side effects.   -----------------------------------------------------------------------------------------      Medications: Outpatient Medications Prior to Visit  Medication Sig  . amLODipine (NORVASC) 5 MG tablet Take 1 tablet (5 mg total) by mouth daily.  . cholecalciferol (VITAMIN D) 1000 UNITS tablet Take 2 tablets by mouth daily.  Marland Kitchen LORazepam (ATIVAN) 1 MG tablet Take 1 tablet (1 mg total) by mouth at bedtime as needed.   No facility-administered medications prior to visit.    Review of Systems  Constitutional: Negative for appetite change, chills, fatigue and fever.  Respiratory: Negative for chest tightness and shortness of breath.   Cardiovascular: Negative for chest pain and palpitations.  Gastrointestinal: Negative for abdominal pain, nausea and vomiting.  Neurological: Negative for dizziness and weakness.       Objective    BP (!) 136/58   Pulse 73   Temp (!) 97 F (36.1 C) (Temporal)   Resp 16   Wt 130 lb 9.6 oz (59.2 kg)   BMI 21.57 kg/m     Physical Exam   General appearance: Well developed, well nourished female, cooperative and in no acute distress Head: Normocephalic, without obvious abnormality, atraumatic Respiratory: Respirations even and unlabored, normal respiratory rate Extremities: All extremities are intact.  Skin: Skin color, texture, turgor normal. No rashes seen  Psych: Appropriate mood and affect. Neurologic: Mental status: Alert, oriented to person, place, and  time, thought content appropriate.     Assessment & Plan     1. Insomnia, unspecified type Doing very well with occasional- LORazepam (ATIVAN) 1 MG tablet; Take 1 tablet (1 mg total) by mouth at bedtime as needed.  Dispense: 30 tablet; Refill: 3  2. Primary hypertension Well controlled.  Continue current medications.     No follow-ups on file.         Mila Merry, MD  [facility] [phone] (phone) [phone] (fax)  [facility]

## 2021-04-01 DIAGNOSIS — H2513 Age-related nuclear cataract, bilateral: Secondary | ICD-10-CM | POA: Diagnosis not present

## 2021-04-09 DIAGNOSIS — L239 Allergic contact dermatitis, unspecified cause: Secondary | ICD-10-CM | POA: Diagnosis not present

## 2021-04-14 DIAGNOSIS — L239 Allergic contact dermatitis, unspecified cause: Secondary | ICD-10-CM | POA: Diagnosis not present

## 2021-05-03 ENCOUNTER — Other Ambulatory Visit: Payer: Self-pay | Admitting: Family Medicine

## 2021-05-03 DIAGNOSIS — Z1231 Encounter for screening mammogram for malignant neoplasm of breast: Secondary | ICD-10-CM

## 2021-05-25 DIAGNOSIS — H2511 Age-related nuclear cataract, right eye: Secondary | ICD-10-CM | POA: Diagnosis not present

## 2021-05-27 ENCOUNTER — Encounter: Payer: Self-pay | Admitting: Ophthalmology

## 2021-06-07 ENCOUNTER — Ambulatory Visit: Payer: Medicare Other | Admitting: Anesthesiology

## 2021-06-07 ENCOUNTER — Encounter: Payer: Self-pay | Admitting: Ophthalmology

## 2021-06-07 ENCOUNTER — Ambulatory Visit
Admission: RE | Admit: 2021-06-07 | Discharge: 2021-06-07 | Disposition: A | Discharge status: Home / Self Care | Payer: Medicare Other | Attending: Ophthalmology | Admitting: Ophthalmology

## 2021-06-07 ENCOUNTER — Encounter
Admission: RE | Disposition: A | Discharge status: Home / Self Care | Payer: Self-pay | Source: Home / Self Care | Attending: Ophthalmology

## 2021-06-07 ENCOUNTER — Other Ambulatory Visit: Payer: Self-pay

## 2021-06-07 DIAGNOSIS — Z888 Allergy status to other drugs, medicaments and biological substances status: Secondary | ICD-10-CM | POA: Insufficient documentation

## 2021-06-07 DIAGNOSIS — Z8249 Family history of ischemic heart disease and other diseases of the circulatory system: Secondary | ICD-10-CM | POA: Insufficient documentation

## 2021-06-07 DIAGNOSIS — I10 Essential (primary) hypertension: Secondary | ICD-10-CM | POA: Insufficient documentation

## 2021-06-07 DIAGNOSIS — G4733 Obstructive sleep apnea (adult) (pediatric): Secondary | ICD-10-CM | POA: Diagnosis not present

## 2021-06-07 DIAGNOSIS — H25811 Combined forms of age-related cataract, right eye: Secondary | ICD-10-CM | POA: Diagnosis not present

## 2021-06-07 DIAGNOSIS — Z79899 Other long term (current) drug therapy: Secondary | ICD-10-CM | POA: Diagnosis not present

## 2021-06-07 DIAGNOSIS — Z880 Allergy status to penicillin: Secondary | ICD-10-CM | POA: Diagnosis not present

## 2021-06-07 DIAGNOSIS — H2511 Age-related nuclear cataract, right eye: Secondary | ICD-10-CM | POA: Diagnosis not present

## 2021-06-07 HISTORY — PX: CATARACT EXTRACTION W/PHACO: SHX586

## 2021-06-07 SURGERY — PHACOEMULSIFICATION, CATARACT, WITH IOL INSERTION
Anesthesia: Monitor Anesthesia Care | Site: Eye | Laterality: Right

## 2021-06-07 MED ORDER — PHENYLEPHRINE HCL 10 % OP SOLN
1.0000 [drp] | OPHTHALMIC | Status: DC | PRN
  Administered 2021-06-07 (×3): 1 [drp] via OPHTHALMIC

## 2021-06-07 MED ORDER — LIDOCAINE HCL (PF) 2 % IJ SOLN
INTRAOCULAR | Status: DC | PRN
  Administered 2021-06-07: 1 mL via INTRAOCULAR

## 2021-06-07 MED ORDER — SIGHTPATH DOSE#1 SODIUM HYALURONATE 10 MG/ML IO SOLUTION
PREFILLED_SYRINGE | INTRAOCULAR | Status: DC | PRN
  Administered 2021-06-07: 0.85 mL via INTRAOCULAR

## 2021-06-07 MED ORDER — FENTANYL CITRATE (PF) 100 MCG/2ML IJ SOLN
INTRAMUSCULAR | Status: DC | PRN
  Administered 2021-06-07: 50 ug via INTRAVENOUS

## 2021-06-07 MED ORDER — SIGHTPATH DOSE#1 BSS IO SOLN
INTRAOCULAR | Status: DC | PRN
  Administered 2021-06-07: 15 mL

## 2021-06-07 MED ORDER — SIGHTPATH DOSE#1 BSS IO SOLN
INTRAOCULAR | Status: DC | PRN
  Administered 2021-06-07: 90 mL via OPHTHALMIC

## 2021-06-07 MED ORDER — MIDAZOLAM HCL 2 MG/2ML IJ SOLN
INTRAMUSCULAR | Status: DC | PRN
  Administered 2021-06-07: 1 mg via INTRAVENOUS

## 2021-06-07 MED ORDER — ACETAMINOPHEN 160 MG/5ML PO SOLN: 325.0000 mg | Freq: Once | ORAL | Status: DC

## 2021-06-07 MED ORDER — CYCLOPENTOLATE HCL 2 % OP SOLN
1.0000 [drp] | OPHTHALMIC | Status: DC | PRN
  Administered 2021-06-07 (×3): 1 [drp] via OPHTHALMIC

## 2021-06-07 MED ORDER — TETRACAINE HCL 0.5 % OP SOLN
1.0000 [drp] | OPHTHALMIC | Status: DC | PRN
  Administered 2021-06-07 (×3): 1 [drp] via OPHTHALMIC

## 2021-06-07 MED ORDER — SIGHTPATH DOSE#1 SODIUM HYALURONATE 23 MG/ML IO SOLUTION
PREFILLED_SYRINGE | INTRAOCULAR | Status: DC | PRN
  Administered 2021-06-07: 0.55 mL via INTRAOCULAR

## 2021-06-07 MED ORDER — MOXIFLOXACIN HCL 0.5 % OP SOLN
OPHTHALMIC | Status: DC | PRN
  Administered 2021-06-07: 0.2 mL via OPHTHALMIC

## 2021-06-07 MED ORDER — LACTATED RINGERS IV SOLN: INTRAVENOUS | Status: DC

## 2021-06-07 MED ORDER — ACETAMINOPHEN 325 MG PO TABS: 325.0000 mg | ORAL_TABLET | Freq: Once | ORAL | Status: DC

## 2021-06-07 SURGICAL SUPPLY — 14 items
CANNULA ANT/CHMB 27GA (MISCELLANEOUS) ×4 IMPLANT
DISSECTOR HYDRO NUCLEUS 50X22 (MISCELLANEOUS) ×2 IMPLANT
GLOVE SURG ENC TEXT LTX SZ7.5 (GLOVE) ×2 IMPLANT
GLOVE SURG SYN 8.5  E (GLOVE) ×2
GLOVE SURG SYN 8.5 E (GLOVE) ×1 IMPLANT
GOWN STRL REUS W/ TWL LRG LVL3 (GOWN DISPOSABLE) ×2 IMPLANT
GOWN STRL REUS W/TWL LRG LVL3 (GOWN DISPOSABLE) ×4
LENS IOL TECNIS EYHANCE 19.5 (Intraocular Lens) ×2 IMPLANT
MARKER SKIN DUAL TIP RULER LAB (MISCELLANEOUS) ×2 IMPLANT
PACK EYE AFTER SURG (MISCELLANEOUS) ×2 IMPLANT
SYR 3ML LL SCALE MARK (SYRINGE) ×2 IMPLANT
SYR TB 1ML LUER SLIP (SYRINGE) ×2 IMPLANT
WATER STERILE IRR 250ML POUR (IV SOLUTION) ×2 IMPLANT
WIPE NON LINTING 3.25X3.25 (MISCELLANEOUS) ×2 IMPLANT

## 2021-06-07 NOTE — H&P (Signed)
[facility]   Primary Care Physician:  Malva Limes, MD Ophthalmologist: Dr. Willey Blade  Pre-Procedure History & Physical: HPI:  Mary Mosley is a 80 y.o. female here for cataract surgery.   Past Medical History:  Diagnosis Date   Hypertension    OSA (obstructive sleep apnea)    pt denies   Osteopenia     Past Surgical History:  Procedure Laterality Date   EYE SURGERY Bilateral    due to trauma   TONSILLECTOMY AND ADENOIDECTOMY      Prior to Admission medications   Medication Sig Start Date End Date Taking? Authorizing Provider  amLODipine (NORVASC) 5 MG tablet Take 1 tablet (5 mg total) by mouth daily. 09/11/20  Yes Malva Limes, MD  cholecalciferol (VITAMIN D) 1000 UNITS tablet Take 2 tablets by mouth daily.   Yes [provider]  LORazepam (ATIVAN) 1 MG tablet Take 1 tablet (1 mg total) by mouth at bedtime as needed. 02/09/21  Yes Malva Limes, MD    Allergies as of 04/13/2021 - Review Complete 02/09/2021  Allergen Reaction Noted   Fosamax [alendronate sodium] Itching 01/21/2019   Penicillins  09/03/2015    Family History  Problem Relation Age of Onset   Heart attack Mother    Lung cancer Brother    Heart Problems Brother    Heart Problems Brother        Has stents   Liver cancer Son    Breast cancer Neg Hx     Social History   Socioeconomic History   Marital status: Widowed    Spouse name: Not on file   Number of children: 2   Years of education: Not on file   Highest education level: Some college, no degree  Occupational History   Occupation: Retired  Tobacco Use   Smoking status: Never   Smokeless tobacco: Never  Vaping Use   Vaping Use: Never used  Substance and Sexual Activity   Alcohol use: No    Alcohol/week: 0.0 standard drinks   Drug use: No   Sexual activity: Not on file  Other Topics Concern   Not on file  Social History Narrative   Not on file   Social Determinants of Health   Financial Resource  Strain: Low Risk    Difficulty of Paying Living Expenses: Not hard at all  Food Insecurity: No Food Insecurity   Worried About Programme researcher, broadcasting/film/video in the Last Year: Never true   Ran Out of Food in the Last Year: Never true  Transportation Needs: No Transportation Needs   Lack of Transportation (Medical): No   Lack of Transportation (Non-Medical): No  Physical Activity: Inactive   Days of Exercise per Week: 0 days   Minutes of Exercise per Session: 0 min  Stress: No Stress Concern Present   Feeling of Stress : Not at all  Social Connections: Moderately Integrated   Frequency of Communication with Friends and Family: More than three times a week   Frequency of Social Gatherings with Friends and Family: More than three times a week   Attends Religious Services: More than 4 times per year   Active Member of [employer] or Organizations: Yes   Attends Banker Meetings: More than 4 times per year   Marital Status: Widowed  Catering manager Violence: Not At Risk   Fear of Current or Ex-Partner: No   Emotionally Abused: No   Physically Abused: No   Sexually Abused: No  Review of Systems: See HPI, otherwise negative ROS  Physical Exam: BP (!) 183/85   Pulse 85   Temp 97.7 F (36.5 C) (Temporal)   Resp 18   Ht 5\' 8"  (1.727 m)   Wt 58.1 kg   SpO2 98%   BMI 19.46 kg/m  General:   Alert, cooperative in NAD Head:  Normocephalic and atraumatic. Respiratory:  Normal work of breathing. Cardiovascular:  RRR  Impression/Plan: Mary Mosley is here for cataract surgery.  Risks, benefits, limitations, and alternatives regarding cataract surgery have been reviewed with the patient.  Questions have been answered.  All parties agreeable.   [name], MD  06/07/2021, 11:56 AM

## 2021-06-07 NOTE — Transfer of Care (Signed)
Immediate Anesthesia Transfer of Care Note  Patient: Mary Mosley  Procedure(s) Performed: CATARACT EXTRACTION PHACO AND INTRAOCULAR LENS PLACEMENT (IOC) RIGHT (Right: Eye)  Patient Location: PACU  Anesthesia Type: MAC  Level of Consciousness: awake, alert  and patient cooperative  Airway and Oxygen Therapy: Patient Spontanous Breathing and Patient connected to supplemental oxygen  Post-op Assessment: Post-op Vital signs reviewed, Patient's Cardiovascular Status Stable, Respiratory Function Stable, Patent Airway and No signs of Nausea or vomiting  Post-op Vital Signs: Reviewed and stable  Complications: No notable events documented.

## 2021-06-07 NOTE — Anesthesia Postprocedure Evaluation (Signed)
Anesthesia Post Note  Patient: Mary Mosley  Procedure(s) Performed: CATARACT EXTRACTION PHACO AND INTRAOCULAR LENS PLACEMENT (IOC) RIGHT (Right: Eye)     Patient location during evaluation: PACU Anesthesia Type: MAC Level of consciousness: awake and alert and oriented Pain management: satisfactory to patient Vital Signs Assessment: post-procedure vital signs reviewed and stable Respiratory status: spontaneous breathing, nonlabored ventilation and respiratory function stable Cardiovascular status: blood pressure returned to baseline and stable Postop Assessment: Adequate PO intake and No signs of nausea or vomiting Anesthetic complications: no   No notable events documented.  Raliegh Ip

## 2021-06-07 NOTE — Discharge Instructions (Signed)

## 2021-06-07 NOTE — Anesthesia Preprocedure Evaluation (Signed)
Anesthesia Evaluation  Patient identified by MRN, date of birth, ID band Patient awake    Reviewed: Allergy & Precautions, H&P , NPO status , Patient's Chart, lab work & pertinent test results  Airway Mallampati: II  TM Distance: >3 FB Neck ROM: full    Dental no notable dental hx. (+) Upper Dentures, Implants   Pulmonary    Pulmonary exam normal breath sounds clear to auscultation       Cardiovascular hypertension, Normal cardiovascular exam Rhythm:regular Rate:Normal     Neuro/Psych    GI/Hepatic   Endo/Other    Renal/GU      Musculoskeletal   Abdominal   Peds  Hematology   Anesthesia Other Findings   Reproductive/Obstetrics                             Anesthesia Physical  Anesthesia Plan  ASA: 2  Anesthesia Plan: MAC   Post-op Pain Management:    Induction:   PONV Risk Score and Plan: 2 and Treatment may vary due to age or medical condition, TIVA and Midazolam  Airway Management Planned:   Additional Equipment:   Intra-op Plan:   Post-operative Plan:   Informed Consent: I have reviewed the patients History and Physical, chart, labs and discussed the procedure including the risks, benefits and alternatives for the proposed anesthesia with the patient or authorized representative who has indicated his/her understanding and acceptance.     Dental Advisory Given  Plan Discussed with: CRNA  Anesthesia Plan Comments:         Anesthesia Quick Evaluation  

## 2021-06-07 NOTE — Op Note (Signed)
OPERATIVE NOTE  Mary Mosley [id number] 06/07/2021   PREOPERATIVE DIAGNOSIS:  Nuclear sclerotic cataract right eye.  H25.11   POSTOPERATIVE DIAGNOSIS:    Nuclear sclerotic cataract right eye.     PROCEDURE:  Phacoemusification with posterior chamber intraocular lens placement of the right eye   LENS:   Implant Name Type Inv. Item Serial No. Manufacturer Lot No. LRB No. Used Action  LENS IOL TECNIS EYHANCE 19.5 - [id number] Intraocular Lens LENS IOL TECNIS EYHANCE 19.5 [id number] JOHNSON   Right 1 Implanted       Procedure(s) with comments: CATARACT EXTRACTION PHACO AND INTRAOCULAR LENS PLACEMENT (IOC) RIGHT (Right) - 8.45 00:50.7  SURGEON:  Willey Blade, MD, MPH  ANESTHESIOLOGIST: Anesthesiologist: Ranee Gosselin, MD CRNA: Maree Krabbe, CRNA   ANESTHESIA:  Topical with tetracaine drops augmented with 1% preservative-free intracameral lidocaine.  ESTIMATED BLOOD LOSS: less than 1 mL.   COMPLICATIONS:  None.   DESCRIPTION OF PROCEDURE:  The patient was identified in the holding room and transported to the operating room and placed in the supine position under the operating microscope.  The right eye was identified as the operative eye and it was prepped and draped in the usual sterile ophthalmic fashion.   A 1.0 millimeter clear-corneal paracentesis was made at the 10:30 position. 0.5 ml of preservative-free 1% lidocaine with epinephrine was injected into the anterior chamber.  The anterior chamber was filled with Healon 5 viscoelastic.  A 2.4 millimeter keratome was used to make a near-clear corneal incision at the 8:00 position.  A curvilinear capsulorrhexis was made with a cystotome and capsulorrhexis forceps.  Balanced salt solution was used to hydrodissect and hydrodelineate the nucleus.   Phacoemulsification was then used in stop and chop fashion to remove the lens nucleus and epinucleus.  The remaining cortex was then removed using the irrigation and aspiration handpiece.  Healon was then placed into the capsular bag to distend it for lens placement.  A lens was then injected into the capsular bag.  The remaining viscoelastic was aspirated.   Wounds were hydrated with balanced salt solution.  The anterior chamber was inflated to a physiologic pressure with balanced salt solution.   Intracameral vigamox 0.1 mL undiluted was injected into the eye and a drop placed onto the ocular surface.  No wound leaks were noted.  The patient was taken to the recovery room in stable condition without complications of anesthesia or surgery  Willey Blade 06/07/2021, 12:29 PM

## 2021-06-08 ENCOUNTER — Encounter: Payer: Self-pay | Admitting: Ophthalmology

## 2021-06-08 DIAGNOSIS — H2511 Age-related nuclear cataract, right eye: Secondary | ICD-10-CM | POA: Diagnosis not present

## 2021-06-18 NOTE — Discharge Instructions (Signed)

## 2021-06-21 ENCOUNTER — Encounter: Payer: Self-pay | Admitting: Ophthalmology

## 2021-06-21 ENCOUNTER — Ambulatory Visit: Payer: Medicare Other | Admitting: Anesthesiology

## 2021-06-21 ENCOUNTER — Other Ambulatory Visit: Payer: Self-pay

## 2021-06-21 ENCOUNTER — Encounter
Admission: RE | Disposition: A | Discharge status: Home / Self Care | Payer: Self-pay | Source: Home / Self Care | Attending: Ophthalmology

## 2021-06-21 ENCOUNTER — Ambulatory Visit
Admission: RE | Admit: 2021-06-21 | Discharge: 2021-06-21 | Disposition: A | Discharge status: Home / Self Care | Payer: Medicare Other | Attending: Ophthalmology | Admitting: Ophthalmology

## 2021-06-21 DIAGNOSIS — Z79899 Other long term (current) drug therapy: Secondary | ICD-10-CM | POA: Diagnosis not present

## 2021-06-21 DIAGNOSIS — H25812 Combined forms of age-related cataract, left eye: Secondary | ICD-10-CM | POA: Diagnosis not present

## 2021-06-21 DIAGNOSIS — Z9841 Cataract extraction status, right eye: Secondary | ICD-10-CM | POA: Diagnosis not present

## 2021-06-21 DIAGNOSIS — Z596 Low income: Secondary | ICD-10-CM | POA: Diagnosis not present

## 2021-06-21 DIAGNOSIS — H2512 Age-related nuclear cataract, left eye: Secondary | ICD-10-CM | POA: Insufficient documentation

## 2021-06-21 DIAGNOSIS — Z961 Presence of intraocular lens: Secondary | ICD-10-CM | POA: Insufficient documentation

## 2021-06-21 DIAGNOSIS — Z800 Family history of malignant neoplasm of digestive organs: Secondary | ICD-10-CM | POA: Diagnosis not present

## 2021-06-21 DIAGNOSIS — Z8249 Family history of ischemic heart disease and other diseases of the circulatory system: Secondary | ICD-10-CM | POA: Insufficient documentation

## 2021-06-21 HISTORY — PX: CATARACT EXTRACTION W/PHACO: SHX586

## 2021-06-21 SURGERY — PHACOEMULSIFICATION, CATARACT, WITH IOL INSERTION
Anesthesia: Monitor Anesthesia Care | Site: Eye | Laterality: Left

## 2021-06-21 MED ORDER — LIDOCAINE HCL (PF) 2 % IJ SOLN
INTRAOCULAR | Status: DC | PRN
  Administered 2021-06-21: 1 mL via INTRAOCULAR

## 2021-06-21 MED ORDER — LACTATED RINGERS IV SOLN: INTRAVENOUS | Status: DC

## 2021-06-21 MED ORDER — CYCLOPENTOLATE HCL 2 % OP SOLN
1.0000 [drp] | OPHTHALMIC | Status: DC | PRN
  Administered 2021-06-21 (×3): 1 [drp] via OPHTHALMIC

## 2021-06-21 MED ORDER — SIGHTPATH DOSE#1 SODIUM HYALURONATE 23 MG/ML IO SOLUTION
PREFILLED_SYRINGE | INTRAOCULAR | Status: DC | PRN
  Administered 2021-06-21: .6 mL via INTRAOCULAR

## 2021-06-21 MED ORDER — PROMETHAZINE HCL 25 MG/ML IJ SOLN: 6.2500 mg | INTRAMUSCULAR | Status: DC | PRN

## 2021-06-21 MED ORDER — SIGHTPATH DOSE#1 BSS IO SOLN
INTRAOCULAR | Status: DC | PRN
  Administered 2021-06-21: 101 mL via OPHTHALMIC

## 2021-06-21 MED ORDER — MEPERIDINE HCL 25 MG/ML IJ SOLN: 6.2500 mg | INTRAMUSCULAR | Status: DC | PRN

## 2021-06-21 MED ORDER — SIGHTPATH DOSE#1 SODIUM HYALURONATE 10 MG/ML IO SOLUTION
PREFILLED_SYRINGE | INTRAOCULAR | Status: DC | PRN
  Administered 2021-06-21: 0.55 mL via INTRAOCULAR

## 2021-06-21 MED ORDER — OXYCODONE HCL 5 MG PO TABS: 5.0000 mg | ORAL_TABLET | Freq: Once | ORAL | Status: DC | PRN

## 2021-06-21 MED ORDER — OXYCODONE HCL 5 MG/5ML PO SOLN: 5.0000 mg | Freq: Once | ORAL | Status: DC | PRN

## 2021-06-21 MED ORDER — PHENYLEPHRINE HCL 10 % OP SOLN
1.0000 [drp] | OPHTHALMIC | Status: DC | PRN
Start: 2021-06-21 — End: 2021-06-21
  Administered 2021-06-21 (×3): 1 [drp] via OPHTHALMIC

## 2021-06-21 MED ORDER — MOXIFLOXACIN HCL 0.5 % OP SOLN
OPHTHALMIC | Status: DC | PRN
  Administered 2021-06-21: 0.2 mL via OPHTHALMIC

## 2021-06-21 MED ORDER — FENTANYL CITRATE PF 50 MCG/ML IJ SOSY: 25.0000 ug | PREFILLED_SYRINGE | INTRAMUSCULAR | Status: DC | PRN

## 2021-06-21 MED ORDER — TETRACAINE HCL 0.5 % OP SOLN
1.0000 [drp] | OPHTHALMIC | Status: DC | PRN
  Administered 2021-06-21 (×3): 1 [drp] via OPHTHALMIC

## 2021-06-21 MED ORDER — MIDAZOLAM HCL 2 MG/2ML IJ SOLN
INTRAMUSCULAR | Status: DC | PRN
  Administered 2021-06-21: 1 mg via INTRAVENOUS

## 2021-06-21 MED ORDER — SIGHTPATH DOSE#1 BSS IO SOLN
INTRAOCULAR | Status: DC | PRN
  Administered 2021-06-21: 15 mL

## 2021-06-21 MED ORDER — FENTANYL CITRATE (PF) 100 MCG/2ML IJ SOLN
INTRAMUSCULAR | Status: DC | PRN
  Administered 2021-06-21: 50 ug via INTRAVENOUS

## 2021-06-21 SURGICAL SUPPLY — 14 items
CANNULA ANT/CHMB 27GA (MISCELLANEOUS) ×4 IMPLANT
DISSECTOR HYDRO NUCLEUS 50X22 (MISCELLANEOUS) ×2 IMPLANT
GLOVE SURG ENC TEXT LTX SZ7.5 (GLOVE) ×2 IMPLANT
GLOVE SURG SYN 8.5  E (GLOVE) ×2
GLOVE SURG SYN 8.5 E (GLOVE) ×1 IMPLANT
GOWN STRL REUS W/ TWL LRG LVL3 (GOWN DISPOSABLE) ×2 IMPLANT
GOWN STRL REUS W/TWL LRG LVL3 (GOWN DISPOSABLE) ×4
LENS IOL TECNIS EYHANCE 20.5 (Intraocular Lens) ×2 IMPLANT
MARKER SKIN DUAL TIP RULER LAB (MISCELLANEOUS) ×2 IMPLANT
PACK EYE AFTER SURG (MISCELLANEOUS) ×2 IMPLANT
SYR 3ML LL SCALE MARK (SYRINGE) ×2 IMPLANT
SYR TB 1ML LUER SLIP (SYRINGE) ×2 IMPLANT
WATER STERILE IRR 250ML POUR (IV SOLUTION) ×2 IMPLANT
WIPE NON LINTING 3.25X3.25 (MISCELLANEOUS) ×2 IMPLANT

## 2021-06-21 NOTE — Op Note (Signed)
OPERATIVE NOTE  Mary Mosley [id number] 06/21/2021   PREOPERATIVE DIAGNOSIS:  Nuclear sclerotic cataract left eye.  H25.12   POSTOPERATIVE DIAGNOSIS:    Nuclear sclerotic cataract left eye.     PROCEDURE:  Phacoemusification with posterior chamber intraocular lens placement of the left eye   LENS:   Implant Name Type Inv. Item Serial No. Manufacturer Lot No. LRB No. Used Action  LENS IOL TECNIS EYHANCE 20.5 - [id number] Intraocular Lens LENS IOL TECNIS EYHANCE 20.5 [id number] JOHNSON   Left 1 Implanted      Procedure(s): CATARACT EXTRACTION PHACO AND INTRAOCULAR LENS PLACEMENT (IOC) LEFT 7.54 00:51.0 (Left)  DIB00 +20.5   SURGEON:  Willey Blade, MD, MPH   ANESTHESIA:  Topical with tetracaine drops augmented with 1% preservative-free intracameral lidocaine.  ESTIMATED BLOOD LOSS: <1 mL   COMPLICATIONS:  None.   DESCRIPTION OF PROCEDURE:  The patient was identified in the holding room and transported to the operating room and placed in the supine position under the operating microscope.  The left eye was identified as the operative eye and it was prepped and draped in the usual sterile ophthalmic fashion.   A 1.0 millimeter clear-corneal paracentesis was made at the 5:00 position. 0.5 ml of preservative-free 1% lidocaine with epinephrine was injected into the anterior chamber.  The anterior chamber was filled with Healon 5 viscoelastic.  A 2.4 millimeter keratome was used to make a near-clear corneal incision at the 2:00 position.  A curvilinear capsulorrhexis was made with a cystotome and capsulorrhexis forceps.  Balanced salt solution was used to hydrodissect and hydrodelineate the nucleus.   Phacoemulsification was then used in stop and chop fashion to remove the lens nucleus and epinucleus.  The remaining cortex was then removed using the irrigation and aspiration handpiece. Healon was then placed into the capsular bag to distend it for lens placement.  A lens was then injected  into the capsular bag.  The remaining viscoelastic was aspirated.   Wounds were hydrated with balanced salt solution.  The anterior chamber was inflated to a physiologic pressure with balanced salt solution.  Intracameral vigamox 0.1 mL undiltued was injected into the eye and a drop placed onto the ocular surface.  No wound leaks were noted.  The patient was taken to the recovery room in stable condition without complications of anesthesia or surgery  Willey Blade 06/21/2021, 8:55 AM

## 2021-06-21 NOTE — Anesthesia Procedure Notes (Signed)
Procedure Name: MAC Date/Time: 06/21/2021 8:37 AM Performed by: Izetta Dakin, CRNA Pre-anesthesia Checklist: Patient identified, Emergency Drugs available, Suction available, Timeout performed and Patient being monitored Patient Re-evaluated:Patient Re-evaluated prior to induction Oxygen Delivery Method: Nasal cannula Placement Confirmation: positive ETCO2

## 2021-06-21 NOTE — Transfer of Care (Signed)
Immediate Anesthesia Transfer of Care Note  Patient: Mary Mosley  Procedure(s) Performed: CATARACT EXTRACTION PHACO AND INTRAOCULAR LENS PLACEMENT (IOC) LEFT 7.54 00:51.0 (Left: Eye)  Patient Location: PACU  Anesthesia Type: MAC  Level of Consciousness: awake, alert  and patient cooperative  Airway and Oxygen Therapy: Patient Spontanous Breathing and Patient connected to supplemental oxygen  Post-op Assessment: Post-op Vital signs reviewed, Patient's Cardiovascular Status Stable, Respiratory Function Stable, Patent Airway and No signs of Nausea or vomiting  Post-op Vital Signs: Reviewed and stable  Complications: No notable events documented.

## 2021-06-21 NOTE — H&P (Signed)
[facility]   Primary Care Physician:  Malva Limes, MD Ophthalmologist: Dr. Willey Blade  Pre-Procedure History & Physical: HPI:  Mary Mosley is a 80 y.o. female here for cataract surgery.   Past Medical History:  Diagnosis Date   Hypertension    OSA (obstructive sleep apnea)    pt denies   Osteopenia     Past Surgical History:  Procedure Laterality Date   CATARACT EXTRACTION W/PHACO Right 06/07/2021   Procedure: CATARACT EXTRACTION PHACO AND INTRAOCULAR LENS PLACEMENT (IOC) RIGHT;  Surgeon: Nevada Crane, MD;  Location: [facility];  Service: Ophthalmology;  Laterality: Right;  8.45 00:50.7   EYE SURGERY Bilateral    due to trauma   TONSILLECTOMY AND ADENOIDECTOMY      Prior to Admission medications   Medication Sig Start Date End Date Taking? Authorizing Provider  amLODipine (NORVASC) 5 MG tablet Take 1 tablet (5 mg total) by mouth daily. 09/11/20  Yes Malva Limes, MD  cholecalciferol (VITAMIN D) 1000 UNITS tablet Take 2 tablets by mouth daily.   Yes [provider]  LORazepam (ATIVAN) 1 MG tablet Take 1 tablet (1 mg total) by mouth at bedtime as needed. 02/09/21  Yes Malva Limes, MD    Allergies as of 04/13/2021 - Review Complete 02/09/2021  Allergen Reaction Noted   Fosamax [alendronate sodium] Itching 01/21/2019   Penicillins  09/03/2015    Family History  Problem Relation Age of Onset   Heart attack Mother    Lung cancer Brother    Heart Problems Brother    Heart Problems Brother        Has stents   Liver cancer Son    Breast cancer Neg Hx     Social History   Socioeconomic History   Marital status: Widowed    Spouse name: Not on file   Number of children: 2   Years of education: Not on file   Highest education level: Some college, no degree  Occupational History   Occupation: Retired  Tobacco Use   Smoking status: Never   Smokeless tobacco: Never  Vaping Use   Vaping Use: Never used  Substance and  Sexual Activity   Alcohol use: No    Alcohol/week: 0.0 standard drinks   Drug use: No   Sexual activity: Not on file  Other Topics Concern   Not on file  Social History Narrative   Not on file   Social Determinants of Health   Financial Resource Strain: Low Risk    Difficulty of Paying Living Expenses: Not hard at all  Food Insecurity: No Food Insecurity   Worried About Programme researcher, broadcasting/film/video in the Last Year: Never true   Ran Out of Food in the Last Year: Never true  Transportation Needs: No Transportation Needs   Lack of Transportation (Medical): No   Lack of Transportation (Non-Medical): No  Physical Activity: Inactive   Days of Exercise per Week: 0 days   Minutes of Exercise per Session: 0 min  Stress: No Stress Concern Present   Feeling of Stress : Not at all  Social Connections: Moderately Integrated   Frequency of Communication with Friends and Family: More than three times a week   Frequency of Social Gatherings with Friends and Family: More than three times a week   Attends Religious Services: More than 4 times per year   Active Member of [employer] or Organizations: Yes   Attends Banker Meetings: More than 4 times  per year   Marital Status: Widowed  Catering manager Violence: Not At Risk   Fear of Current or Ex-Partner: No   Emotionally Abused: No   Physically Abused: No   Sexually Abused: No    Review of Systems: See HPI, otherwise negative ROS  Physical Exam: BP (!) 164/75   Pulse 71   Temp (!) 97 F (36.1 C) (Temporal)   Ht 5\' 8"  (1.727 m)   Wt 56.9 kg   SpO2 100%   BMI 19.07 kg/m  General:   Alert, cooperative in NAD Head:  Normocephalic and atraumatic. Respiratory:  Normal work of breathing. Cardiovascular:  RRR  Impression/Plan: Mary Mosley is here for cataract surgery.  Risks, benefits, limitations, and alternatives regarding cataract surgery have been reviewed with the patient.  Questions have been answered.  All parties  agreeable.  Patient was seen and evaluated prior to surgery, 8:20 am.   BMK.

## 2021-06-21 NOTE — Anesthesia Postprocedure Evaluation (Addendum)
Anesthesia Post Note  Patient: Mary Mosley  Procedure(s) Performed: CATARACT EXTRACTION PHACO AND INTRAOCULAR LENS PLACEMENT (IOC) LEFT 7.54 00:51.0 (Left: Eye)     Patient location during evaluation: PACU Anesthesia Type: MAC Level of consciousness: awake and alert Pain management: pain level controlled Vital Signs Assessment: post-procedure vital signs reviewed and stable Respiratory status: spontaneous breathing, nonlabored ventilation, respiratory function stable and patient connected to nasal cannula oxygen Cardiovascular status: blood pressure returned to baseline and stable Postop Assessment: no apparent nausea or vomiting Anesthetic complications: no   No notable events documented.  Atlantis Delong, Glade Stanford

## 2021-06-21 NOTE — Anesthesia Preprocedure Evaluation (Signed)
Anesthesia Evaluation  Patient identified by MRN, date of birth, ID band Patient awake    Reviewed: Allergy & Precautions, H&P , NPO status , Patient's Chart, lab work & pertinent test results  Airway Mallampati: II  TM Distance: >3 FB Neck ROM: full    Dental no notable dental hx. (+) Upper Dentures, Implants   Pulmonary    Pulmonary exam normal breath sounds clear to auscultation       Cardiovascular hypertension, Normal cardiovascular exam Rhythm:regular Rate:Normal     Neuro/Psych    GI/Hepatic   Endo/Other    Renal/GU      Musculoskeletal   Abdominal   Peds  Hematology   Anesthesia Other Findings   Reproductive/Obstetrics                             Anesthesia Physical  Anesthesia Plan  ASA: 2  Anesthesia Plan: MAC   Post-op Pain Management:    Induction:   PONV Risk Score and Plan: 2 and Treatment may vary due to age or medical condition, TIVA and Midazolam  Airway Management Planned:   Additional Equipment:   Intra-op Plan:   Post-operative Plan:   Informed Consent: I have reviewed the patients History and Physical, chart, labs and discussed the procedure including the risks, benefits and alternatives for the proposed anesthesia with the patient or authorized representative who has indicated his/her understanding and acceptance.     Dental Advisory Given  Plan Discussed with: CRNA  Anesthesia Plan Comments:         Anesthesia Quick Evaluation

## 2021-06-22 ENCOUNTER — Encounter: Payer: Self-pay | Admitting: Ophthalmology

## 2021-07-07 ENCOUNTER — Other Ambulatory Visit: Payer: Self-pay

## 2021-07-07 ENCOUNTER — Ambulatory Visit
Admission: RE | Admit: 2021-07-07 | Discharge: 2021-07-07 | Disposition: A | Discharge status: Home / Self Care | Payer: Medicare Other | Source: Ambulatory Visit | Attending: Family Medicine | Admitting: Family Medicine

## 2021-07-07 DIAGNOSIS — Z1231 Encounter for screening mammogram for malignant neoplasm of breast: Secondary | ICD-10-CM | POA: Insufficient documentation

## 2021-08-17 ENCOUNTER — Ambulatory Visit: Payer: Self-pay | Admitting: Family Medicine

## 2021-08-20 ENCOUNTER — Ambulatory Visit (INDEPENDENT_AMBULATORY_CARE_PROVIDER_SITE_OTHER): Payer: Medicare Other | Admitting: Family Medicine

## 2021-08-20 ENCOUNTER — Other Ambulatory Visit: Payer: Self-pay

## 2021-08-20 ENCOUNTER — Encounter: Payer: Self-pay | Admitting: Family Medicine

## 2021-08-20 VITALS — BP 148/74 | HR 72 | Temp 98.1°F | Resp 16 | Wt 129.0 lb

## 2021-08-20 DIAGNOSIS — Z23 Encounter for immunization: Secondary | ICD-10-CM | POA: Diagnosis not present

## 2021-08-20 DIAGNOSIS — G4700 Insomnia, unspecified: Secondary | ICD-10-CM

## 2021-08-20 DIAGNOSIS — I10 Essential (primary) hypertension: Secondary | ICD-10-CM

## 2021-08-20 MED ORDER — LORAZEPAM 1 MG PO TABS: 1.0000 mg | ORAL_TABLET | Freq: Every evening | ORAL | 3 refills | Status: DC | PRN

## 2021-08-20 NOTE — Patient Instructions (Signed)
Please review the attached list of medications and notify my office if there are any errors.   Please go to the lab draw station in [address] on the second floor of [facility]  when you are fasting for 8 hours. Normal hours are 8:00am to 11:30am and 1:00pm to 4:00pm Monday through Friday     

## 2021-08-20 NOTE — Progress Notes (Signed)
1     Established patient visit   Patient: Mary Mosley   DOB: 1941/05/13   80 y.o. Female  MRN: [id number] Visit Date: 08/20/2021  Today's healthcare provider: Mila Merry, MD   Chief Complaint  Patient presents with   Hypertension   Subjective    HPI  Hypertension, follow-up  BP Readings from Last 3 Encounters:  08/20/21 (!) 148/74  06/21/21 126/68  06/07/21 (!) 146/75   Wt Readings from Last 3 Encounters:  08/20/21 129 lb (58.5 kg)  06/21/21 125 lb 6.4 oz (56.9 kg)  06/07/21 128 lb (58.1 kg)     She was last seen for hypertension 6 months ago.  BP at that visit was 136/58. Management since that visit includes continuing same medication.  She reports good compliance with treatment. She is not having side effects.  She is following a Regular diet. She is exercising. She does not smoke.  Use of agents associated with hypertension: none.   Outside blood pressures are 120-130/ 60. Symptoms: No chest pain No chest pressure  No palpitations No syncope  No dyspnea No orthopnea  No paroxysmal nocturnal dyspnea No lower extremity edema   Pertinent labs: Lab Results  Component Value Date   CHOL 216 (H) 10/16/2019   HDL 78 10/16/2019   LDLCALC 124 (H) 10/16/2019   TRIG 80 10/16/2019   CHOLHDL 2.8 10/16/2019   Lab Results  Component Value Date   NA 139 09/11/2020   K 4.5 09/11/2020   CREATININE 0.85 09/11/2020   GFRNONAA 64 09/11/2020   GLUCOSE 103 (H) 09/11/2020     The ASCVD Risk score (Arnett DK, et al., 2019) failed to calculate for the following reasons:   The 2019 ASCVD risk score is only valid for ages 28 to 56   ---------------------------------------------------------------------------------------------------   Follow up for insomnia:  The patient was last seen for this 6 months ago. Changes made at last visit include none; continue occasional Lorazepam at bedtime as needed.  She reports good compliance with treatment. She feels that  condition is Unchanged. She is not having side effects.   -----------------------------------------------------------------------------------------     Medications: Outpatient Medications Prior to Visit  Medication Sig   amLODipine (NORVASC) 5 MG tablet Take 1 tablet (5 mg total) by mouth daily.   cholecalciferol (VITAMIN D) 1000 UNITS tablet Take 2 tablets by mouth daily.   LORazepam (ATIVAN) 1 MG tablet Take 1 tablet (1 mg total) by mouth at bedtime as needed.   No facility-administered medications prior to visit.    Review of Systems  Constitutional:  Negative for appetite change, chills, fatigue and fever.  Respiratory:  Negative for chest tightness and shortness of breath.   Cardiovascular:  Negative for chest pain and palpitations.  Gastrointestinal:  Negative for abdominal pain, nausea and vomiting.  Neurological:  Negative for dizziness and weakness.      Objective    BP (!) 148/74 (BP Location: Left Arm, Patient Position: Sitting, Cuff Size: Normal)   Pulse 72   Temp 98.1 F (36.7 C) (Oral)   Resp 16   Wt 129 lb (58.5 kg)   SpO2 100% Comment: room air  BMI 19.61 kg/m  {Show previous vital signs (optional):23777}  Physical Exam   General: Appearance:    Thin female in no acute distress  Eyes:    PERRL, conjunctiva/corneas clear, EOM's intact       Lungs:     Clear to auscultation bilaterally, respirations unlabored  Heart:    Normal  heart rate. Normal rhythm. No murmurs, rubs, or gallops.    MS:   All extremities are intact.    Neurologic:   Awake, alert, oriented x 3. No apparent focal neurological defect.          Assessment & Plan     1. Primary hypertension Home Bps much better than in office readings, mostly in the 120s to 130s. Continue current medications.   - CBC - Comprehensive metabolic panel - Lipid panel  2. Insomnia, unspecified type Doing well with occasional- LORazepam (ATIVAN) 1 MG tablet; Take 1 tablet (1 mg total) by mouth at  bedtime as needed.  Dispense: 30 tablet; Refill: 3  3. Need for influenza vaccination  - Flu Vaccine QUAD High Dose IM (Fluad)         The entirety of the information documented in the History of Present Illness, Review of Systems and Physical Exam were personally obtained by me. Portions of this information were initially documented by the CMA and reviewed by me for thoroughness and accuracy.     Mila Merry, MD  [facility] [phone] (phone) [phone] (fax)  [facility]

## 2021-08-23 DIAGNOSIS — I10 Essential (primary) hypertension: Secondary | ICD-10-CM | POA: Diagnosis not present

## 2021-08-24 LAB — LIPID PANEL
Chol/HDL Ratio: 2.6 ratio (ref 0.0–4.4)
Cholesterol, Total: 204 mg/dL — ABNORMAL HIGH (ref 100–199)
HDL: 78 mg/dL (ref >39)
LDL Chol Calc (NIH): 114 mg/dL — ABNORMAL HIGH (ref 0–99)
Triglycerides: 65 mg/dL (ref 0–149)
VLDL Cholesterol Cal: 12 mg/dL (ref 5–40)

## 2021-08-24 LAB — COMPREHENSIVE METABOLIC PANEL
ALT: 9 IU/L (ref 0–32)
AST: 18 IU/L (ref 0–40)
Albumin/Globulin Ratio: 2.2 (ref 1.2–2.2)
Albumin: 5.2 g/dL — ABNORMAL HIGH (ref 3.6–4.6)
Alkaline Phosphatase: 68 IU/L (ref 44–121)
BUN/Creatinine Ratio: 11 — ABNORMAL LOW (ref 12–28)
BUN: 11 mg/dL (ref 8–27)
Bilirubin Total: 0.3 mg/dL (ref 0.0–1.2)
CO2: 26 mmol/L (ref 20–29)
Calcium: 10.6 mg/dL — ABNORMAL HIGH (ref 8.7–10.3)
Chloride: 104 mmol/L (ref 96–106)
Creatinine, Ser: 0.96 mg/dL (ref 0.57–1.00)
Globulin, Total: 2.4 g/dL (ref 1.5–4.5)
Glucose: 97 mg/dL (ref 70–99)
Potassium: 5.1 mmol/L (ref 3.5–5.2)
Sodium: 144 mmol/L (ref 134–144)
Total Protein: 7.6 g/dL (ref 6.0–8.5)
eGFR: 59 mL/min/{1.73_m2} — ABNORMAL LOW (ref >59)

## 2021-08-24 LAB — CBC
Hematocrit: 43.6 % (ref 34.0–46.6)
Hemoglobin: 14.3 g/dL (ref 11.1–15.9)
MCH: 31 pg (ref 26.6–33.0)
MCHC: 32.8 g/dL (ref 31.5–35.7)
MCV: 95 fL (ref 79–97)
Platelets: 195 10*3/uL (ref 150–450)
RBC: 4.61 x10E6/uL (ref 3.77–5.28)
RDW: 12.9 % (ref 11.7–15.4)
WBC: 4.2 10*3/uL (ref 3.4–10.8)

## 2021-09-08 ENCOUNTER — Other Ambulatory Visit: Payer: Self-pay | Admitting: Family Medicine

## 2021-09-08 DIAGNOSIS — R030 Elevated blood-pressure reading, without diagnosis of hypertension: Secondary | ICD-10-CM

## 2021-09-08 MED ORDER — AMLODIPINE BESYLATE 5 MG PO TABS
5.0000 mg | ORAL_TABLET | Freq: Every day | ORAL | 1 refills | Status: DC
Start: 2021-09-08 — End: 2022-02-07

## 2021-09-08 NOTE — Telephone Encounter (Signed)
Requested Prescriptions  Pending Prescriptions Disp Refills  . amLODipine (NORVASC) 5 MG tablet 90 tablet 1    Sig: Take 1 tablet (5 mg total) by mouth daily.     Cardiovascular:  Calcium Channel Blockers Failed - 09/08/2021 10:03 AM      Failed - Last BP in normal range    BP Readings from Last 1 Encounters:  08/20/21 (!) 148/74         Passed - Valid encounter within last 6 months    Recent Outpatient Visits          2 weeks ago Primary hypertension   [facility] Malva Limes, MD   7 months ago Primary hypertension   [facility] Malva Limes, MD   11 months ago Primary hypertension   [facility] Malva Limes, MD   12 months ago Elevated blood pressure reading   [facility] Malva Limes, MD   1 year ago Encounter for special screening examination for cardiovascular disorder   [facility] Malva Limes, MD

## 2021-09-08 NOTE — Telephone Encounter (Signed)
Copied from CRM [id number]. Topic: Quick Communication - Rx Refill/Question >> Sep 08, 2021  9:05 AM Jaquita Rector A wrote: Medication: amLODipine (NORVASC) 5 MG tablet   Has the patient contacted their pharmacy? No. Bottle say no refills  (Agent: If no, request that the patient contact the pharmacy for the refill.) (Agent: If yes, when and what did the pharmacy advise?)  Preferred Pharmacy (with phone number or street name): [facility] [place], [place] - [address]  Phone:  [phone] Fax:  [phone]    Has the patient been seen for an appointment in the last year OR does the patient have an upcoming appointment? Yes.    Agent: Please be advised that RX refills may take up to 3 business days. We ask that you follow-up with your pharmacy.

## 2021-12-28 ENCOUNTER — Ambulatory Visit (INDEPENDENT_AMBULATORY_CARE_PROVIDER_SITE_OTHER): Payer: Medicare Other

## 2021-12-28 DIAGNOSIS — Z0000 Encounter for general adult medical examination without abnormal findings: Secondary | ICD-10-CM | POA: Diagnosis not present

## 2021-12-28 NOTE — Progress Notes (Signed)
Virtual Visit via Telephone Note  I connected with  Mary JanJoyce L Avera on 12/28/21 at  9:00 AM EST by telephone and verified that I am speaking with the correct person using two identifiers.  Location: Patient: home Provider: [facility] Persons participating in the virtual visit: patient/Nurse Health Advisor   I discussed the limitations, risks, security and privacy concerns of performing an evaluation and management service by telephone and the availability of in person appointments. The patient expressed understanding and agreed to proceed.  Interactive audio and video telecommunications were attempted between this nurse and patient, however failed, due to patient having technical difficulties OR patient did not have access to video capability.  We continued and completed visit with audio only.  Some vital signs may be absent or patient reported.   Hal HopeLorrie S Shinita Mac, LPN  Subjective:   Mary Mosley is a 81 y.o. female who presents for Medicare Annual (Subsequent) preventive examination.  Review of Systems           Objective:    There were no vitals filed for this visit. There is no height or weight on file to calculate BMI.  Advanced Directives 06/21/2021 06/07/2021 12/22/2020 07/30/2019 07/24/2018 07/21/2017  Does Patient Have a Medical Advance Directive? Yes Yes Yes Yes Yes Yes  Type of Estate agentAdvance Directive Healthcare Power of [place]Attorney;Living will Living will;Healthcare Power of State Street Corporationttorney Healthcare Power of [place]orney;Living will Healthcare Power of [place];Living will Healthcare Power of [place];Living will Healthcare Power of [place]orney;Living will  Does patient want to make changes to medical advance directive? No - Patient declined No - Patient declined - - - -  Copy of Healthcare Power of Attorney in Chart? No - copy requested No - copy requested Yes - validated most recent copy scanned in chart (See row information) No - copy requested No - copy requested No - copy requested    Current  Medications (verified) Outpatient Encounter Medications as of 12/28/2021  Medication Sig   amLODipine (NORVASC) 5 MG tablet Take 1 tablet (5 mg total) by mouth daily.   cholecalciferol (VITAMIN D) 1000 UNITS tablet Take 2 tablets by mouth daily.   LORazepam (ATIVAN) 1 MG tablet Take 1 tablet (1 mg total) by mouth at bedtime as needed.   No facility-administered encounter medications on file as of 12/28/2021.    Allergies (verified) Fosamax [alendronate sodium] and Penicillins   History: Past Medical History:  Diagnosis Date   Hypertension    OSA (obstructive sleep apnea)    pt denies   Osteopenia    Past Surgical History:  Procedure Laterality Date   CATARACT EXTRACTION W/PHACO Right 06/07/2021   Procedure: CATARACT EXTRACTION PHACO AND INTRAOCULAR LENS PLACEMENT (IOC) RIGHT;  Surgeon: Nevada CraneKing, Bradley Mark, MD;  [facility];  Service: Ophthalmology;  Laterality: Right;  8.45 00:50.7   CATARACT EXTRACTION W/PHACO Left 06/21/2021   Procedure: CATARACT EXTRACTION PHACO AND INTRAOCULAR LENS PLACEMENT (IOC) LEFT 7.54 00:51.0;  Surgeon: Nevada CraneKing, Bradley Mark, MD;  [facility];  Service: Ophthalmology;  Laterality: Left;   EYE SURGERY Bilateral    due to trauma   TONSILLECTOMY AND ADENOIDECTOMY     Family History  Problem Relation Age of Onset   Heart attack Mother    Lung cancer Brother    Heart Problems Brother    Heart Problems Brother        Has stents   Liver cancer Son    Breast cancer Neg Hx    Social History   Socioeconomic History  Marital status: Widowed    Spouse name: Not on file   Number of children: 2   Years of education: Not on file   Highest education level: Some college, no degree  Occupational History   Occupation: Retired  Tobacco Use   Smoking status: Never   Smokeless tobacco: Never  Vaping Use   Vaping Use: Never used  Substance and Sexual Activity   Alcohol use: No    Alcohol/week: 0.0 standard drinks   Drug use: No    Sexual activity: Not on file  Other Topics Concern   Not on file  Social History Narrative   Not on file   Social Determinants of Health   Financial Resource Strain: Low Risk    Difficulty of Paying Living Expenses: Not hard at all  Food Insecurity: No Food Insecurity   Worried About Programme researcher, broadcasting/film/video in the Last Year: Never true   Ran Out of Food in the Last Year: Never true  Transportation Needs: No Transportation Needs   Lack of Transportation (Medical): No   Lack of Transportation (Non-Medical): No  Physical Activity: Inactive   Days of Exercise per Week: 0 days   Minutes of Exercise per Session: 0 min  Stress: No Stress Concern Present   Feeling of Stress : Not at all  Social Connections: Moderately Integrated   Frequency of Communication with Friends and Family: More than three times a week   Frequency of Social Gatherings with Friends and Family: More than three times a week   Attends Religious Services: More than 4 times per year   Active Member of [employer] or Organizations: Yes   Attends Banker Meetings: More than 4 times per year   Marital Status: Widowed    Tobacco Counseling Counseling given: Not Answered   Clinical Intake:  Pre-visit preparation completed: Yes  Pain : No/denies pain     Nutritional Risks: None Diabetes: No  How often do you need to have someone help you when you read instructions, pamphlets, or other written materials from your doctor or pharmacy?: 1 - Never  Diabetic?no  Interpreter Needed?: No  Information entered by :: Kennedy Bucker, LPN   Activities of Daily Living In your present state of health, do you have any difficulty performing the following activities: 06/21/2021 06/07/2021  Hearing? N N  Vision? N N  Difficulty concentrating or making decisions? N N  Walking or climbing stairs? N N  Dressing or bathing? N N  Doing errands, shopping? - -  Some recent data might be hidden    Patient Care  Team: Malva Limes, MD as PCP - General (Family Medicine) Nevada Crane, MD as Consulting Physician (Ophthalmology)  Indicate any recent Medical Services you may have received from other than [facility] providers in the past year (date may be approximate).     Assessment:   This is a routine wellness examination for Mary Mosley.  Hearing/Vision screen No results found.  Dietary issues and exercise activities discussed:     Goals Addressed   None    Depression Screen PHQ 2/9 Scores 02/09/2021 12/22/2020 09/11/2020 07/30/2019 07/24/2018 07/21/2017 09/15/2016  PHQ - 2 Score 0 0 0 0 0 0 0  PHQ- 9 Score 0 - 1 - - - 0    Fall Risk Fall Risk  02/09/2021 12/22/2020 09/11/2020 10/15/2019 07/30/2019  Falls in the past year? 0 0 0 0 0  Number falls in past yr: 0 0 0 0 -  Injury with Fall?  0 0 0 0 -  Follow up Falls evaluation completed - - Falls evaluation completed -    FALL RISK PREVENTION PERTAINING TO THE HOME:  Any stairs in or around the home? Yes  If so, are there any without handrails? No  Home free of loose throw rugs in walkways, pet beds, electrical cords, etc? Yes  Adequate lighting in your home to reduce risk of falls? Yes   ASSISTIVE DEVICES UTILIZED TO PREVENT FALLS:  Life alert? No  Use of a cane, walker or w/c? No  Grab bars in the bathroom? No  Shower chair or bench in shower? No  Elevated toilet seat or a handicapped toilet? Yes     Cognitive Function: MMSE - Mini Mental State Exam 09/11/2020  Orientation to time 5  Orientation to Place 5  Registration 3  Attention/ Calculation 5  Recall 3  Language- name 2 objects 2  Language- repeat 1  Language- follow 3 step command 3  Language- read & follow direction 1  Write a sentence 1  Copy design 1  Total score 30     6CIT Screen 07/30/2019 07/21/2017  What Year? 0 points 0 points  What month? 0 points 0 points  What time? 0 points 0 points  Count back from 20 0 points 0 points  Months in reverse 0 points 0 points   Repeat phrase 0 points 4 points  Total Score 0 4    Immunizations Immunization History  Administered Date(s) Administered   Fluad Quad(high Dose 65+) 09/11/2020, 08/20/2021   Influenza, High Dose Seasonal PF 09/07/2015, 09/15/2016, 07/21/2017, 07/24/2018, 09/16/2019   PFIZER(Purple Top)SARS-COV-2 Vaccination 09/22/2020, 10/13/2020   Pneumococcal Conjugate-13 09/04/2014   Pneumococcal Polysaccharide-23 01/08/2004    TDAP status: Due, Education has been provided regarding the importance of this vaccine. Advised may receive this vaccine at local pharmacy or Health Dept. Aware to provide a copy of the vaccination record if obtained from local pharmacy or Health Dept. Verbalized acceptance and understanding.  Flu Vaccine status: Up to date  Pneumococcal vaccine status: Up to date  Covid-19 vaccine status: Completed vaccines  Qualifies for Shingles Vaccine? No   Zostavax completed No   Shingrix Completed?: No.    Education has been provided regarding the importance of this vaccine. Patient has been advised to call insurance company to determine out of pocket expense if they have not yet received this vaccine. Advised may also receive vaccine at local pharmacy or Health Dept. Verbalized acceptance and understanding.  Screening Tests Health Maintenance  Topic Date Due   Zoster Vaccines- Shingrix (1 of 2) Never done   COVID-19 Vaccine (3 - Booster for Pfizer series) 12/08/2020   DEXA SCAN  10/29/2021   TETANUS/TDAP  11/21/2026 (Originally 01/07/1960)   Pneumonia Vaccine 34+ Years old  Completed   INFLUENZA VACCINE  Completed   HPV VACCINES  Aged Out    Health Maintenance  Health Maintenance Due  Topic Date Due   Zoster Vaccines- Shingrix (1 of 2) Never done   COVID-19 Vaccine (3 - Booster for Pfizer series) 12/08/2020   DEXA SCAN  10/29/2021    Colorectal cancer screening: Type of screening: Colonoscopy. Completed 09/20/13. Repeat every 10 years- aged out  Mammogram status:  Completed 07/07/21. Repeat every year- aged out  Bone Density status: Completed 10/29/18. Results reflect: Bone density results: OSTEOPOROSIS. Repeat every 3 years.- declined referral  Lung Cancer Screening: (Low Dose CT Chest recommended if Age 60-80 years, 30 pack-year currently smoking OR have quit w/in  15years.) does not qualify.    Additional Screening:  Hepatitis C Screening: does not qualify; Completed no  Vision Screening: Recommended annual ophthalmology exams for early detection of glaucoma and other disorders of the eye. Is the patient up to date with their annual eye exam?  Yes  Who is the provider or what is the name of the office in which the patient attends annual eye exams? Dr.King If pt is not established with a provider, would they like to be referred to a provider to establish care? No .   Dental Screening: Recommended annual dental exams for proper oral hygiene  Community Resource Referral / Chronic Care Management: CRR required this visit?  No   CCM required this visit?  No      Plan:     I have personally reviewed and noted the following in the patients chart:   Medical and social history Use of alcohol, tobacco or illicit drugs  Current medications and supplements including opioid prescriptions.  Functional ability and status Nutritional status Physical activity Advanced directives List of other physicians Hospitalizations, surgeries, and ER visits in previous 12 months Vitals Screenings to include cognitive, depression, and falls Referrals and appointments  In addition, I have reviewed and discussed with patient certain preventive protocols, quality metrics, and best practice recommendations. A written personalized care plan for preventive services as well as general preventive health recommendations were provided to patient.     Hal Hope, LPN   05/25/6432   Nurse Notes: none

## 2021-12-28 NOTE — Patient Instructions (Signed)
Mary Mosley , Thank you for taking time to come for your Medicare Wellness Visit. I appreciate your ongoing commitment to your health goals. Please review the following plan we discussed and let me know if I can assist you in the future.   Screening recommendations/referrals: Colonoscopy: 09/20/13 Mammogram: 07/07/21 Bone Density: 10/29/18 Recommended yearly ophthalmology/optometry visit for glaucoma screening and checkup Recommended yearly dental visit for hygiene and checkup  Vaccinations: Influenza vaccine: 08/20/21 Pneumococcal vaccine: 09/04/14 Tdap vaccine: n/d Shingles vaccine: n/d   Covid-19:09/22/20, 10/13/20  Advanced directives: yes  Conditions/risks identified: none  Next appointment: Follow up in one year for your annual wellness visit:  12/29/22 @ 9am by phone   Preventive Care 65 Years and Older, Female Preventive care refers to lifestyle choices and visits with your health care provider that can promote health and wellness. What does preventive care include? A yearly physical exam. This is also called an annual well check. Dental exams once or twice a year. Routine eye exams. Ask your health care provider how often you should have your eyes checked. Personal lifestyle choices, including: Daily care of your teeth and gums. Regular physical activity. Eating a healthy diet. Avoiding tobacco and drug use. Limiting alcohol use. Practicing safe sex. Taking low-dose aspirin every day. Taking vitamin and mineral supplements as recommended by your health care provider. What happens during an annual well check? The services and screenings done by your health care provider during your annual well check will depend on your age, overall health, lifestyle risk factors, and family history of disease. Counseling  Your health care provider may ask you questions about your: Alcohol use. Tobacco use. Drug use. Emotional well-being. Home and relationship well-being. Sexual  activity. Eating habits. History of falls. Memory and ability to understand (cognition). Work and work Astronomer. Reproductive health. Screening  You may have the following tests or measurements: Height, weight, and BMI. Blood pressure. Lipid and cholesterol levels. These may be checked every 5 years, or more frequently if you are over 9 years old. Skin check. Lung cancer screening. You may have this screening every year starting at age 68 if you have a 30-pack-year history of smoking and currently smoke or have quit within the past 15 years. Fecal occult blood test (FOBT) of the stool. You may have this test every year starting at age 56. Flexible sigmoidoscopy or colonoscopy. You may have a sigmoidoscopy every 5 years or a colonoscopy every 10 years starting at age 68. Hepatitis C blood test. Hepatitis B blood test. Sexually transmitted disease (STD) testing. Diabetes screening. This is done by checking your blood sugar (glucose) after you have not eaten for a while (fasting). You may have this done every 1-3 years. Bone density scan. This is done to screen for osteoporosis. You may have this done starting at age 6. Mammogram. This may be done every 1-2 years. Talk to your health care provider about how often you should have regular mammograms. Talk with your health care provider about your test results, treatment options, and if necessary, the need for more tests. Vaccines  Your health care provider may recommend certain vaccines, such as: Influenza vaccine. This is recommended every year. Tetanus, diphtheria, and acellular pertussis (Tdap, Td) vaccine. You may need a Td booster every 10 years. Zoster vaccine. You may need this after age 89. Pneumococcal 13-valent conjugate (PCV13) vaccine. One dose is recommended after age 56. Pneumococcal polysaccharide (PPSV23) vaccine. One dose is recommended after age 54. Talk to your health care provider  about which screenings and vaccines  you need and how often you need them. This information is not intended to replace advice given to you by your health care provider. Make sure you discuss any questions you have with your health care provider. Document Released: 12/04/2015 Document Revised: 07/27/2016 Document Reviewed: 09/08/2015 Elsevier Interactive Patient Education  2017 [place] Prevention in the Home Falls can cause injuries. They can happen to people of all ages. There are many things you can do to make your home safe and to help prevent falls. What can I do on the outside of my home? Regularly fix the edges of walkways and driveways and fix any cracks. Remove anything that might make you trip as you walk through a door, such as a raised step or threshold. Trim any bushes or trees on the path to your home. Use bright outdoor lighting. Clear any walking paths of anything that might make someone trip, such as rocks or tools. Regularly check to see if handrails are loose or broken. Make sure that both sides of any steps have handrails. Any raised decks and porches should have guardrails on the edges. Have any leaves, snow, or ice cleared regularly. Use sand or salt on walking paths during winter. Clean up any spills in your garage right away. This includes oil or grease spills. What can I do in the bathroom? Use night lights. Install grab bars by the toilet and in the tub and shower. Do not use towel bars as grab bars. Use non-skid mats or decals in the tub or shower. If you need to sit down in the shower, use a plastic, non-slip stool. Keep the floor dry. Clean up any water that spills on the floor as soon as it happens. Remove soap buildup in the tub or shower regularly. Attach bath mats securely with double-sided non-slip rug tape. Do not have throw rugs and other things on the floor that can make you trip. What can I do in the bedroom? Use night lights. Make sure that you have a light by your bed that  is easy to reach. Do not use any sheets or blankets that are too big for your bed. They should not hang down onto the floor. Have a firm chair that has side arms. You can use this for support while you get dressed. Do not have throw rugs and other things on the floor that can make you trip. What can I do in the kitchen? Clean up any spills right away. Avoid walking on wet floors. Keep items that you use a lot in easy-to-reach places. If you need to reach something above you, use a strong step stool that has a grab bar. Keep electrical cords out of the way. Do not use floor polish or wax that makes floors slippery. If you must use wax, use non-skid floor wax. Do not have throw rugs and other things on the floor that can make you trip. What can I do with my stairs? Do not leave any items on the stairs. Make sure that there are handrails on both sides of the stairs and use them. Fix handrails that are broken or loose. Make sure that handrails are as long as the stairways. Check any carpeting to make sure that it is firmly attached to the stairs. Fix any carpet that is loose or worn. Avoid having throw rugs at the top or bottom of the stairs. If you do have throw rugs, attach them to the floor  with carpet tape. Make sure that you have a light switch at the top of the stairs and the bottom of the stairs. If you do not have them, ask someone to add them for you. What else can I do to help prevent falls? Wear shoes that: Do not have high heels. Have rubber bottoms. Are comfortable and fit you well. Are closed at the toe. Do not wear sandals. If you use a stepladder: Make sure that it is fully opened. Do not climb a closed stepladder. Make sure that both sides of the stepladder are locked into place. Ask someone to hold it for you, if possible. Clearly mark and make sure that you can see: Any grab bars or handrails. First and last steps. Where the edge of each step is. Use tools that help you  move around (mobility aids) if they are needed. These include: Canes. Walkers. Scooters. Crutches. Turn on the lights when you go into a dark area. Replace any light bulbs as soon as they burn out. Set up your furniture so you have a clear path. Avoid moving your furniture around. If any of your floors are uneven, fix them. If there are any pets around you, be aware of where they are. Review your medicines with your doctor. Some medicines can make you feel dizzy. This can increase your chance of falling. Ask your doctor what other things that you can do to help prevent falls. This information is not intended to replace advice given to you by your health care provider. Make sure you discuss any questions you have with your health care provider. Document Released: 09/03/2009 Document Revised: 04/14/2016 Document Reviewed: 12/12/2014 Elsevier Interactive Patient Education  2017 [employer].

## 2022-01-06 ENCOUNTER — Ambulatory Visit: Payer: Self-pay | Admitting: *Deleted

## 2022-01-06 NOTE — Telephone Encounter (Signed)
° ° °  Chief Complaint: Itching Symptoms: Itching late afternoon after taking Amlodipine in AM Frequency: Onset 3 weeks ago Pertinent Negatives: Patient denies rash, fever Disposition: [] ED /[] Urgent Care (no appt availability in office) / [] Appointment(In office/virtual)/ []  [place]/ [] Home Care/ [] Refused Recommended Disposition /[] [place] [facility]/ [x]  Follow-up with PCP Additional Notes: Pt states started Amlodipine late Oct. "Have eliminated all else and I think the itching is from this." Did not take yesterday or today and no itching today. Also, BP 120/66 and 127/68 without med. Questioning if she should try alternate med or if needed at all. Please advise.  Reason for Disposition  Hives or itching  Answer Assessment - Initial Assessment Questions 1. APPEARANCE of RASH: "Describe the rash." (e.g., spots, blisters, raised areas, skin peeling, scaly)     No rash, just itching 2. SIZE: "How big are the spots?" (e.g., tip of pen, eraser, coin; inches, centimeters)      3. LOCATION: "Where is the rash located?"      4. COLOR: "What color is the rash?" (Note: It is difficult to assess rash color in people with darker-colored skin. When this situation occurs, simply ask the caller to describe what they see.)      5. ONSET: "When did the rash begin?"     3 weeks ago 6. FEVER: "Do you have a fever?" If Yes, ask: "What is your temperature, how was it measured, and when did it start?"     no 7. ITCHING: "Does the rash itch?" If Yes, ask: "How bad is the itch?" (Scale 1-10; or mild, moderate, severe)     varies 8. CAUSE: "What do you think is causing the rash?"     med 9. NEW MEDICATION: "What new medication are you taking?" (e.g., name of antibiotic) "When did you start taking this medication?".     "May be from amlodipine."  Protocols used: Rash - Widespread On Drugs-A-AH

## 2022-01-07 NOTE — Telephone Encounter (Signed)
Patient advised and verbalized understanding. Follow up appointment scheduled.  

## 2022-01-07 NOTE — Telephone Encounter (Signed)
Can stop the medication for now. Schedule a follow up in about 4 weeks to check on BP

## 2022-02-07 ENCOUNTER — Encounter: Payer: Self-pay | Admitting: Family Medicine

## 2022-02-07 ENCOUNTER — Ambulatory Visit (INDEPENDENT_AMBULATORY_CARE_PROVIDER_SITE_OTHER): Payer: Medicare Other | Admitting: Family Medicine

## 2022-02-07 ENCOUNTER — Other Ambulatory Visit: Payer: Self-pay

## 2022-02-07 VITALS — BP 151/84 | HR 85 | Temp 97.6°F | Resp 14 | Wt 126.0 lb

## 2022-02-07 DIAGNOSIS — G4700 Insomnia, unspecified: Secondary | ICD-10-CM | POA: Diagnosis not present

## 2022-02-07 DIAGNOSIS — M4125 Other idiopathic scoliosis, thoracolumbar region: Secondary | ICD-10-CM

## 2022-02-07 DIAGNOSIS — M5030 Other cervical disc degeneration, unspecified cervical region: Secondary | ICD-10-CM | POA: Diagnosis not present

## 2022-02-07 DIAGNOSIS — M419 Scoliosis, unspecified: Secondary | ICD-10-CM | POA: Insufficient documentation

## 2022-02-07 DIAGNOSIS — G8929 Other chronic pain: Secondary | ICD-10-CM | POA: Diagnosis not present

## 2022-02-07 DIAGNOSIS — M545 Low back pain, unspecified: Secondary | ICD-10-CM | POA: Diagnosis not present

## 2022-02-07 DIAGNOSIS — M5135 Other intervertebral disc degeneration, thoracolumbar region: Secondary | ICD-10-CM

## 2022-02-07 DIAGNOSIS — M5134 Other intervertebral disc degeneration, thoracic region: Secondary | ICD-10-CM | POA: Insufficient documentation

## 2022-02-07 DIAGNOSIS — I10 Essential (primary) hypertension: Secondary | ICD-10-CM | POA: Diagnosis not present

## 2022-02-07 MED ORDER — LORAZEPAM 1 MG PO TABS: 0.5000 mg | ORAL_TABLET | Freq: Every evening | ORAL | Status: DC | PRN

## 2022-02-07 MED ORDER — TRAZODONE HCL 50 MG PO TABS
50.0000 mg | ORAL_TABLET | Freq: Every day | ORAL | 1 refills | Status: DC
Start: 2022-02-07 — End: 2022-05-19

## 2022-02-07 MED ORDER — HYDROCHLOROTHIAZIDE 12.5 MG PO TABS: 12.5000 mg | ORAL_TABLET | Freq: Every day | ORAL | 3 refills | Status: DC

## 2022-02-07 NOTE — Progress Notes (Signed)
?  ? ? ?I,Roshena L Chambers,acting as a scribe for Lelon Huh, MD.,have documented all relevant documentation on the behalf of Lelon Huh, MD,as directed by  Lelon Huh, MD while in the presence of Lelon Huh, MD.  ? ? ?Established patient visit ? ? ?Patient: Mary Mosley   DOB: 08/18/1941   81 y.o. Female  MRN: [id number] ?Visit Date: 02/07/2022 ? ?Today's healthcare provider: Lelon Huh, MD  ? ?Chief Complaint  ?Patient presents with  ? Hypertension  ? ?Subjective  ?  ?HPI  ?Hypertension, follow-up ? ?BP Readings from Last 3 Encounters:  ?02/07/22 (!) 151/84  ?08/20/21 (!) 148/74  ?06/21/21 126/68  ? Wt Readings from Last 3 Encounters:  ?02/07/22 126 lb (57.2 kg)  ?08/20/21 129 lb (58.5 kg)  ?06/21/21 125 lb 6.4 oz (56.9 kg)  ?  ? ?She was last seen for hypertension 5 months ago.  ?BP at that visit was 148/74. Management since that visit includes stopping amlodipine 4 weeks ago due to itching. ? ?She reports good compliance with treatment. ?She is not having side effects.  ?She is following a Regular diet. ?She is not exercising. ?She does not smoke. ? ?Use of agents associated with hypertension: none.  ? ?Outside blood pressures are 125-130/ 50's. ?Symptoms: ?No chest pain No chest pressure  ?No palpitations No syncope  ?No dyspnea No orthopnea  ?No paroxysmal nocturnal dyspnea No lower extremity edema  ? ?Pertinent labs: ?Lab Results  ?Component Value Date  ? CHOL 204 (H) 08/23/2021  ? HDL 78 08/23/2021  ? LDLCALC 114 (H) 08/23/2021  ? TRIG 65 08/23/2021  ? CHOLHDL 2.6 08/23/2021  ? Lab Results  ?Component Value Date  ? NA 144 08/23/2021  ? K 5.1 08/23/2021  ? CREATININE 0.96 08/23/2021  ? EGFR 59 (L) 08/23/2021  ? GLUCOSE 97 08/23/2021  ? TSH 0.687 09/11/2020  ?  ? ?The ASCVD Risk score (Arnett DK, et al., 2019) failed to calculate for the following reasons: ?  The 2019 ASCVD risk score is only valid for ages 7 to 67   ? ?---------------------------------------------------------------------------------------------------  ?She also states lorazepam is not longer working well for sleep and would like to change medications.  ? ?She also reports long history extensive DDD of spine previously seen by Dr. Annette Stable many years ago and was told there was no surgical procedure she would qualify for and recommended pain management. She had been tolerating pain, but now she wishes to proceed with pain management referral.  ? ?Medications: ?Outpatient Medications Prior to Visit  ?Medication Sig  ? cholecalciferol (VITAMIN D) 1000 UNITS tablet Take 2 tablets by mouth daily.  ? LORazepam (ATIVAN) 1 MG tablet Take 1 tablet (1 mg total) by mouth at bedtime as needed.  ? amLODipine (NORVASC) 5 MG tablet Take 1 tablet (5 mg total) by mouth daily. (Patient not taking: Reported on 02/07/2022)  ? ?No facility-administered medications prior to visit.  ? ? ?Review of Systems  ?Constitutional:  Negative for appetite change, chills, fatigue and fever.  ?Respiratory:  Negative for chest tightness and shortness of breath.   ?Cardiovascular:  Negative for chest pain and palpitations.  ?Gastrointestinal:  Negative for abdominal pain, nausea and vomiting.  ?Neurological:  Negative for dizziness and weakness.  ? ? ?  Objective  ?  ?BP (!) 151/84 (BP Location: Right Arm, Patient Position: Sitting, Cuff Size: Normal)   Pulse 85   Temp 97.6 ?F (36.4 ?C) (Oral)   Resp 14   Wt 126 lb (57.2  kg)   SpO2 100% Comment: room air  BMI 19.16 kg/m?  ? ? ?Today's Vitals  ? 02/07/22 0942 02/07/22 0948  ?BP: (!) 148/65 (!) 151/84  ?Pulse: 85   ?Resp: 14   ?Temp: 97.6 ?F (36.4 ?C)   ?TempSrc: Oral   ?SpO2: 100%   ?Weight: 126 lb (57.2 kg)   ? ?Body mass index is 19.16 kg/m?.  ? ?Physical Exam  ? ?General appearance: Underweight female, cooperative and in no acute distress ?Head: Normocephalic, without obvious abnormality, atraumatic ?Respiratory: Respirations even and  unlabored, normal respiratory rate ?Extremities: All extremities are intact.  ?Skin: Skin color, texture, turgor normal. No rashes seen  ?Psych: Appropriate mood and affect. ?Neurologic: Mental status: Alert, oriented to person, place, and time, thought content appropriate.  ? ? Assessment & Plan  ? ?1. Primary hypertension ?Itching resolved since stopping amlodipine last month. Will change to  hydrochlorothiazide (HYDRODIURIL) 12.5 MG tablet; Take 1 tablet (12.5 mg total) by mouth daily.  Dispense: 30 tablet; Refill: 3 ? ?Recheck BP in 4 weeks.  ? ?2. Insomnia, unspecified type ?Lorazepam no longer effective. Will try  traZODone (DESYREL) 50 MG tablet; Take 1-2 tablets (50-100 mg total) by mouth at bedtime.  Dispense: 30 tablet; Refill: 1 ? ?Wean  LORazepam (ATIVAN) 1 MG tablet; to  0.5 tablets (0.5 mg total) by mouth at bedtime as needed.  ? ?Future Appointments  ?Date Time Provider [place]  ?03/09/2022  9:40 AM Birdie Sons, MD [facility]  ?12/29/2022  9:00 AM [facility]-NURSE HEALTH ADVISOR [facility]  ?  ?3. Chronic bilateral low back pain, unspecified whether sciatica present ?Long history with extensive abnormalities on remote MRI in 2002. Previously followed by Dr. Trenton Gammon who recommended referral to pain management. She has been able to tolerate chronic pain for many years, but states it is now impairing her ADLs and would like to have evaluation at pain management clinic.  ? ?- Ambulatory referral to Pain Clinic ? ?4. DDD (degenerative disc disease), cervical ? ? ?5. DDD (degenerative disc disease), thoracolumbar ? ? ?6. Other idiopathic scoliosis, thoracolumbar region ?  ?   ? ?The entirety of the information documented in the History of Present Illness, Review of Systems and Physical Exam were personally obtained by me. Portions of this information were initially documented by the CMA and reviewed by me for thoroughness and accuracy.   ? ? ?Lelon Huh, MD  ?[facility] ?[phone]  (phone) ?[phone] (fax) ? ?[facility]  ?

## 2022-03-09 ENCOUNTER — Encounter: Payer: Self-pay | Admitting: Family Medicine

## 2022-03-09 ENCOUNTER — Ambulatory Visit (INDEPENDENT_AMBULATORY_CARE_PROVIDER_SITE_OTHER): Payer: Medicare Other | Admitting: Family Medicine

## 2022-03-09 VITALS — BP 134/86 | HR 97 | Temp 98.0°F | Resp 16 | Wt 126.1 lb

## 2022-03-09 DIAGNOSIS — M5135 Other intervertebral disc degeneration, thoracolumbar region: Secondary | ICD-10-CM | POA: Diagnosis not present

## 2022-03-09 DIAGNOSIS — M4125 Other idiopathic scoliosis, thoracolumbar region: Secondary | ICD-10-CM

## 2022-03-09 DIAGNOSIS — G8929 Other chronic pain: Secondary | ICD-10-CM

## 2022-03-09 DIAGNOSIS — I10 Essential (primary) hypertension: Secondary | ICD-10-CM

## 2022-03-09 DIAGNOSIS — M545 Low back pain, unspecified: Secondary | ICD-10-CM

## 2022-03-09 NOTE — Progress Notes (Signed)
?  ?I,Jana Robinson,acting as a scribe for Lelon Huh, MD.,have documented all relevant documentation on the behalf of Lelon Huh, MD,as directed by  Lelon Huh, MD while in the presence of Lelon Huh, MD. ? ? ? ?Established patient visit ? ? ?Patient: Mary Mosley   DOB: May 23, 1941   81 y.o. Female  MRN: [id number] ?Visit Date: 03/09/2022 ? ?Today's healthcare provider: Lelon Huh, MD  ? ?Chief Complaint  ?Patient presents with  ? Hypertension  ? ?Subjective  ?  ?HPI  ?Hypertension, follow-up ? ?BP Readings from Last 3 Encounters:  ?03/09/22 134/86  ?02/07/22 (!) 151/84  ?08/20/21 (!) 148/74  ? Wt Readings from Last 3 Encounters:  ?03/09/22 126 lb 1.6 oz (57.2 kg)  ?02/07/22 126 lb (57.2 kg)  ?08/20/21 129 lb (58.5 kg)  ?  ? ?She was last seen for hypertension 1 months ago.  ?BP at that visit was 151 84. Management since that visit includes change to hydrochlorothiazide  12.5 MG tablet; Take 1 tablet (12.5 mg total) by mouth daily. ? ?She reports excellent compliance with treatment. ?She is not having side effects.  ?She is following a Regular diet. ?She is exercising. ?She does not smoke. ? ?Use of agents associated with hypertension: none.  ? ?Outside blood pressures are average 130-60  ?Symptoms: ?No chest pain No chest pressure  ?No palpitations No syncope  ?No dyspnea No orthopnea  ?No paroxysmal nocturnal dyspnea No lower extremity edema  ? ?Pertinent labs ?Lab Results  ?Component Value Date  ? CHOL 204 (H) 08/23/2021  ? HDL 78 08/23/2021  ? LDLCALC 114 (H) 08/23/2021  ? TRIG 65 08/23/2021  ? CHOLHDL 2.6 08/23/2021  ? Lab Results  ?Component Value Date  ? NA 144 08/23/2021  ? K 5.1 08/23/2021  ? CREATININE 0.96 08/23/2021  ? EGFR 59 (L) 08/23/2021  ? GLUCOSE 97 08/23/2021  ? TSH 0.687 09/11/2020  ?  ? ?The ASCVD Risk score (Arnett DK, et al., 2019) failed to calculate for the following reasons: ?  The 2019 ASCVD risk score is only valid for ages 22 to  61 ? ?---------------------------------------------------------------------------------------------------  ? ?Medications: ?Outpatient Medications Prior to Visit  ?Medication Sig  ? cholecalciferol (VITAMIN D) 1000 UNITS tablet Take 2 tablets by mouth daily.  ? hydrochlorothiazide (HYDRODIURIL) 12.5 MG tablet Take 1 tablet (12.5 mg total) by mouth daily.  ? LORazepam (ATIVAN) 1 MG tablet Take 0.5 tablets (0.5 mg total) by mouth at bedtime as needed.  ? traZODone (DESYREL) 50 MG tablet Take 1-2 tablets (50-100 mg total) by mouth at bedtime.  ? ?No facility-administered medications prior to visit.  ? ? ? ? ? ?  Objective  ?  ?BP 134/86 (BP Location: Left Arm, Patient Position: Sitting, Cuff Size: Normal)   Pulse 97   Temp 98 ?F (36.7 ?C) (Oral)   Resp 16   Wt 126 lb 1.6 oz (57.2 kg)   SpO2 100%   BMI 19.17 kg/m?  ? ? ?Physical Exam  ?General appearance: Thin female, cooperative and in no acute distress ?Head: Normocephalic, without obvious abnormality, atraumatic ?Respiratory: Respirations even and unlabored, normal respiratory rate ?Extremities: All extremities are intact.  ?Skin: Skin color, texture, turgor normal. No rashes seen  ?Psych: Appropriate mood and affect. ?Neurologic: Mental status: Alert, oriented to person, place, and time, thought content appropriate.  ? ? ? Assessment & Plan  ?  ? ?1. Primary hypertension ?Much better with addition of hctz which she is tolerating well with no adverse effects.  Follow up 6 months.  ? ?2. DDD (degenerative disc disease), thoracolumbar ? ? ?3. Other idiopathic scoliosis, thoracolumbar region ? ? ?4. Chronic bilateral low back pain, unspecified whether sciatica present ?Referral to pain management was placed at last visit in mid March and she has still not been contacted to make appointment. Will check on referral.   ? ? ?   ? ?The entirety of the information documented in the History of Present Illness, Review of Systems and Physical Exam were personally obtained  by me. Portions of this information were initially documented by the CMA and reviewed by me for thoroughness and accuracy.   ? ? ?Lelon Huh, MD  ?[facility] ?[phone] (phone) ?[phone] (fax) ? ?[facility] ?

## 2022-03-09 NOTE — Patient Instructions (Signed)
.   Please review the attached list of medications and notify my office if there are any errors.   . Please bring all of your medications to every appointment so we can make sure that our medication list is the same as yours.   

## 2022-04-05 ENCOUNTER — Ambulatory Visit: Payer: Medicare Other | Admitting: Student in an Organized Health Care Education/Training Program

## 2022-04-05 ENCOUNTER — Other Ambulatory Visit: Payer: Self-pay | Admitting: Student in an Organized Health Care Education/Training Program

## 2022-04-05 ENCOUNTER — Other Ambulatory Visit: Payer: Self-pay

## 2022-04-05 ENCOUNTER — Encounter: Payer: Self-pay | Admitting: Student in an Organized Health Care Education/Training Program

## 2022-04-05 ENCOUNTER — Ambulatory Visit
Admission: RE | Admit: 2022-04-05 | Discharge: 2022-04-05 | Disposition: A | Discharge status: Home / Self Care | Payer: Medicare Other | Source: Ambulatory Visit | Attending: Student in an Organized Health Care Education/Training Program | Admitting: Student in an Organized Health Care Education/Training Program

## 2022-04-05 VITALS — BP 176/97 | HR 94 | Temp 98.2°F | Resp 16 | Ht 66.0 in | Wt 126.0 lb

## 2022-04-05 DIAGNOSIS — G8929 Other chronic pain: Secondary | ICD-10-CM

## 2022-04-05 DIAGNOSIS — M47816 Spondylosis without myelopathy or radiculopathy, lumbar region: Secondary | ICD-10-CM

## 2022-04-05 DIAGNOSIS — M5136 Other intervertebral disc degeneration, lumbar region: Secondary | ICD-10-CM

## 2022-04-05 DIAGNOSIS — M2578 Osteophyte, vertebrae: Secondary | ICD-10-CM | POA: Diagnosis not present

## 2022-04-05 DIAGNOSIS — M546 Pain in thoracic spine: Secondary | ICD-10-CM

## 2022-04-05 DIAGNOSIS — G894 Chronic pain syndrome: Secondary | ICD-10-CM | POA: Insufficient documentation

## 2022-04-05 DIAGNOSIS — M51369 Other intervertebral disc degeneration, lumbar region without mention of lumbar back pain or lower extremity pain: Secondary | ICD-10-CM

## 2022-04-05 DIAGNOSIS — M419 Scoliosis, unspecified: Secondary | ICD-10-CM | POA: Diagnosis not present

## 2022-04-05 DIAGNOSIS — M48062 Spinal stenosis, lumbar region with neurogenic claudication: Secondary | ICD-10-CM

## 2022-04-05 DIAGNOSIS — M5134 Other intervertebral disc degeneration, thoracic region: Secondary | ICD-10-CM

## 2022-04-05 MED ORDER — GABAPENTIN 100 MG PO CAPS: ORAL_CAPSULE | ORAL | 0 refills | Status: DC

## 2022-04-05 MED ORDER — DICLOFENAC SODIUM 50 MG PO TBEC: 50.0000 mg | DELAYED_RELEASE_TABLET | Freq: Two times a day (BID) | ORAL | 0 refills | Status: AC

## 2022-04-05 NOTE — Progress Notes (Signed)
Patient: Mary Mosley  Service Category: E/M  Provider: Gillis Santa, MD  ?DOB: 18-Oct-1941  DOS: 04/05/2022  Referring Provider: Birdie Sons, MD  ?MRN: [id number]  Setting: Ambulatory outpatient  PCP: Birdie Sons, MD  ?Type: New Patient  Specialty: Interventional Pain Management    ?Location: Office  Delivery: Face-to-face    ? ?Primary Reason(s) for Visit: Encounter for initial evaluation of one or more chronic problems (new to examiner) potentially causing chronic pain, and posing a threat to normal musculoskeletal function. (Level of risk: High) ?CC: Back Pain (Lumbar bilateral ) ? ?HPI  ?Mary Mosley is a 81 y.o. year old, female patient, who comes for the first time to our practice referred by Birdie Sons, MD for our initial evaluation of her chronic pain. She has Breast calcification, right; Edema; Insomnia; Myalgia; OSA (obstructive sleep apnea); Osteopenia; Primary hypertension; DDD (degenerative disc disease), cervical; Thoracic degenerative disc disease; Scoliosis; Spinal stenosis, lumbar region, with neurogenic claudication; Lumbar degenerative disc disease; Lumbar spondylosis; Chronic bilateral thoracic back pain; and Chronic pain syndrome on their problem list. Today she comes in for evaluation of her Back Pain (Lumbar bilateral ) ? ?Pain Assessment: ?Location: Lower, Left, Right Back ?Radiating: up the spine to approx midway. ?Onset: More than a month ago ?Duration: Chronic pain ?Quality: Constant, Discomfort, Nagging, Other (Comment) (sometimes the pain is severe) ?Severity: 4 /10 (subjective, self-reported pain score)  ?Effect on ADL: continues on with day to day which sometimes makes the evenings worse d/t pain ?Timing: Constant ?Modifying factors: tylenol takes the sharpness out.  heat and chair massager. exercise ?BP: (!) 176/97  HR: 94 ? ?Onset and Duration: Gradual ?Cause of pain: Unknown ?Severity: Getting worse, NAS-11 at its worse: 7/10, NAS-11 at its best: 3/10, NAS-11 now:  3/10, and NAS-11 on the average: 4/10 ?Timing: Not influenced by the time of the day ?Aggravating Factors: Prolonged sitting, Prolonged standing, and Walking uphill ?Alleviating Factors: Stretching, Cold packs, and Hot packs ?Associated Problems: Night-time cramps ?Quality of Pain: Aching, Getting longer, and Throbbing ?Previous Examinations or Tests: Neurological evaluation ?Previous Treatments: The patient denies treatments. ? ?[name] is a pleasant 81 year old female who presents with a chief complaint of mid thoracic and lumbar spine pain that does not radiate frequently to her lower extremity.  This has been going on for many years.  She has a history of thoracic and lumbar degenerative disc disease as well as scoliosis of the lower thoracic and lumbar spine.  She has multilevel lumbar disc herniations as well.  She has not had any imaging in the last 5 years.  She tries to remain fairly active.  She utilizes Tylenol at home.  She also utilizes a heat and her massager.  She is having increased pain and has been referred by her primary care provider to explore other options.  She states that she saw Dr. Trenton Gammon with neurosurgery many years ago who advised against surgery and recommended pain management for her.  Patient denies any history of chronic kidney disease.  Review of her CMP shows normal creatinine.  She takes ibuprofen as needed.  She has not had any injections in the last 5 years.  She does find relief with bending forward.  She states that going from sitting to standing is very painful.  She does have trouble sleeping at night. ? ?Meds  ? ?Current Outpatient Medications:  ?  cholecalciferol (VITAMIN D) 1000 UNITS tablet, Take 2 tablets by mouth daily., Disp: , Rfl:  ?  diclofenac (  VOLTAREN) 50 MG EC tablet, Take 1 tablet (50 mg total) by mouth 2 (two) times daily after a meal for 15 days., Disp: 30 tablet, Rfl: 0 ?  gabapentin (NEURONTIN) 100 MG capsule, Take 1 capsule (100 mg total) by mouth at bedtime  for 10 days, THEN 2 capsules (200 mg total) at bedtime for 10 days, THEN 3 capsules (300 mg total) at bedtime. Follow written titration schedule.., Disp: 120 capsule, Rfl: 0 ?  hydrochlorothiazide (HYDRODIURIL) 12.5 MG tablet, Take 1 tablet (12.5 mg total) by mouth daily., Disp: 30 tablet, Rfl: 3 ?  LORazepam (ATIVAN) 1 MG tablet, Take 0.5 tablets (0.5 mg total) by mouth at bedtime as needed., Disp: , Rfl:  ?  traZODone (DESYREL) 50 MG tablet, Take 1-2 tablets (50-100 mg total) by mouth at bedtime., Disp: 30 tablet, Rfl: 1 ? ?Imaging Review  ?Cervical Imaging: ?Cervical MR wo contrast: Results for orders placed in visit on 04/12/01 ? ?MR Cervical Spine Wo Contrast ? ?Narrative ?FINDINGS ?CLINICAL HISTORY:   LEFT SHOULDER PAIN EXTENDING INTO ARM FOR THE PAST SIX MONTHS. ?CERVICAL SPINE MR WITHOUT CONTRAST: ?NO COMPARISON PLAIN FILMS. ?FINDINGS: ?THE CERVICOMEDULLARY JUNCTION AND VISUALIZED INTRACRANIAL STRUCTURES ARE GROSSLY WITHIN NORMAL ?LIMITS.  THE CERVICAL SPINAL CORD IS OF NORMAL CALIBER WITHOUT FOCAL SIGNAL ABNORMALITY. ?THERE IS HETEROGENEOUS ENLARGEMENT OF THE RIGHT LOBE OF THE THYROID GLAND WHICH MAY REPRESENT A ?GOITER.  ULTRASOUND COULD BE OBTAINED FOR FURTHER DELINEATION. ?C3-4:     MINIMAL BULGE. ?C6-7:     MILD DISK DEGENERATION AND DISK SPACE NARROWING WITH SMALL BROAD BASED DISK PROTRUSION ?WITH PROTRUDING DISK MATERIAL MOST NOTABLE IN A LEFT LATERAL POSITION CAUSING IMPRESSION UPON THE ?LEFT VENTRAL ASPECT OF THE THECAL SAC AND SLIGHT NARROWING ALONG THE ORIGIN OF THE LEFT C6-7 NEURAL ?FORAMEN POSSIBLY CAUSING MASS EFFECT UPON THE LEFT C7 VENTRAL NERVE ROOT. ?OTHERWISE NEGATIVE EXAM. ?IMPRESSION: ?1.   C6-7   BROAD BASED DISK PROTRUSION WITH GREATER EXTENSION IN A LEFT POSTEROLATERAL/LATERAL ?POSITION AS DISCUSSED ABOVE. ?2.   C3-4   MINIMAL BULGE. ?3.    HETEROGENEOUS ENLARGEMENT OF RIGHT LOBE OF THE THYROID GLAND.  ULTRASOUND COULD BE OBTAINED ?FOR FURTHER DELINEATION. ?CLINICAL HISTORY:    BACK  PAIN EXTENDING INTO TOES BILATERALLY. ?LUMBAR SPINE MRI: ?NO COMPARISON PLAIN FILMS.  FOR THE PRESENT EXAMINATION I WILL ASSUME THERE ARE FIVE NON-RIB- ?BEARING LUMBAR SPINE VERTEBRAE.  PRIOR TO ANY INTERVENTION, CORRELATION WITH PLAIN FILMS TO CONFIRM ?THE LEVEL ASSIGNMENT IS RECOMMENDED. ?FINDINGS: ?THERE IS A SCOLIOSIS OF THE LOWER THORACIC AND LUMBAR SPINE.  THE CONUS IS LOCATED AT THE T12-L1 ?DISK SPACE LEVEL. ?T11-12:     NEGATIVE. ?T12-L1:     MILD DISK DEGENERATION AND BULGE. ?L1-2:         MODERATE DISK DEGENERATION AND BULGE.  ROTATION. ?L2-3:         MODERATE DISK DEGENERATION, DISK SPACE NARROWING, RIGHT LATERAL OSTEOPHYTE, ROTATION, ?RETROLISTHESIS, BROAD BASED BULGE/PROTRUSION, FACET JOINT BONY OVERGROWTH WITH SUBSEQUENT MILD ?SPINAL STENOSIS.  RIGHT LATERAL OSTEOPHYTE ENCROACHES UPON THE COURSE OF THE EXITING RIGHT L2 NERVE ?ROOT WHICH DOES NOT APPEAR SIGNIFICANTLY COMPRESSED. ?L3-4:         MILD DISK DEGENERATION AND BULGE. ?L4-5:         MILD DISK DEGENERATION WITH A SMALL BROAD BASED DISK PROTRUSION WITH SLIGHTLY GREATER ?EXTENSION IN A LEFT POSTEROLATERAL POSITION WITH MILD LEFT-SIDED SUBARTICULAR LATERAL RECESS ?STENOSIS WITH MINIMAL MASS EFFECT UPON THE ORIGIN OF THE LEFT L5 NERVE ROOT. ?L5-S1:      MODERATE DISK DEGENERATION AND  DISK SPACE NARROWING WITH SMALL OSTEOPHYTE FORMATION AND ?MINIMAL BULGE.  BILATERAL FACET JOINT DEGENERATIVE CHANGES.  NO SIGNIFICANT SPINAL STENOSIS OR ?NEURAL FORAMINAL NARROWING. ?RIGHT RENAL  PARAPELVIC CYST.  THIS IS INCOMPLETELY EVALUATED ON THE PRESENT EXAM. ?IMPRESSION ? ? ? ?Narrative ?FINDINGS ?CLINICAL DATA:  NECK PAIN, LEFT ARM PAIN. ?CERVICAL SPINE: ?THERE IS VERY MILD OSTEOPHYTE FORMATION AND DISK SPACE NARROWING AT C6-7. FLEXION/EXTENSION SHOWS ?NO ABNORMAL MOVEMENT. THERE IS NO SOFT TISSUE SWELLING PRESENT. GOOD RANGE OF MOTION IS SEEN. ?IMPRESSION ?VERY MILD DEGENERATIVE CHANGE AT C6-7. ?MRI OF THE CERVICAL SPINE: ?MULTIPLANAR MULTIECHO PULSE SEQUENCES  WERE OBTAINED WITHOUT CONTRAST. THERE IS MILD DISK ?DESICCATION AT C6-7. THERE IS A SMALL CENTRAL PROTRUSION AT C6-7 AS WELL WITHOUT NERVE ROOT CUTOFF ?OR SPINAL STENOSIS.  THE REMAINING LEVELS APPEAR Duffy Bruce

## 2022-04-05 NOTE — Progress Notes (Signed)
Safety precautions to be maintained throughout the outpatient stay will include: orient to surroundings, keep bed in low position, maintain call bell within reach at all times, provide assistance with transfer out of bed and ambulation.  

## 2022-04-13 ENCOUNTER — Emergency Department
Admission: EM | Admit: 2022-04-13 | Discharge: 2022-04-13 | Disposition: A | Discharge status: Home / Self Care | Payer: Medicare Other | Attending: Emergency Medicine | Admitting: Emergency Medicine

## 2022-04-13 ENCOUNTER — Emergency Department: Payer: Medicare Other

## 2022-04-13 ENCOUNTER — Encounter: Payer: Self-pay | Admitting: Medical Oncology

## 2022-04-13 DIAGNOSIS — Z23 Encounter for immunization: Secondary | ICD-10-CM | POA: Insufficient documentation

## 2022-04-13 DIAGNOSIS — S0990XA Unspecified injury of head, initial encounter: Secondary | ICD-10-CM | POA: Diagnosis not present

## 2022-04-13 DIAGNOSIS — W01198A Fall on same level from slipping, tripping and stumbling with subsequent striking against other object, initial encounter: Secondary | ICD-10-CM | POA: Insufficient documentation

## 2022-04-13 DIAGNOSIS — S0181XA Laceration without foreign body of other part of head, initial encounter: Secondary | ICD-10-CM | POA: Insufficient documentation

## 2022-04-13 DIAGNOSIS — E042 Nontoxic multinodular goiter: Secondary | ICD-10-CM | POA: Diagnosis not present

## 2022-04-13 DIAGNOSIS — S199XXA Unspecified injury of neck, initial encounter: Secondary | ICD-10-CM | POA: Diagnosis not present

## 2022-04-13 DIAGNOSIS — M7981 Nontraumatic hematoma of soft tissue: Secondary | ICD-10-CM | POA: Diagnosis not present

## 2022-04-13 DIAGNOSIS — I10 Essential (primary) hypertension: Secondary | ICD-10-CM | POA: Insufficient documentation

## 2022-04-13 DIAGNOSIS — S0003XA Contusion of scalp, initial encounter: Secondary | ICD-10-CM | POA: Diagnosis not present

## 2022-04-13 DIAGNOSIS — M2578 Osteophyte, vertebrae: Secondary | ICD-10-CM | POA: Diagnosis not present

## 2022-04-13 DIAGNOSIS — S0511XA Contusion of eyeball and orbital tissues, right eye, initial encounter: Secondary | ICD-10-CM | POA: Diagnosis not present

## 2022-04-13 MED ORDER — TETANUS-DIPHTH-ACELL PERTUSSIS 5-2.5-18.5 LF-MCG/0.5 IM SUSY
0.5000 mL | PREFILLED_SYRINGE | Freq: Once | INTRAMUSCULAR | Status: AC
  Administered 2022-04-13: 0.5 mL via INTRAMUSCULAR
  Filled 2022-04-13: qty 0.5

## 2022-04-13 NOTE — ED Provider Notes (Signed)
[facility] Provider Note  Patient Contact: 7:36 PM (approximate)   History   Head Injury   HPI  Mary Mosley is a 81 y.o. female presents to the emergency department with a large right-sided periorbital hematoma with small overlying abrasion after patient fell forward while attempting to use a leaf blower.  Patient did not lose consciousness and does not currently take a blood thinner.  She denies neck pain.  No numbness or tingling in the upper and lower extremities.  No chest pain, chest tightness or abdominal pain.  Patient lives with her son and daughter-in-law.      Physical Exam   Triage Vital Signs: ED Triage Vitals  Enc Vitals Group     BP 04/13/22 1729 (!) 197/89     Pulse Rate 04/13/22 1729 91     Resp 04/13/22 1729 16     Temp 04/13/22 1729 98.4 F (36.9 C)     Temp Source 04/13/22 1729 Oral     SpO2 04/13/22 1729 99 %     Weight 04/13/22 1730 123 lb (55.8 kg)     Height 04/13/22 1730 5\' 6"  (1.676 m)     Head Circumference --      Peak Flow --      Pain Score 04/13/22 1730 0     Pain Loc --      Pain Edu? --      Excl. in GC? --     Most recent vital signs: Vitals:   04/13/22 1926 04/13/22 1927  BP: (!) 159/85   Pulse: 75   Resp:  17  Temp:    SpO2: 96%      General: Alert and in no acute distress. Eyes:  PERRL. EOMI Head: No acute traumatic findings.  Patient has large right-sided periorbital hematoma with overlying abrasion. ENT:      Ears: No hemotympanum bilaterally.      Nose: No congestion/rhinnorhea.      Mouth/Throat: Mucous membranes are moist. Neck: No stridor. No cervical spine tenderness to palpation. Cardiovascular:  Good peripheral perfusion Respiratory: Normal respiratory effort without tachypnea or retractions. Lungs CTAB. Good air entry to the bases with no decreased or absent breath sounds. Gastrointestinal: Bowel sounds 4 quadrants. Soft and nontender to palpation. No guarding or rigidity. No  palpable masses. No distention. No CVA tenderness. Musculoskeletal: Full range of motion to all extremities.  Neurologic:  No gross focal neurologic deficits are appreciated.  Skin:   No rash noted Other:   ED Results / Procedures / Treatments   Labs (all labs ordered are listed, but only abnormal results are displayed) Labs Reviewed - No data to display      RADIOLOGY  I personally viewed and evaluated these images as part of my medical decision making, as well as reviewing the written report by the radiologist.  ED Provider Interpretation: I personally interpreted CTs of the head, face and cervical spine and agree with radiologist interpretation.  No acute facial fracture.  No skull fracture or evidence of intracranial bleed.  No fractures of the cervical spine.   PROCEDURES:  Critical Care performed: No  ..Laceration Repair  Date/Time: 04/13/2022 7:39 PM Performed by: 04/15/2022, PA-C Authorized by: Orvil Feil, PA-C   Consent:    Consent obtained:  Verbal   Risks discussed:  Infection and pain Universal protocol:    Procedure explained and questions answered to patient or proxy's satisfaction: yes     Patient identity confirmed:  Verbally with patient Anesthesia:    Anesthesia method:  Topical application Laceration details:    Location:  Face   Face location:  Forehead   Length (cm):  1   Depth (mm):  3 Pre-procedure details:    Preparation:  Patient was prepped and draped in usual sterile fashion Exploration:    Limited defect created (wound extended): no     Contaminated: no   Treatment:    Area cleansed with:  Povidone-iodine   Visualized foreign bodies/material removed: no     Debridement:  None   Undermining:  None Skin repair:    Repair method:  Tissue adhesive Approximation:    Approximation:  Close Repair type:    Repair type:  Simple Post-procedure details:    Dressing:  Open (no dressing)   MEDICATIONS ORDERED IN  ED: Medications  Tdap (BOOSTRIX) injection 0.5 mL (0.5 mLs Intramuscular Given 04/13/22 1931)     IMPRESSION / MDM / ASSESSMENT AND PLAN / ED COURSE  I reviewed the triage vital signs and the nursing notes.                              Assessment and plan Fall 81 year old female presents to the emergency department after a mechanical fall.  Patient was hypertensive at triage but vital signs otherwise reassuring.  On exam, patient was alert, active and nontoxic-appearing with no apparent neurodeficits noted.  Personally interpreted CTs of the head, face and cervical spine and there was no evidence of intracranial bleed, skull fracture, C-spine fracture or facial fracture.  I recommended Tylenol for pain.  Patient declined stronger pain medications in the emergency department tetanus status was updated prior to discharge.     FINAL CLINICAL IMPRESSION(S) / ED DIAGNOSES   Final diagnoses:  Injury of head, initial encounter     Rx / DC Orders   ED Discharge Orders     None        Note:  This document was prepared using Dragon voice recognition software and may include unintentional dictation errors.   Pia Mau [place], PA-C 04/13/22 1940    Merwyn Katos, MD 04/13/22 [phone]

## 2022-04-13 NOTE — ED Notes (Signed)
D/c pt once 15 minute mark since receiving IM injection. Pt and visitor agreeable to wait until 1948 to leave.

## 2022-04-13 NOTE — ED Provider Triage Note (Signed)
Emergency Medicine Provider Triage Evaluation Note  Mary Mosley , a 81 y.o. female  was evaluated in triage.  Pt complains of facial pain and swelling after patient fell forward and hit her face while trying to start up her leaf blower.  Patient has severe right-sided periorbital edema with ecchymosis.  No blood thinner usage.  No chest pain, chest tightness or abdominal pain.  Patient has been ambulating easily.  Review of Systems  Positive: Patient has facial pain.  Negative: No chest pain or abdominal pain.   Physical Exam  BP (!) 197/89 (BP Location: Left Arm)   Pulse 91   Temp 98.4 F (36.9 C) (Oral)   Resp 16   Ht 5\' 6"  (1.676 m)   Wt 55.8 kg   SpO2 99%   BMI 19.85 kg/m  Gen:   Awake, no distress   Resp:  Normal effort  MSK:   Moves extremities without difficulty  Other:    Medical Decision Making  Medically screening exam initiated at 5:31 PM.  Appropriate orders placed.  was informed that the remainder of the evaluation will be completed by another provider, this initial triage assessment does not replace that evaluation, and the importance of remaining in the ED until their evaluation is complete.     [name] [place], [place] 04/13/22 1732

## 2022-04-13 NOTE — ED Notes (Signed)
RN in room to assess pt, pt in CT at this time.

## 2022-04-13 NOTE — ED Notes (Signed)
Pt states that she was trying to crank her leaf blower this afternoon around 1230. Pt states that the leaf blower slipped and she fell. Pt hit the right side of her face on the floor. Pt denies use of blood thinners. Pt has large hematoma and small laceration to the right forehead. Pt has large area of bruising and swelling around the right eye. Pt is not able to open her eye due to swelling. Pt denies pain in her neck and is not tender on palpation.

## 2022-04-13 NOTE — ED Triage Notes (Signed)
Pt reports that she was trying to crank her leaf blower when she fell face first into the concrete. Has hematoma and bruising to rt eye. Pt denies LOC. Pt reports many years since tetanus shot.

## 2022-04-22 NOTE — Progress Notes (Signed)
I,Sulibeya S Dimas,acting as a Neurosurgeon for Shirlee Latch, MD.,have documented all relevant documentation on the behalf of Shirlee Latch, MD,as directed by  Shirlee Latch, MD while in the presence of Shirlee Latch, MD.   Established patient visit   Patient: Mary Mosley   DOB: October 15, 1941   81 y.o. Female  MRN: [id number] Visit Date: 04/25/2022  Today's healthcare provider: Shirlee Latch, MD   Chief Complaint  Patient presents with   Follow-up   Subjective    HPI  Follow up ER visit  Patient was seen in ER for head injury on 04/13/22. large right-sided periorbital hematoma with small overlying abrasion after patient fell forward while attempting to use a leaf blower.  She was treated for head injury. Treatment for this included CT for neck, head, and face without fracture. She reports excellent compliance with treatment. She reports this condition is Improved.   DG Thoracic Spine 2 View  Result Date: 04/06/2022 CLINICAL DATA:  Thoracic pain. EXAM: THORACIC SPINE 2 VIEWS COMPARISON:  None Available. FINDINGS: There is no evidence of thoracic spine fracture. Scoliosis of spine is noted. Degenerative joint changes of the lower thoracic upper lumbar spine noted. There is chronic compression deformity of a upper lumbar vertebral body. IMPRESSION: No acute fracture or dislocation. Degenerative joint changes of spine. 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 Electronically Signed   By: Sherian Rein M.D.   On: 04/06/2022 10:58   DG Lumbar Spine Complete W/Bend  Result Date: 04/06/2022 CLINICAL DATA:  Low back pain for years. EXAM: LUMBAR SPINE - COMPLETE WITH BENDING VIEWS COMPARISON:  None Available. FINDINGS: There is no evidence of lumbar spine fracture. Scoliosis of spine is noted. Chronic compression deformity of L1 and L2 are noted. Degenerative joint changes with narrowed joint space and osteophyte formation are identified in the upper  and lower lumbar spine. IMPRESSION: No acute fracture or dislocation identified. Degenerative joint changes of lumbar spine. Electronically Signed   By: Sherian Rein M.D.   On: 04/06/2022 10:59   CT Head Wo Contrast  Result Date: 04/13/2022 CLINICAL DATA:  Trauma, fall EXAM: CT HEAD WITHOUT CONTRAST TECHNIQUE: Contiguous axial images were obtained from the base of the skull through the vertex without intravenous contrast. RADIATION DOSE REDUCTION: This exam was performed according to the departmental dose-optimization program which includes automated exposure control, adjustment of the mA and/or kV according to patient size and/or use of iterative reconstruction technique. COMPARISON:  None Available. FINDINGS: Brain: No acute intracranial findings are seen. There are no signs of intracranial bleeding. Cortical sulci are prominent. There is no focal mass effect. Vascular: There are scattered arterial calcifications. Skull: No fracture is seen in the calvarium. There is large subcutaneous hematoma in the right periorbital region. Sinuses/Orbits: There are no air-fluid levels in the paranasal sinuses. Other: None. IMPRESSION: No acute intracranial findings are seen in noncontrast CT brain. There is subcutaneous hematoma in the right frontal scalp. No fracture is seen in the calvarium. Electronically Signed   By: Ernie Avena M.D.   On: 04/13/2022 18:34   CT Cervical Spine Wo Contrast  Result Date: 04/13/2022 CLINICAL DATA:  Trauma, fall EXAM: CT CERVICAL SPINE WITHOUT CONTRAST TECHNIQUE: Multidetector CT imaging of the cervical spine was performed without intravenous contrast. Multiplanar CT image reconstructions were also generated. RADIATION DOSE REDUCTION: This exam was performed according to the departmental dose-optimization program which includes automated exposure control, adjustment of the mA and/or kV according to patient size and/or  use of iterative reconstruction technique. COMPARISON:  None  Available. FINDINGS: Alignment: Alignment of posterior margins of vertebral bodies is unremarkable. Skull base and vertebrae: No recent fracture is seen. Small linear smooth marginated calcification adjacent to the spinous process of C6 vertebra may suggest ligament calcification from previous injury. Soft tissues and spinal canal: There is no central spinal stenosis. Disc levels: There is mild encroachment of neural foramina at C6-C7 level. Upper chest: Linear densities seen in both apices suggesting scarring. Other: Thyroid is enlarged with inhomogeneous attenuation and multiple nodules of varying attenuation. IMPRESSION: No recent fracture is seen in the cervical spine. There is mild encroachment of neural foramina by bony spurs at C6-C7 level. 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 Electronically Signed   By: Ernie Avena M.D.   On: 04/13/2022 18:41   CT Maxillofacial Wo Contrast  Result Date: 04/13/2022 CLINICAL DATA:  Trauma, fall EXAM: CT MAXILLOFACIAL WITHOUT CONTRAST TECHNIQUE: Multidetector CT imaging of the maxillofacial structures was performed. Multiplanar CT image reconstructions were also generated. RADIATION DOSE REDUCTION: This exam was performed according to the departmental dose-optimization program which includes automated exposure control, adjustment of the mA and/or kV according to patient size and/or use of iterative reconstruction technique. COMPARISON:  None Available. FINDINGS: Osseous: No displaced fractures are seen. Orbits: No focal abnormality is seen. Optic globes are symmetrical. Retrobulbar soft tissues are unremarkable. There is subcutaneous contusion/hematoma in the right periorbital region. Sinuses: There are no air-fluid levels or significant mucosal thickening. Soft tissues: There is subcutaneous hematoma in the right periorbital region. Limited intracranial: Unremarkable. IMPRESSION: No recent fracture is seen in the facial bones. There is large right  periorbital subcutaneous hematoma. Electronically Signed   By: Ernie Avena M.D.   On: 04/13/2022 18:36    -----------------------------------------------------------------------------------------   Medications: Outpatient Medications Prior to Visit  Medication Sig   cholecalciferol (VITAMIN D) 1000 UNITS tablet Take 2 tablets by mouth daily.   gabapentin (NEURONTIN) 100 MG capsule Take 1 capsule (100 mg total) by mouth at bedtime for 10 days, THEN 2 capsules (200 mg total) at bedtime for 10 days, THEN 3 capsules (300 mg total) at bedtime. Follow written titration schedule..   hydrochlorothiazide (HYDRODIURIL) 12.5 MG tablet Take 1 tablet (12.5 mg total) by mouth daily.   LORazepam (ATIVAN) 1 MG tablet Take 0.5 tablets (0.5 mg total) by mouth at bedtime as needed.   traZODone (DESYREL) 50 MG tablet Take 1-2 tablets (50-100 mg total) by mouth at bedtime.   No facility-administered medications prior to visit.    Review of Systems  Constitutional:  Negative for activity change.  Eyes:  Negative for visual disturbance.  Cardiovascular:  Negative for chest pain.  Gastrointestinal:  Negative for nausea and vomiting.  Skin:  Positive for color change.  Neurological:  Negative for headaches.       Objective    BP (!) 150/60 (BP Location: Left Arm, Patient Position: Sitting, Cuff Size: Normal)   Pulse (!) 178   Temp 98.7 F (37.1 C) (Oral)   Resp 16   Wt 126 lb (57.2 kg)   SpO2 100%   BMI 20.34 kg/m  BP Readings from Last 3 Encounters:  04/25/22 (!) 150/60  04/13/22 (!) 159/85  04/05/22 (!) 176/97   Wt Readings from Last 3 Encounters:  04/25/22 126 lb (57.2 kg)  04/13/22 123 lb (55.8 kg)  04/05/22 126 lb (57.2 kg)      Physical Exam Vitals reviewed.  Constitutional:      General: She  is not in acute distress.    Appearance: She is well-developed.  HENT:     Head:     Comments: Bruising of face R>L , draining into neck Eyes:     General: No scleral icterus.     Conjunctiva/sclera: Conjunctivae normal.  Cardiovascular:     Rate and Rhythm: Normal rate and regular rhythm.  Pulmonary:     Effort: Pulmonary effort is normal. No respiratory distress.  Skin:    General: Skin is warm and dry.  Neurological:     Mental Status: She is alert and oriented to person, place, and time.  Psychiatric:        Behavior: Behavior normal.      No results found for any visits on 04/25/22.  Assessment & Plan     1. Facial hematoma, subsequent encounter 2. Fall, subsequent encounter - improving well - discussed using vaseline on abrasion over R eye  - bruising will continue to settle down the face with gravity, but it is improving - reassured about no fractures or intracranial bleeds - no headaches, confusion or concussive symptoms - return precautions discussed  No follow-ups on file.      Total time spent on today's visit was greater than 30 minutes, including both face-to-face time and nonface-to-face time personally spent on review of chart (labs and imaging), discussing imaging and goals, answering patient's questions, and coordinating care.   I, Shirlee LatchAngela Josafat Enrico, MD, have reviewed all documentation for this visit. The documentation on 04/25/22 for the exam, diagnosis, procedures, and orders are all accurate and complete.   Serita Degroote, Marzella SchleinAngela M, MD, MPH [facility]

## 2022-04-25 ENCOUNTER — Encounter: Payer: Self-pay | Admitting: Family Medicine

## 2022-04-25 ENCOUNTER — Ambulatory Visit (INDEPENDENT_AMBULATORY_CARE_PROVIDER_SITE_OTHER): Payer: Medicare Other | Admitting: Family Medicine

## 2022-04-25 VITALS — BP 150/60 | HR 178 | Temp 98.7°F | Resp 16 | Wt 126.0 lb

## 2022-04-25 DIAGNOSIS — W19XXXD Unspecified fall, subsequent encounter: Secondary | ICD-10-CM | POA: Diagnosis not present

## 2022-04-25 DIAGNOSIS — S0083XD Contusion of other part of head, subsequent encounter: Secondary | ICD-10-CM

## 2022-04-28 ENCOUNTER — Encounter: Payer: Self-pay | Admitting: Student in an Organized Health Care Education/Training Program

## 2022-04-28 ENCOUNTER — Ambulatory Visit
Payer: Medicare Other | Attending: Student in an Organized Health Care Education/Training Program | Admitting: Student in an Organized Health Care Education/Training Program

## 2022-04-28 VITALS — BP 176/96 | HR 88 | Temp 97.1°F | Ht 66.0 in | Wt 126.0 lb

## 2022-04-28 DIAGNOSIS — M546 Pain in thoracic spine: Secondary | ICD-10-CM | POA: Insufficient documentation

## 2022-04-28 DIAGNOSIS — G8929 Other chronic pain: Secondary | ICD-10-CM

## 2022-04-28 DIAGNOSIS — M48062 Spinal stenosis, lumbar region with neurogenic claudication: Secondary | ICD-10-CM | POA: Diagnosis not present

## 2022-04-28 DIAGNOSIS — M5134 Other intervertebral disc degeneration, thoracic region: Secondary | ICD-10-CM | POA: Diagnosis not present

## 2022-04-28 DIAGNOSIS — M47816 Spondylosis without myelopathy or radiculopathy, lumbar region: Secondary | ICD-10-CM

## 2022-04-28 DIAGNOSIS — G894 Chronic pain syndrome: Secondary | ICD-10-CM | POA: Diagnosis not present

## 2022-04-28 DIAGNOSIS — M5136 Other intervertebral disc degeneration, lumbar region: Secondary | ICD-10-CM | POA: Insufficient documentation

## 2022-04-28 DIAGNOSIS — M51369 Other intervertebral disc degeneration, lumbar region without mention of lumbar back pain or lower extremity pain: Secondary | ICD-10-CM

## 2022-04-28 MED ORDER — GABAPENTIN 100 MG PO CAPS: 100.0000 mg | ORAL_CAPSULE | Freq: Every day | ORAL | 2 refills | Status: DC

## 2022-04-28 NOTE — Progress Notes (Signed)
PROVIDER NOTE: Information contained herein reflects review and annotations entered in association with encounter. Interpretation of such information and data should be left to medically-trained personnel. Information provided to patient can be located elsewhere in the medical record under "Patient Instructions". Document created using STT-dictation technology, any transcriptional errors that may result from process are unintentional.    Patient: Mary Mosley  Service Category: E/M  Provider: Gillis Santa, MD  DOB: 1941/01/10  DOS: 04/28/2022  Specialty: Interventional Pain Management  MRN: [id number]  Setting: Ambulatory outpatient  PCP: Birdie Sons, MD  Type: Established Patient    Referring Provider: Birdie Sons, MD  Location: Office  Delivery: Face-to-face     Primary Reason(s) for Visit: Encounter for evaluation before starting new chronic pain management plan of care (Level of risk: moderate) CC: Back Pain (lower)  HPI  Mary Mosley is a 81 y.o. year old, female patient, who comes today for a follow-up evaluation to review the test results and decide on a treatment plan. She has Breast calcification, right; Edema; Insomnia; Myalgia; OSA (obstructive sleep apnea); Osteopenia; Primary hypertension; DDD (degenerative disc disease), cervical; Thoracic degenerative disc disease; Scoliosis; Spinal stenosis, lumbar region, with neurogenic claudication; Lumbar degenerative disc disease; Lumbar facet arthropathy; Chronic bilateral thoracic back pain; and Chronic pain syndrome on their problem list. Her primarily concern today is the Back Pain (lower)  Pain Assessment: Location: Right, Left, Lower Back Radiating: pain goes up back Onset: More than a month ago Duration: Chronic pain Quality: Aching, Constant, Nagging Severity: 3 /10 (subjective, self-reported pain score)  Effect on ADL: limits my daily activities Timing: Constant Modifying factors: heat and meds BP: (!) 176/96  HR:  88  Mary Mosley comes in today for a follow-up visit after her initial evaluation on 04/05/2022.  Unfortunately, since her initial clinic visit, patient did present to the emergency department after she fell and hit her head trying to start a leaf blower.  She has some bruising along her right forehead and right ocular area.  She recently increased her gabapentin from 200 mg nightly to 300 mg and she states that it is resulting in side effects of sedation and cognitive fog.  She states that she was not experiencing any side effects at 100 mg so I encouraged her to reduce her dose back down to 100 mg and continue at that for the next 4 weeks.  We also reviewed her x-rays as below.  They show lumbar facet arthropathy and lumbar spondylosis.  We discussed diagnostic lumbar facet medial branch nerve blocks and possible RFA.  As needed order placed below.  Please see HPI from initial clinic visit below 04/05/2022: Mary Mosley is a pleasant 81 year old female who presents with a chief complaint of mid thoracic and lumbar spine pain that does not radiate frequently to her lower extremity.  This has been going on for many years.  She has a history of thoracic and lumbar degenerative disc disease as well as scoliosis of the lower thoracic and lumbar spine.  She has multilevel lumbar disc herniations as well.  She has not had any imaging in the last 5 years.  She tries to remain fairly active.  She utilizes Tylenol at home.  She also utilizes a heat and her massager.  She is having increased pain and has been referred by her primary care provider to explore other options.  She states that she saw Dr. Trenton Gammon with neurosurgery many years ago who advised against surgery and recommended pain management for  her.  Patient denies any history of chronic kidney disease.  Review of her CMP shows normal creatinine.  She takes ibuprofen as needed.  She has not had any injections in the last 5 years.  She does find relief with bending forward.   She states that going from sitting to standing is very painful.  She does have trouble sleeping at night.  Further details on both, my assessment(s), as well as the proposed treatment plan, please see below.  Laboratory Chemistry Profile   Renal Lab Results  Component Value Date   BUN 11 08/23/2021   CREATININE 0.96 08/23/2021   BCR 11 (L) 08/23/2021   GFRAA 74 09/11/2020   GFRNONAA 64 09/11/2020   SPECGRAV 1.025 07/31/2019   PHUR 6.0 07/31/2019   PROTEINUR Positive (A) 07/31/2019     Electrolytes Lab Results  Component Value Date   NA 144 08/23/2021   K 5.1 08/23/2021   CL 104 08/23/2021   CALCIUM 10.6 (H) 08/23/2021     Hepatic Lab Results  Component Value Date   AST 18 08/23/2021   ALT 9 08/23/2021   ALBUMIN 5.2 (H) 08/23/2021   ALKPHOS 68 08/23/2021     ID No results found for: "LYMEIGGIGMAB", "HIV", "SARSCOV2NAA", "STAPHAUREUS", "MRSAPCR", "HCVAB", "PREGTESTUR", "RMSFIGG", "QFVRPH1IGG", "QFVRPH2IGG"   Bone Lab Results  Component Value Date   VD25OH 56.4 09/17/2018     Endocrine Lab Results  Component Value Date   GLUCOSE 97 08/23/2021   TSH 0.687 09/11/2020     Neuropathy Lab Results  Component Value Date   VITAMINB12 930 09/11/2020     CNS No results found for: "COLORCSF", "APPEARCSF", "RBCCOUNTCSF", "WBCCSF", "POLYSCSF", "LYMPHSCSF", "EOSCSF", "PROTEINCSF", "GLUCCSF", "JCVIRUS", "CSFOLI", "IGGCSF", "LABACHR", "ACETBL"   Inflammation (CRP: Acute  ESR: Chronic) No results found for: "CRP", "ESRSEDRATE", "LATICACIDVEN"   Rheumatology No results found for: "RF", "ANA", "LABURIC", "URICUR", "LYMEIGGIGMAB", "LYMEABIGMQN", "HLAB27"   Coagulation Lab Results  Component Value Date   PLT 195 08/23/2021     Cardiovascular Lab Results  Component Value Date   HGB 14.3 08/23/2021   HCT 43.6 08/23/2021     Screening No results found for: "SARSCOV2NAA", "COVIDSOURCE", "STAPHAUREUS", "MRSAPCR", "HCVAB", "HIV", "PREGTESTUR"   Cancer No results  found for: "CEA", "CA125", "LABCA2"   Allergens No results found for: "ALMOND", "APPLE", "ASPARAGUS", "AVOCADO", "BANANA", "BARLEY", "BASIL", "BAYLEAF", "GREENBEAN", "LIMABEAN", "WHITEBEAN", "BEEFIGE", "REDBEET", "BLUEBERRY", "BROCCOLI", "CABBAGE", "MELON", "CARROT", "CASEIN", "CASHEWNUT", "CAULIFLOWER", "CELERY"     Note: Lab results reviewed.  Recent Diagnostic Imaging Review  Cervical Imaging: Cervical MR wo contrast: Results for orders placed in visit on 04/12/01  MR Cervical Spine Wo Contrast  Narrative FINDINGS CLINICAL HISTORY:   LEFT SHOULDER PAIN EXTENDING INTO ARM FOR THE PAST SIX MONTHS. CERVICAL SPINE MR WITHOUT CONTRAST: NO COMPARISON PLAIN FILMS. FINDINGS: THE CERVICOMEDULLARY JUNCTION AND VISUALIZED INTRACRANIAL STRUCTURES ARE GROSSLY WITHIN NORMAL LIMITS.  THE CERVICAL SPINAL CORD IS OF NORMAL CALIBER WITHOUT FOCAL SIGNAL ABNORMALITY. THERE IS HETEROGENEOUS ENLARGEMENT OF THE RIGHT LOBE OF THE THYROID GLAND WHICH MAY REPRESENT A GOITER.  ULTRASOUND COULD BE OBTAINED FOR FURTHER DELINEATION. C3-4:     MINIMAL BULGE. C6-7:     MILD DISK DEGENERATION AND DISK SPACE NARROWING WITH SMALL BROAD BASED DISK PROTRUSION WITH PROTRUDING DISK MATERIAL MOST NOTABLE IN A LEFT LATERAL POSITION CAUSING IMPRESSION UPON THE LEFT VENTRAL ASPECT OF THE THECAL SAC AND SLIGHT NARROWING ALONG THE ORIGIN OF THE LEFT C6-7 NEURAL FORAMEN POSSIBLY CAUSING MASS EFFECT UPON THE LEFT C7 VENTRAL NERVE ROOT. OTHERWISE NEGATIVE EXAM. IMPRESSION:  1.   C6-7   BROAD BASED DISK PROTRUSION WITH GREATER EXTENSION IN A LEFT POSTEROLATERAL/LATERAL POSITION AS DISCUSSED ABOVE. 2.   C3-4   MINIMAL BULGE. 3.    HETEROGENEOUS ENLARGEMENT OF RIGHT LOBE OF THE THYROID GLAND.  ULTRASOUND COULD BE OBTAINED FOR FURTHER DELINEATION. CLINICAL HISTORY:    BACK PAIN EXTENDING INTO TOES BILATERALLY. LUMBAR SPINE MRI: NO COMPARISON PLAIN FILMS.  FOR THE PRESENT EXAMINATION I WILL ASSUME THERE ARE FIVE NON-RIB- BEARING  LUMBAR SPINE VERTEBRAE.  PRIOR TO ANY INTERVENTION, CORRELATION WITH PLAIN FILMS TO CONFIRM THE LEVEL ASSIGNMENT IS RECOMMENDED. FINDINGS: THERE IS A SCOLIOSIS OF THE LOWER THORACIC AND LUMBAR SPINE.  THE CONUS IS LOCATED AT THE T12-L1 DISK SPACE LEVEL. T11-12:     NEGATIVE. T12-L1:     MILD DISK DEGENERATION AND BULGE. L1-2:         MODERATE DISK DEGENERATION AND BULGE.  ROTATION. L2-3:         MODERATE DISK DEGENERATION, DISK SPACE NARROWING, RIGHT LATERAL OSTEOPHYTE, ROTATION, RETROLISTHESIS, BROAD BASED BULGE/PROTRUSION, FACET JOINT BONY OVERGROWTH WITH SUBSEQUENT MILD SPINAL STENOSIS.  RIGHT LATERAL OSTEOPHYTE ENCROACHES UPON THE COURSE OF THE EXITING RIGHT L2 NERVE ROOT WHICH DOES NOT APPEAR SIGNIFICANTLY COMPRESSED. L3-4:         MILD DISK DEGENERATION AND BULGE. L4-5:         MILD DISK DEGENERATION WITH A SMALL BROAD BASED DISK PROTRUSION WITH SLIGHTLY GREATER EXTENSION IN A LEFT POSTEROLATERAL POSITION WITH MILD LEFT-SIDED SUBARTICULAR LATERAL RECESS STENOSIS WITH MINIMAL MASS EFFECT UPON THE ORIGIN OF THE LEFT L5 NERVE ROOT. L5-S1:      MODERATE DISK DEGENERATION AND DISK SPACE NARROWING WITH SMALL OSTEOPHYTE FORMATION AND MINIMAL BULGE.  BILATERAL FACET JOINT DEGENERATIVE CHANGES.  NO SIGNIFICANT SPINAL STENOSIS OR NEURAL FORAMINAL NARROWING. RIGHT RENAL  PARAPELVIC CYST.  THIS IS INCOMPLETELY EVALUATED ON THE PRESENT EXAM. IMPRESSION  Cervical MR wo contrast: No valid procedures specified. Cervical MR w/wo contrast: No results found for this or any previous visit.  Cervical CT wo contrast: Results for orders placed during the hospital encounter of 04/13/22  CT Cervical Spine Wo Contrast  Narrative CLINICAL DATA:  Trauma, fall  EXAM: CT CERVICAL SPINE WITHOUT CONTRAST  TECHNIQUE: Multidetector CT imaging of the cervical spine was performed without intravenous contrast. Multiplanar CT image reconstructions were also generated.  RADIATION DOSE REDUCTION: This exam was  performed according to the departmental dose-optimization program which includes automated exposure control, adjustment of the mA and/or kV according to patient size and/or use of iterative reconstruction technique.  COMPARISON:  None Available.  FINDINGS: Alignment: Alignment of posterior margins of vertebral bodies is unremarkable.  Skull base and vertebrae: No recent fracture is seen. Small linear smooth marginated calcification adjacent to the spinous process of C6 vertebra may suggest ligament calcification from previous injury.  Soft tissues and spinal canal: There is no central spinal stenosis.  Disc levels: There is mild encroachment of neural foramina at C6-C7 level.  Upper chest: Linear densities seen in both apices suggesting scarring.  Other: Thyroid is enlarged with inhomogeneous attenuation and multiple nodules of varying attenuation.  IMPRESSION: No recent fracture is seen in the cervical spine.  There is mild encroachment of neural foramina by bony spurs at C6-C7 level.  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   Electronically Signed By: Elmer Picker M.D. On: 04/13/2022 18:41  MR Lumbar Spine Wo Contrast  Narrative FINDINGS CLINICAL HISTORY:   LEFT SHOULDER PAIN EXTENDING INTO ARM FOR THE PAST SIX MONTHS.  CERVICAL SPINE MR WITHOUT CONTRAST: NO COMPARISON PLAIN FILMS. FINDINGS: THE CERVICOMEDULLARY JUNCTION AND VISUALIZED INTRACRANIAL STRUCTURES ARE GROSSLY WITHIN NORMAL LIMITS.  THE CERVICAL SPINAL CORD IS OF NORMAL CALIBER WITHOUT FOCAL SIGNAL ABNORMALITY. THERE IS HETEROGENEOUS ENLARGEMENT OF THE RIGHT LOBE OF THE THYROID GLAND WHICH MAY REPRESENT A GOITER.  ULTRASOUND COULD BE OBTAINED FOR FURTHER DELINEATION. C3-4:     MINIMAL BULGE. C6-7:     MILD DISK DEGENERATION AND DISK SPACE NARROWING WITH SMALL BROAD BASED DISK PROTRUSION WITH PROTRUDING DISK MATERIAL MOST NOTABLE IN A LEFT LATERAL POSITION CAUSING IMPRESSION UPON THE LEFT  VENTRAL ASPECT OF THE THECAL SAC AND SLIGHT NARROWING ALONG THE ORIGIN OF THE LEFT C6-7 NEURAL FORAMEN POSSIBLY CAUSING MASS EFFECT UPON THE LEFT C7 VENTRAL NERVE ROOT. OTHERWISE NEGATIVE EXAM. IMPRESSION: 1.   C6-7   BROAD BASED DISK PROTRUSION WITH GREATER EXTENSION IN A LEFT POSTEROLATERAL/LATERAL POSITION AS DISCUSSED ABOVE. 2.   C3-4   MINIMAL BULGE. 3.    HETEROGENEOUS ENLARGEMENT OF RIGHT LOBE OF THE THYROID GLAND.  ULTRASOUND COULD BE OBTAINED FOR FURTHER DELINEATION. CLINICAL HISTORY:    BACK PAIN EXTENDING INTO TOES BILATERALLY. LUMBAR SPINE MRI: NO COMPARISON PLAIN FILMS.  FOR THE PRESENT EXAMINATION I WILL ASSUME THERE ARE FIVE NON-RIB- BEARING LUMBAR SPINE VERTEBRAE.  PRIOR TO ANY INTERVENTION, CORRELATION WITH PLAIN FILMS TO CONFIRM THE LEVEL ASSIGNMENT IS RECOMMENDED. FINDINGS: THERE IS A SCOLIOSIS OF THE LOWER THORACIC AND LUMBAR SPINE.  THE CONUS IS LOCATED AT THE T12-L1 DISK SPACE LEVEL. T11-12:     NEGATIVE. T12-L1:     MILD DISK DEGENERATION AND BULGE. L1-2:         MODERATE DISK DEGENERATION AND BULGE.  ROTATION. L2-3:         MODERATE DISK DEGENERATION, DISK SPACE NARROWING, RIGHT LATERAL OSTEOPHYTE, ROTATION, RETROLISTHESIS, BROAD BASED BULGE/PROTRUSION, FACET JOINT BONY OVERGROWTH WITH SUBSEQUENT MILD SPINAL STENOSIS.  RIGHT LATERAL OSTEOPHYTE ENCROACHES UPON THE COURSE OF THE EXITING RIGHT L2 NERVE ROOT WHICH DOES NOT APPEAR SIGNIFICANTLY COMPRESSED. L3-4:         MILD DISK DEGENERATION AND BULGE. L4-5:         MILD DISK DEGENERATION WITH A SMALL BROAD BASED DISK PROTRUSION WITH SLIGHTLY GREATER EXTENSION IN A LEFT POSTEROLATERAL POSITION WITH MILD LEFT-SIDED SUBARTICULAR LATERAL RECESS STENOSIS WITH MINIMAL MASS EFFECT UPON THE ORIGIN OF THE LEFT L5 NERVE ROOT. L5-S1:      MODERATE DISK DEGENERATION AND DISK SPACE NARROWING WITH SMALL OSTEOPHYTE FORMATION AND MINIMAL BULGE.  BILATERAL FACET JOINT DEGENERATIVE CHANGES.  NO SIGNIFICANT SPINAL STENOSIS OR NEURAL  FORAMINAL NARROWING. RIGHT RENAL  PARAPELVIC CYST.  THIS IS INCOMPLETELY EVALUATED ON THE PRESENT EXAM. IMPRESSION   Narrative CLINICAL DATA:  Low back pain for years.  EXAM: LUMBAR SPINE - COMPLETE WITH BENDING VIEWS  COMPARISON:  None Available.  FINDINGS: There is no evidence of lumbar spine fracture. Scoliosis of spine is noted. Chronic compression deformity of L1 and L2 are noted. Degenerative joint changes with narrowed joint space and osteophyte formation are identified in the upper and lower lumbar spine.  IMPRESSION: No acute fracture or dislocation identified. Degenerative joint changes of lumbar spine.   Electronically Signed By: Abelardo Diesel M.D. On: 04/06/2022 10:59         Complexity Note: Imaging results reviewed. Results shared with Mary Mosley, using Layman's terms.                         Meds   Current Outpatient  Medications:    cholecalciferol (VITAMIN D) 1000 UNITS tablet, Take 2 tablets by mouth daily., Disp: , Rfl:    hydrochlorothiazide (HYDRODIURIL) 12.5 MG tablet, Take 1 tablet (12.5 mg total) by mouth daily., Disp: 30 tablet, Rfl: 3   LORazepam (ATIVAN) 1 MG tablet, Take 0.5 tablets (0.5 mg total) by mouth at bedtime as needed., Disp: , Rfl:    traZODone (DESYREL) 50 MG tablet, Take 1-2 tablets (50-100 mg total) by mouth at bedtime., Disp: 30 tablet, Rfl: 1   [START ON 05/25/2022] gabapentin (NEURONTIN) 100 MG capsule, Take 1-2 capsules (100-200 mg total) by mouth at bedtime. Follow written titration schedule., Disp: 60 capsule, Rfl: 2  ROS  Constitutional: Denies any fever or chills Gastrointestinal: No reported hemesis, hematochezia, vomiting, or acute GI distress Musculoskeletal: Denies any acute onset joint swelling, redness, loss of ROM, or weakness Neurological: No reported episodes of acute onset apraxia, aphasia, dysarthria, agnosia, amnesia, paralysis, loss of coordination, or loss of consciousness  Allergies  Mary Mosley is allergic to  amlodipine, fosamax [alendronate sodium], and penicillins.  Alfalfa  Drug: Mary Mosley  reports no history of drug use. Alcohol:  reports no history of alcohol use. Tobacco:  reports that she has never smoked. She has never used smokeless tobacco. Medical:  has a past medical history of Hypertension, OSA (obstructive sleep apnea), and Osteopenia. Surgical: Mary Mosley  has a past surgical history that includes Eye surgery (Bilateral); Tonsillectomy and adenoidectomy; Cataract extraction w/PHACO (Right, 06/07/2021); and Cataract extraction w/PHACO (Left, 06/21/2021). Family: family history includes Heart Problems in her brother and brother; Heart attack in her mother; Liver cancer in her son; Lung cancer in her brother.  Constitutional Exam  General appearance: Well nourished, well developed, and well hydrated. In no apparent acute distress Vitals:   04/28/22 0937  BP: (!) 176/96  Pulse: 88  Temp: (!) 97.1 F (36.2 C)  SpO2: 100%  Weight: 126 lb (57.2 kg)  Height: $Remove'5\' 6"'LGNklCK$  (1.676 m)   BMI Assessment: Estimated body mass index is 20.34 kg/m as calculated from the following:   Height as of this encounter: $RemoveBeforeD'5\' 6"'sAEOAzSDsYhzaK$  (1.676 m).   Weight as of this encounter: 126 lb (57.2 kg).  BMI interpretation table: BMI level Category Range association with higher incidence of chronic pain  <18 kg/m2 Underweight   18.5-24.9 kg/m2 Ideal body weight   25-29.9 kg/m2 Overweight Increased incidence by 20%  30-34.9 kg/m2 Obese (Class I) Increased incidence by 68%  35-39.9 kg/m2 Severe obesity (Class II) Increased incidence by 136%  >40 kg/m2 Extreme obesity (Class III) Increased incidence by 254%   Patient's current BMI Ideal Body weight  Body mass index is 20.34 kg/m. Ideal body weight: 59.3 kg (130 lb 11.7 oz)   BMI Readings from Last 4 Encounters:  04/28/22 20.34 kg/m  04/25/22 20.34 kg/m  04/13/22 19.85 kg/m  04/05/22 20.34 kg/m   Wt Readings from Last 4 Encounters:  04/28/22 126 lb (57.2 kg)   04/25/22 126 lb (57.2 kg)  04/13/22 123 lb (55.8 kg)  04/05/22 126 lb (57.2 kg)    Psych/Mental status: Alert, oriented x 3 (person, place, & time)      left facial bruising Eyes: PERLA Respiratory: No evidence of acute respiratory distress    Thoracic Spine Area Exam  Skin & Axial Inspection: No masses, redness, or swelling Alignment: Asymmetric Functional ROM: Pain restricted ROM Stability: No instability detected Muscle Tone/Strength: Functionally intact. No obvious neuro-muscular anomalies detected. Sensory (Neurological): Musculoskeletal pain pattern Muscle strength & Tone:  No palpable anomalies Lumbar Spine Area Exam  Skin & Axial Inspection: Thoraco-lumbar Scoliosis Alignment: Symmetrical Functional ROM: Pain restricted ROM       Stability: No instability detected Muscle Tone/Strength: Functionally intact. No obvious neuro-muscular anomalies detected. Sensory (Neurological): Musculoskeletal pain pattern   Gait & Posture Assessment  Ambulation: Unassisted Gait: Relatively normal for age and body habitus Posture: Difficulty standing up straight, due to pain  Lower Extremity Exam      Side: Right lower extremity   Side: Left lower extremity  Stability: No instability observed           Stability: No instability observed          Skin & Extremity Inspection: Skin color, temperature, and hair growth are WNL. No peripheral edema or cyanosis. No masses, redness, swelling, asymmetry, or associated skin lesions. No contractures.   Skin & Extremity Inspection: Skin color, temperature, and hair growth are WNL. No peripheral edema or cyanosis. No masses, redness, swelling, asymmetry, or associated skin lesions. No contractures.  Functional ROM: Pain restricted ROM for hip joint           Functional ROM: Unrestricted ROM                  Muscle Tone/Strength: Functionally intact. No obvious neuro-muscular anomalies detected.   Muscle Tone/Strength: Functionally intact. No obvious  neuro-muscular anomalies detected.  Sensory (Neurological): Musculoskeletal pain pattern         Sensory (Neurological): Unimpaired        DTR: Patellar: deferred today Achilles: deferred today Plantar: deferred today   DTR: Patellar: deferred today Achilles: deferred today Plantar: deferred today  Palpation: No palpable anomalies   Palpation: No palpable anomalies       Assessment & Plan  Primary Diagnosis & Pertinent Problem List: The primary encounter diagnosis was Lumbar facet arthropathy. Diagnoses of Lumbar degenerative disc disease, Spinal stenosis, lumbar region, with neurogenic claudication, Lumbar spondylosis, Thoracic degenerative disc disease, Chronic bilateral thoracic back pain, and Chronic pain syndrome were also pertinent to this visit.  Visit Diagnosis: 1. Lumbar facet arthropathy   2. Lumbar degenerative disc disease   3. Spinal stenosis, lumbar region, with neurogenic claudication   4. Lumbar spondylosis   5. Thoracic degenerative disc disease   6. Chronic bilateral thoracic back pain   7. Chronic pain syndrome    Problems updated and reviewed during this visit: Problem  Lumbar Facet Arthropathy    Plan of Care  Pharmacotherapy (Medications Ordered): Meds ordered this encounter  Medications   gabapentin (NEURONTIN) 100 MG capsule    Sig: Take 1-2 capsules (100-200 mg total) by mouth at bedtime. Follow written titration schedule.    Dispense:  60 capsule    Refill:  2    Fill one day early if pharmacy is closed on scheduled refill date. May substitute for generic if available.   Procedure Orders         LUMBAR FACET(MEDIAL BRANCH NERVE BLOCK) MBNB     Orders Placed This Encounter  Procedures   LUMBAR FACET(MEDIAL BRANCH NERVE BLOCK) MBNB    Standing Status:   Standing    Number of Occurrences:   2    Standing Expiration Date:   10/28/2022    Scheduling Instructions:     Procedure: Lumbar facet block (AKA.: Lumbosacral medial branch nerve block)      Side: Bilateral     Level: L3-4, L4-5, & L5-S1 Facets (L3, L4, L5, Medial Branch Nerves)     PRN  Order Specific Question:   Where will this procedure be performed?    Answer:   [facility]     Provider-requested follow-up: Return in about 10 weeks (around 07/07/2022) for Medication Management, in person. Or earlier for PRN Lumbar MBNBs  I spent a total of 30 minutes reviewing chart data, face-to-face evaluation with the patient, counseling and coordination of care as detailed above.   Recent Visits Date Type Provider Dept  04/05/22 Office Visit Gillis Santa, MD [facility]  Showing recent visits within past 90 days and meeting all other requirements Today's Visits Date Type Provider Dept  04/28/22 Office Visit Gillis Santa, MD [facility]  Showing today's visits and meeting all other requirements Future Appointments Date Type Provider Dept  06/30/22 Appointment Gillis Santa, MD [facility]  Showing future appointments within next 90 days and meeting all other requirements  Primary Care Physician: Birdie Sons, MD Note by: Gillis Santa, MD Date: 04/28/2022; Time: 10:35 AM

## 2022-04-28 NOTE — Progress Notes (Signed)
Safety precautions to be maintained throughout the outpatient stay will include: orient to surroundings, keep bed in low position, maintain call bell within reach at all times, provide assistance with transfer out of bed and ambulation.  

## 2022-05-19 ENCOUNTER — Other Ambulatory Visit: Payer: Self-pay | Admitting: Family Medicine

## 2022-05-19 DIAGNOSIS — G4700 Insomnia, unspecified: Secondary | ICD-10-CM

## 2022-05-19 MED ORDER — TRAZODONE HCL 50 MG PO TABS: 50.0000 mg | ORAL_TABLET | Freq: Every day | ORAL | 1 refills | Status: DC

## 2022-05-19 NOTE — Telephone Encounter (Signed)
Requested Prescriptions  Pending Prescriptions Disp Refills  . traZODone (DESYREL) 50 MG tablet 90 tablet 1    Sig: Take 1-2 tablets (50-100 mg total) by mouth at bedtime.     Psychiatry: Antidepressants - Serotonin Modulator Passed - 05/19/2022 11:39 AM      Passed - Valid encounter within last 6 months    Recent Outpatient Visits          3 weeks ago Facial hematoma, subsequent encounter   [facility] [place], Marzella Schlein, MD   2 months ago Primary hypertension   [facility] Malva Limes, MD   3 months ago Primary hypertension   [facility] Malva Limes, MD   9 months ago Primary hypertension   [facility] Malva Limes, MD   1 year ago Primary hypertension   [facility] Malva Limes, MD      Future Appointments            In 3 months Fisher, Demetrios Isaacs, MD [facility]

## 2022-05-19 NOTE — Telephone Encounter (Signed)
Medication Refill - Medication: traZODone (DESYREL) 50 MG tablet  Has the patient contacted their pharmacy? No.  Preferred Pharmacy (with phone number or street name):  [facility] [place], [place] - [address] Phone:  [phone]  Fax:  [phone]      Has the patient been seen for an appointment in the last year OR does the patient have an upcoming appointment? Yes.    Agent: Please be advised that RX refills may take up to 3 business days. We ask that you follow-up with your pharmacy.

## 2022-06-03 ENCOUNTER — Other Ambulatory Visit: Payer: Self-pay | Admitting: Family Medicine

## 2022-06-03 DIAGNOSIS — I10 Essential (primary) hypertension: Secondary | ICD-10-CM

## 2022-06-03 MED ORDER — HYDROCHLOROTHIAZIDE 12.5 MG PO TABS: 12.5000 mg | ORAL_TABLET | Freq: Every day | ORAL | 3 refills | Status: DC

## 2022-06-03 NOTE — Telephone Encounter (Signed)
Copied from CRM [id number]. Topic: General - Other >> Jun 03, 2022 10:24 AM Everette C wrote: Reason for CRM: Medication Refill - Medication: hydrochlorothiazide (HYDRODIURIL) 12.5 MG tablet [[id number]]   Has the patient contacted their pharmacy? No.The patient was uncertain of the medication's status  (Agent: If no, request that the patient contact the pharmacy for the refill. If patient does not wish to contact the pharmacy document the reason why and proceed with request.) (Agent: If yes, when and what did the pharmacy advise?)  Preferred Pharmacy (with phone number or street name): [facility] [address] [place] - [address] [place] [address] Phone: [phone] Fax: [phone] Hours: Not open 24 hours   Has the patient been seen for an appointment in the last year OR does the patient have an upcoming appointment? Yes.    Agent: Please be advised that RX refills may take up to 3 business days. We ask that you follow-up with your pharmacy.

## 2022-06-03 NOTE — Telephone Encounter (Signed)
Requested medication (s) are due for refill today:   Yes  Requested medication (s) are on the active medication list:   Yes  Future visit scheduled:   Yes   Last ordered: 02/07/2022 #30, 3 refills  Returned because labs overdue per protocol.   Requested Prescriptions  Pending Prescriptions Disp Refills   hydrochlorothiazide (HYDRODIURIL) 12.5 MG tablet 30 tablet 3    Sig: Take 1 tablet (12.5 mg total) by mouth daily.     Cardiovascular: Diuretics - Thiazide Failed - 06/03/2022 12:00 PM      Failed - Cr in normal range and within 180 days    Creatinine, Ser  Date Value Ref Range Status  08/23/2021 0.96 0.57 - 1.00 mg/dL Final         Failed - K in normal range and within 180 days    Potassium  Date Value Ref Range Status  08/23/2021 5.1 3.5 - 5.2 mmol/L Final         Failed - Na in normal range and within 180 days    Sodium  Date Value Ref Range Status  08/23/2021 144 134 - 144 mmol/L Final         Failed - Last BP in normal range    BP Readings from Last 1 Encounters:  04/28/22 (!) 176/96         Passed - Valid encounter within last 6 months    Recent Outpatient Visits           1 month ago Facial hematoma, subsequent encounter   [facility] [place], Marzella Schlein, MD   2 months ago Primary hypertension   [facility] Malva Limes, MD   3 months ago Primary hypertension   [facility] Malva Limes, MD   9 months ago Primary hypertension   [facility] Malva Limes, MD   1 year ago Primary hypertension   [facility] Fisher, Demetrios Isaacs, MD       Future Appointments             In 3 months Fisher, Demetrios Isaacs, MD [facility]

## 2022-06-06 ENCOUNTER — Other Ambulatory Visit: Payer: Self-pay | Admitting: Family Medicine

## 2022-06-06 DIAGNOSIS — Z1231 Encounter for screening mammogram for malignant neoplasm of breast: Secondary | ICD-10-CM

## 2022-06-30 ENCOUNTER — Ambulatory Visit
Payer: Medicare Other | Attending: Student in an Organized Health Care Education/Training Program | Admitting: Student in an Organized Health Care Education/Training Program

## 2022-06-30 ENCOUNTER — Encounter: Payer: Self-pay | Admitting: Student in an Organized Health Care Education/Training Program

## 2022-06-30 VITALS — BP 181/65 | HR 97 | Temp 97.0°F | Resp 16 | Ht 67.0 in | Wt 120.0 lb

## 2022-06-30 DIAGNOSIS — M47816 Spondylosis without myelopathy or radiculopathy, lumbar region: Secondary | ICD-10-CM | POA: Diagnosis not present

## 2022-06-30 DIAGNOSIS — M48062 Spinal stenosis, lumbar region with neurogenic claudication: Secondary | ICD-10-CM

## 2022-06-30 DIAGNOSIS — G894 Chronic pain syndrome: Secondary | ICD-10-CM

## 2022-06-30 DIAGNOSIS — M5136 Other intervertebral disc degeneration, lumbar region: Secondary | ICD-10-CM | POA: Diagnosis not present

## 2022-06-30 DIAGNOSIS — M51369 Other intervertebral disc degeneration, lumbar region without mention of lumbar back pain or lower extremity pain: Secondary | ICD-10-CM

## 2022-06-30 NOTE — Progress Notes (Signed)
Safety precautions to be maintained throughout the outpatient stay will include: orient to surroundings, keep bed in low position, maintain call bell within reach at all times, provide assistance with transfer out of bed and ambulation.  

## 2022-06-30 NOTE — Progress Notes (Signed)
PROVIDER NOTE: Information contained herein reflects review and annotations entered in association with encounter. Interpretation of such information and data should be left to medically-trained personnel. Information provided to patient can be located elsewhere in the medical record under "Patient Instructions". Document created using STT-dictation technology, any transcriptional errors that may result from process are unintentional.    Patient: Mary Mosley  Service Category: E/M  Provider: Gillis Santa, MD  DOB: 24-Jun-1941  DOS: 06/30/2022  Specialty: Interventional Pain Management  MRN: [id number]  Setting: Ambulatory outpatient  PCP: Birdie Sons, MD  Type: Established Patient    Referring Provider: Birdie Sons, MD  Location: Office  Delivery: Face-to-face     Primary Reason(s) for Visit:  (Level of risk: moderate) CC: Back Pain  HPI  Mary Mosley is a 81 y.o. year old, female patient, who comes today for a follow-up evaluation to review the test results and decide on a treatment plan. She has Breast calcification, right; Edema; Insomnia; Myalgia; OSA (obstructive sleep apnea); Osteopenia; Primary hypertension; DDD (degenerative disc disease), cervical; Thoracic degenerative disc disease; Scoliosis; Spinal stenosis, lumbar region, with neurogenic claudication; Lumbar degenerative disc disease; Lumbar facet arthropathy; Chronic bilateral thoracic back pain; and Chronic pain syndrome on their problem list. Her primarily concern today is the Back Pain  Pain Assessment: Location: Lower Back Radiating: denies Onset: More than a month ago Duration: Chronic pain Quality: Throbbing Severity: 5 /10 (subjective, self-reported pain score)  Effect on ADL: Limits activities Timing: Constant Modifying factors: aleve BP: (!) 181/65  HR: 97  Patient presents today for her follow-up appointment.  She continues to have persistent low back pain that is worse with lumbar extension and facet  loading.  We reduced her gabapentin down to 100 mg nightly which is still causing her sedation and imbalance.  I have instructed her to discontinue that.  Given that she has failed conservative therapy, we discussed diagnostic lumbar facet milligrams nerve blocks which we have also discussed in the past.  She states that she would like to move forward with these now.  04/28/22: Mary Mosley comes in today for a follow-up visit after her initial evaluation on 04/05/2022.  Unfortunately, since her initial clinic visit, patient did present to the emergency department after she fell and hit her head trying to start a leaf blower.  She has some bruising along her right forehead and right ocular area.  She recently increased her gabapentin from 200 mg nightly to 300 mg and she states that it is resulting in side effects of sedation and cognitive fog.  She states that she was not experiencing any side effects at 100 mg so I encouraged her to reduce her dose back down to 100 mg and continue at that for the next 4 weeks.  We also reviewed her x-rays as below.  They show lumbar facet arthropathy and lumbar spondylosis.  We discussed diagnostic lumbar facet medial branch nerve blocks and possible RFA.  As needed order placed below.  Please see HPI from initial clinic visit below 04/05/2022: Mary Mosley is a pleasant 81 year old female who presents with a chief complaint of mid thoracic and lumbar spine pain that does not radiate frequently to her lower extremity.  This has been going on for many years.  She has a history of thoracic and lumbar degenerative disc disease as well as scoliosis of the lower thoracic and lumbar spine.  She has multilevel lumbar disc herniations as well.  She has not had any imaging in the  last 5 years.  She tries to remain fairly active.  She utilizes Tylenol at home.  She also utilizes a heat and her massager.  She is having increased pain and has been referred by her primary care provider to explore  other options.  She states that she saw Dr. Trenton Gammon with neurosurgery many years ago who advised against surgery and recommended pain management for her.  Patient denies any history of chronic kidney disease.  Review of her CMP shows normal creatinine.  She takes ibuprofen as needed.  She has not had any injections in the last 5 years.  She does find relief with bending forward.  She states that going from sitting to standing is very painful.  She does have trouble sleeping at night.  Further details on both, my assessment(s), as well as the proposed treatment plan, please see below.  Laboratory Chemistry Profile   Renal Lab Results  Component Value Date   BUN 11 08/23/2021   CREATININE 0.96 08/23/2021   BCR 11 (L) 08/23/2021   GFRAA 74 09/11/2020   GFRNONAA 64 09/11/2020   SPECGRAV 1.025 07/31/2019   PHUR 6.0 07/31/2019   PROTEINUR Positive (A) 07/31/2019     Electrolytes Lab Results  Component Value Date   NA 144 08/23/2021   K 5.1 08/23/2021   CL 104 08/23/2021   CALCIUM 10.6 (H) 08/23/2021     Hepatic Lab Results  Component Value Date   AST 18 08/23/2021   ALT 9 08/23/2021   ALBUMIN 5.2 (H) 08/23/2021   ALKPHOS 68 08/23/2021     ID No results found for: "LYMEIGGIGMAB", "HIV", "SARSCOV2NAA", "STAPHAUREUS", "MRSAPCR", "HCVAB", "PREGTESTUR", "RMSFIGG", "QFVRPH1IGG", "QFVRPH2IGG"   Bone Lab Results  Component Value Date   VD25OH 56.4 09/17/2018     Endocrine Lab Results  Component Value Date   GLUCOSE 97 08/23/2021   TSH 0.687 09/11/2020     Neuropathy Lab Results  Component Value Date   VITAMINB12 930 09/11/2020     CNS No results found for: "COLORCSF", "APPEARCSF", "RBCCOUNTCSF", "WBCCSF", "POLYSCSF", "LYMPHSCSF", "EOSCSF", "PROTEINCSF", "GLUCCSF", "JCVIRUS", "CSFOLI", "IGGCSF", "LABACHR", "ACETBL"   Inflammation (CRP: Acute  ESR: Chronic) No results found for: "CRP", "ESRSEDRATE", "LATICACIDVEN"   Rheumatology No results found for: "RF", "ANA",  "LABURIC", "URICUR", "LYMEIGGIGMAB", "LYMEABIGMQN", "HLAB27"   Coagulation Lab Results  Component Value Date   PLT 195 08/23/2021     Cardiovascular Lab Results  Component Value Date   HGB 14.3 08/23/2021   HCT 43.6 08/23/2021     Screening No results found for: "SARSCOV2NAA", "COVIDSOURCE", "STAPHAUREUS", "MRSAPCR", "HCVAB", "HIV", "PREGTESTUR"   Cancer No results found for: "CEA", "CA125", "LABCA2"   Allergens No results found for: "ALMOND", "APPLE", "ASPARAGUS", "AVOCADO", "BANANA", "BARLEY", "BASIL", "BAYLEAF", "GREENBEAN", "LIMABEAN", "WHITEBEAN", "BEEFIGE", "REDBEET", "BLUEBERRY", "BROCCOLI", "CABBAGE", "MELON", "CARROT", "CASEIN", "CASHEWNUT", "CAULIFLOWER", "CELERY"     Note: Lab results reviewed.  Recent Diagnostic Imaging Review  Cervical Imaging: Cervical MR wo contrast: Results for orders placed in visit on 04/12/01  MR Cervical Spine Wo Contrast  Narrative FINDINGS CLINICAL HISTORY:   LEFT SHOULDER PAIN EXTENDING INTO ARM FOR THE PAST SIX MONTHS. CERVICAL SPINE MR WITHOUT CONTRAST: NO COMPARISON PLAIN FILMS. FINDINGS: THE CERVICOMEDULLARY JUNCTION AND VISUALIZED INTRACRANIAL STRUCTURES ARE GROSSLY WITHIN NORMAL LIMITS.  THE CERVICAL SPINAL CORD IS OF NORMAL CALIBER WITHOUT FOCAL SIGNAL ABNORMALITY. THERE IS HETEROGENEOUS ENLARGEMENT OF THE RIGHT LOBE OF THE THYROID GLAND WHICH MAY REPRESENT A GOITER.  ULTRASOUND COULD BE OBTAINED FOR FURTHER DELINEATION. C3-4:     MINIMAL BULGE. C6-7:  MILD DISK DEGENERATION AND DISK SPACE NARROWING WITH SMALL BROAD BASED DISK PROTRUSION WITH PROTRUDING DISK MATERIAL MOST NOTABLE IN A LEFT LATERAL POSITION CAUSING IMPRESSION UPON THE LEFT VENTRAL ASPECT OF THE THECAL SAC AND SLIGHT NARROWING ALONG THE ORIGIN OF THE LEFT C6-7 NEURAL FORAMEN POSSIBLY CAUSING MASS EFFECT UPON THE LEFT C7 VENTRAL NERVE ROOT. OTHERWISE NEGATIVE EXAM. IMPRESSION: 1.   C6-7   BROAD BASED DISK PROTRUSION WITH GREATER EXTENSION IN A LEFT  POSTEROLATERAL/LATERAL POSITION AS DISCUSSED ABOVE. 2.   C3-4   MINIMAL BULGE. 3.    HETEROGENEOUS ENLARGEMENT OF RIGHT LOBE OF THE THYROID GLAND.  ULTRASOUND COULD BE OBTAINED FOR FURTHER DELINEATION. CLINICAL HISTORY:    BACK PAIN EXTENDING INTO TOES BILATERALLY. LUMBAR SPINE MRI: NO COMPARISON PLAIN FILMS.  FOR THE PRESENT EXAMINATION I WILL ASSUME THERE ARE FIVE NON-RIB- BEARING LUMBAR SPINE VERTEBRAE.  PRIOR TO ANY INTERVENTION, CORRELATION WITH PLAIN FILMS TO CONFIRM THE LEVEL ASSIGNMENT IS RECOMMENDED. FINDINGS: THERE IS A SCOLIOSIS OF THE LOWER THORACIC AND LUMBAR SPINE.  THE CONUS IS LOCATED AT THE T12-L1 DISK SPACE LEVEL. T11-12:     NEGATIVE. T12-L1:     MILD DISK DEGENERATION AND BULGE. L1-2:         MODERATE DISK DEGENERATION AND BULGE.  ROTATION. L2-3:         MODERATE DISK DEGENERATION, DISK SPACE NARROWING, RIGHT LATERAL OSTEOPHYTE, ROTATION, RETROLISTHESIS, BROAD BASED BULGE/PROTRUSION, FACET JOINT BONY OVERGROWTH WITH SUBSEQUENT MILD SPINAL STENOSIS.  RIGHT LATERAL OSTEOPHYTE ENCROACHES UPON THE COURSE OF THE EXITING RIGHT L2 NERVE ROOT WHICH DOES NOT APPEAR SIGNIFICANTLY COMPRESSED. L3-4:         MILD DISK DEGENERATION AND BULGE. L4-5:         MILD DISK DEGENERATION WITH A SMALL BROAD BASED DISK PROTRUSION WITH SLIGHTLY GREATER EXTENSION IN A LEFT POSTEROLATERAL POSITION WITH MILD LEFT-SIDED SUBARTICULAR LATERAL RECESS STENOSIS WITH MINIMAL MASS EFFECT UPON THE ORIGIN OF THE LEFT L5 NERVE ROOT. L5-S1:      MODERATE DISK DEGENERATION AND DISK SPACE NARROWING WITH SMALL OSTEOPHYTE FORMATION AND MINIMAL BULGE.  BILATERAL FACET JOINT DEGENERATIVE CHANGES.  NO SIGNIFICANT SPINAL STENOSIS OR NEURAL FORAMINAL NARROWING. RIGHT RENAL  PARAPELVIC CYST.  THIS IS INCOMPLETELY EVALUATED ON THE PRESENT EXAM. IMPRESSION  Cervical MR wo contrast: No valid procedures specified. Cervical MR w/wo contrast: No results found for this or any previous visit.  Cervical CT wo contrast:  Results for orders placed during the hospital encounter of 04/13/22  CT Cervical Spine Wo Contrast  Narrative CLINICAL DATA:  Trauma, fall  EXAM: CT CERVICAL SPINE WITHOUT CONTRAST  TECHNIQUE: Multidetector CT imaging of the cervical spine was performed without intravenous contrast. Multiplanar CT image reconstructions were also generated.  RADIATION DOSE REDUCTION: This exam was performed according to the departmental dose-optimization program which includes automated exposure control, adjustment of the mA and/or kV according to patient size and/or use of iterative reconstruction technique.  COMPARISON:  None Available.  FINDINGS: Alignment: Alignment of posterior margins of vertebral bodies is unremarkable.  Skull base and vertebrae: No recent fracture is seen. Small linear smooth marginated calcification adjacent to the spinous process of C6 vertebra may suggest ligament calcification from previous injury.  Soft tissues and spinal canal: There is no central spinal stenosis.  Disc levels: There is mild encroachment of neural foramina at C6-C7 level.  Upper chest: Linear densities seen in both apices suggesting scarring.  Other: Thyroid is enlarged with inhomogeneous attenuation and multiple nodules of varying attenuation.  IMPRESSION: No recent fracture is  seen in the cervical spine.  There is mild encroachment of neural foramina by bony spurs at C6-C7 level.  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   Electronically Signed By: Elmer Picker M.D. On: 04/13/2022 18:41  MR Lumbar Spine Wo Contrast  Narrative FINDINGS CLINICAL HISTORY:   LEFT SHOULDER PAIN EXTENDING INTO ARM FOR THE PAST SIX MONTHS. CERVICAL SPINE MR WITHOUT CONTRAST: NO COMPARISON PLAIN FILMS. FINDINGS: THE CERVICOMEDULLARY JUNCTION AND VISUALIZED INTRACRANIAL STRUCTURES ARE GROSSLY WITHIN NORMAL LIMITS.  THE CERVICAL SPINAL CORD IS OF NORMAL CALIBER WITHOUT FOCAL SIGNAL  ABNORMALITY. THERE IS HETEROGENEOUS ENLARGEMENT OF THE RIGHT LOBE OF THE THYROID GLAND WHICH MAY REPRESENT A GOITER.  ULTRASOUND COULD BE OBTAINED FOR FURTHER DELINEATION. C3-4:     MINIMAL BULGE. C6-7:     MILD DISK DEGENERATION AND DISK SPACE NARROWING WITH SMALL BROAD BASED DISK PROTRUSION WITH PROTRUDING DISK MATERIAL MOST NOTABLE IN A LEFT LATERAL POSITION CAUSING IMPRESSION UPON THE LEFT VENTRAL ASPECT OF THE THECAL SAC AND SLIGHT NARROWING ALONG THE ORIGIN OF THE LEFT C6-7 NEURAL FORAMEN POSSIBLY CAUSING MASS EFFECT UPON THE LEFT C7 VENTRAL NERVE ROOT. OTHERWISE NEGATIVE EXAM. IMPRESSION: 1.   C6-7   BROAD BASED DISK PROTRUSION WITH GREATER EXTENSION IN A LEFT POSTEROLATERAL/LATERAL POSITION AS DISCUSSED ABOVE. 2.   C3-4   MINIMAL BULGE. 3.    HETEROGENEOUS ENLARGEMENT OF RIGHT LOBE OF THE THYROID GLAND.  ULTRASOUND COULD BE OBTAINED FOR FURTHER DELINEATION. CLINICAL HISTORY:    BACK PAIN EXTENDING INTO TOES BILATERALLY. LUMBAR SPINE MRI: NO COMPARISON PLAIN FILMS.  FOR THE PRESENT EXAMINATION I WILL ASSUME THERE ARE FIVE NON-RIB- BEARING LUMBAR SPINE VERTEBRAE.  PRIOR TO ANY INTERVENTION, CORRELATION WITH PLAIN FILMS TO CONFIRM THE LEVEL ASSIGNMENT IS RECOMMENDED. FINDINGS: THERE IS A SCOLIOSIS OF THE LOWER THORACIC AND LUMBAR SPINE.  THE CONUS IS LOCATED AT THE T12-L1 DISK SPACE LEVEL. T11-12:     NEGATIVE. T12-L1:     MILD DISK DEGENERATION AND BULGE. L1-2:         MODERATE DISK DEGENERATION AND BULGE.  ROTATION. L2-3:         MODERATE DISK DEGENERATION, DISK SPACE NARROWING, RIGHT LATERAL OSTEOPHYTE, ROTATION, RETROLISTHESIS, BROAD BASED BULGE/PROTRUSION, FACET JOINT BONY OVERGROWTH WITH SUBSEQUENT MILD SPINAL STENOSIS.  RIGHT LATERAL OSTEOPHYTE ENCROACHES UPON THE COURSE OF THE EXITING RIGHT L2 NERVE ROOT WHICH DOES NOT APPEAR SIGNIFICANTLY COMPRESSED. L3-4:         MILD DISK DEGENERATION AND BULGE. L4-5:         MILD DISK DEGENERATION WITH A SMALL BROAD BASED DISK  PROTRUSION WITH SLIGHTLY GREATER EXTENSION IN A LEFT POSTEROLATERAL POSITION WITH MILD LEFT-SIDED SUBARTICULAR LATERAL RECESS STENOSIS WITH MINIMAL MASS EFFECT UPON THE ORIGIN OF THE LEFT L5 NERVE ROOT. L5-S1:      MODERATE DISK DEGENERATION AND DISK SPACE NARROWING WITH SMALL OSTEOPHYTE FORMATION AND MINIMAL BULGE.  BILATERAL FACET JOINT DEGENERATIVE CHANGES.  NO SIGNIFICANT SPINAL STENOSIS OR NEURAL FORAMINAL NARROWING. RIGHT RENAL  PARAPELVIC CYST.  THIS IS INCOMPLETELY EVALUATED ON THE PRESENT EXAM. IMPRESSION   Narrative CLINICAL DATA:  Low back pain for years.  EXAM: LUMBAR SPINE - COMPLETE WITH BENDING VIEWS  COMPARISON:  None Available.  FINDINGS: There is no evidence of lumbar spine fracture. Scoliosis of spine is noted. Chronic compression deformity of L1 and L2 are noted. Degenerative joint changes with narrowed joint space and osteophyte formation are identified in the upper and lower lumbar spine.  IMPRESSION: No acute fracture or dislocation identified. Degenerative joint changes of lumbar spine.  Electronically Signed By: Abelardo Diesel M.D. On: 04/06/2022 10:59         Complexity Note: Imaging results reviewed. Results shared with Ms. Faller, using Layman's terms.                         Meds   Current Outpatient Medications:    cholecalciferol (VITAMIN D) 1000 UNITS tablet, Take 2 tablets by mouth daily., Disp: , Rfl:    hydrochlorothiazide (HYDRODIURIL) 12.5 MG tablet, Take 1 tablet (12.5 mg total) by mouth daily., Disp: 30 tablet, Rfl: 3   LORazepam (ATIVAN) 1 MG tablet, Take 0.5 tablets (0.5 mg total) by mouth at bedtime as needed., Disp: , Rfl:    traZODone (DESYREL) 50 MG tablet, Take 1-2 tablets (50-100 mg total) by mouth at bedtime., Disp: 90 tablet, Rfl: 1   gabapentin (NEURONTIN) 100 MG capsule, Take 1-2 capsules (100-200 mg total) by mouth at bedtime. Follow written titration schedule. (Patient not taking: Reported on 06/30/2022), Disp: 60  capsule, Rfl: 2  ROS  Constitutional: Denies any fever or chills Gastrointestinal: No reported hemesis, hematochezia, vomiting, or acute GI distress Musculoskeletal: Denies any acute onset joint swelling, redness, loss of ROM, or weakness Neurological: No reported episodes of acute onset apraxia, aphasia, dysarthria, agnosia, amnesia, paralysis, loss of coordination, or loss of consciousness  Allergies  Ms. Hollopeter is allergic to amlodipine, fosamax [alendronate sodium], and penicillins.  [place]  Drug: Ms. Leppert  reports no history of drug use. Alcohol:  reports no history of alcohol use. Tobacco:  reports that she has never smoked. She has never used smokeless tobacco. Medical:  has a past medical history of Hypertension, OSA (obstructive sleep apnea), and Osteopenia. Surgical: Ms. Mukherjee  has a past surgical history that includes Eye surgery (Bilateral); Tonsillectomy and adenoidectomy; Cataract extraction w/PHACO (Right, 06/07/2021); and Cataract extraction w/PHACO (Left, 06/21/2021). Family: family history includes Heart Problems in her brother and brother; Heart attack in her mother; Liver cancer in her son; Lung cancer in her brother.  Constitutional Exam  General appearance: Well nourished, well developed, and well hydrated. In no apparent acute distress Vitals:   06/30/22 1041  BP: (!) 181/65  Pulse: 97  Resp: 16  Temp: (!) 97 F (36.1 C)  SpO2: 100%  Weight: 120 lb (54.4 kg)  Height: _0  (1.702 m)   BMI Assessment: Estimated body mass index is 18.79 kg/m as calculated from the following:   Height as of this encounter: _1  (1.702 m).   Weight as of this encounter: 120 lb (54.4 kg).  BMI interpretation table: BMI level Category Range association with higher incidence of chronic pain  <18 kg/m2 Underweight   18.5-24.9 kg/m2 Ideal body weight   25-29.9 kg/m2 Overweight Increased incidence by 20%  30-34.9 kg/m2 Obese (Class I) Increased incidence by 68%  35-39.9 kg/m2  Severe obesity (Class II) Increased incidence by 136%  >40 kg/m2 Extreme obesity (Class III) Increased incidence by 254%   Patient's current BMI Ideal Body weight  Body mass index is 18.79 kg/m. Ideal body weight: 61.6 kg (135 lb 12.9 oz)   BMI Readings from Last 4 Encounters:  06/30/22 18.79 kg/m  04/28/22 20.34 kg/m  04/25/22 20.34 kg/m  04/13/22 19.85 kg/m   Wt Readings from Last 4 Encounters:  06/30/22 120 lb (54.4 kg)  04/28/22 126 lb (57.2 kg)  04/25/22 126 lb (57.2 kg)  04/13/22 123 lb (55.8 kg)    Psych/Mental status: Alert, oriented x 3 (person,  place, & time)      left facial bruising Eyes: PERLA Respiratory: No evidence of acute respiratory distress    Thoracic Spine Area Exam  Skin & Axial Inspection: No masses, redness, or swelling Alignment: Asymmetric Functional ROM: Pain restricted ROM Stability: No instability detected Muscle Tone/Strength: Functionally intact. No obvious neuro-muscular anomalies detected. Sensory (Neurological): Musculoskeletal pain pattern Muscle strength & Tone: No palpable anomalies Lumbar Spine Area Exam  Skin & Axial Inspection: Thoraco-lumbar Scoliosis Alignment: Symmetrical Functional ROM: Pain restricted ROM       Stability: No instability detected Muscle Tone/Strength: Functionally intact. No obvious neuro-muscular anomalies detected. Sensory (Neurological): Musculoskeletal pain pattern Positive pain with lumbar facet loading consistent with facet mediated pain   Gait & Posture Assessment  Ambulation: Unassisted Gait: Relatively normal for age and body habitus Posture: Difficulty standing up straight, due to pain  Lower Extremity Exam      Side: Right lower extremity   Side: Left lower extremity  Stability: No instability observed           Stability: No instability observed          Skin & Extremity Inspection: Skin color, temperature, and hair growth are WNL. No peripheral edema or cyanosis. No masses, redness,  swelling, asymmetry, or associated skin lesions. No contractures.   Skin & Extremity Inspection: Skin color, temperature, and hair growth are WNL. No peripheral edema or cyanosis. No masses, redness, swelling, asymmetry, or associated skin lesions. No contractures.  Functional ROM: Pain restricted ROM for hip joint           Functional ROM: Unrestricted ROM                  Muscle Tone/Strength: Functionally intact. No obvious neuro-muscular anomalies detected.   Muscle Tone/Strength: Functionally intact. No obvious neuro-muscular anomalies detected.  Sensory (Neurological): Musculoskeletal pain pattern         Sensory (Neurological): Unimpaired        DTR: Patellar: deferred today Achilles: deferred today Plantar: deferred today   DTR: Patellar: deferred today Achilles: deferred today Plantar: deferred today  Palpation: No palpable anomalies   Palpation: No palpable anomalies       Assessment & Plan  Primary Diagnosis & Pertinent Problem List: The primary encounter diagnosis was Lumbar facet arthropathy. Diagnoses of Lumbar degenerative disc disease, Spinal stenosis, lumbar region, with neurogenic claudication, Lumbar spondylosis, and Chronic pain syndrome were also pertinent to this visit.  Visit Diagnosis: 1. Lumbar facet arthropathy   2. Lumbar degenerative disc disease   3. Spinal stenosis, lumbar region, with neurogenic claudication   4. Lumbar spondylosis   5. Chronic pain syndrome    Problems updated and reviewed during this visit: No problems updated.   Plan of [place] has a history of greater than 3 months of moderate to severe pain which is resulted in functional impairment.  The patient has tried various conservative therapeutic options such as NSAIDs, Tylenol, muscle relaxants, physical therapy which was inadequately effective.  Patient's pain is predominantly axial with physical exam and L-MRI findings suggestive of facet arthropathy.  Lumbar facet  medial branch nerve blocks were discussed with the patient.  Risks and benefits were reviewed.  Patient would like to proceed with bilateral L3, L4, L5 medial branch nerve block.   Procedure Orders         LUMBAR FACET(MEDIAL BRANCH NERVE BLOCK) MBNB     Orders Placed This Encounter  Procedures   LUMBAR FACET(MEDIAL BRANCH  NERVE BLOCK) MBNB    Standing Status:   Future    Standing Expiration Date:   09/30/2022    Scheduling Instructions:     Procedure: Lumbar facet block (AKA.: Lumbosacral medial branch nerve block)     Side: Bilateral     Level: L3-4 & L5-S1 Facets (L3, L4, L5, Medial Branch Nerves)     Sedation: without     Timeframe: ASAA    Order Specific Question:   Where will this procedure be performed?    Answer:   [facility]     Provider-requested follow-up: Return in about 2 weeks (around 07/14/2022) for B/L L3, 4, 5 MBNB , in clinic NS.  I spent a total of 30 minutes reviewing chart data, face-to-face evaluation with the patient, counseling and coordination of care as detailed above.   Recent Visits Date Type Provider Dept  04/28/22 Office Visit Gillis Santa, MD [facility]  04/05/22 Office Visit Gillis Santa, MD [facility]  Showing recent visits within past 90 days and meeting all other requirements Today's Visits Date Type Provider Dept  06/30/22 Office Visit Gillis Santa, MD [facility]  Showing today's visits and meeting all other requirements Future Appointments No visits were found meeting these conditions. Showing future appointments within next 90 days and meeting all other requirements  Primary Care Physician: Birdie Sons, MD Note by: Gillis Santa, MD Date: 06/30/2022; Time: 10:55 AM

## 2022-07-08 ENCOUNTER — Ambulatory Visit
Admission: RE | Admit: 2022-07-08 | Discharge: 2022-07-08 | Disposition: A | Discharge status: Home / Self Care | Payer: Medicare Other | Source: Ambulatory Visit | Attending: Family Medicine | Admitting: Family Medicine

## 2022-07-08 DIAGNOSIS — Z1231 Encounter for screening mammogram for malignant neoplasm of breast: Secondary | ICD-10-CM | POA: Insufficient documentation

## 2022-07-08 DIAGNOSIS — Z7689 Persons encountering health services in other specified circumstances: Secondary | ICD-10-CM | POA: Diagnosis not present

## 2022-07-13 ENCOUNTER — Ambulatory Visit
Payer: Medicare Other | Attending: Student in an Organized Health Care Education/Training Program | Admitting: Student in an Organized Health Care Education/Training Program

## 2022-07-13 ENCOUNTER — Encounter: Payer: Self-pay | Admitting: Student in an Organized Health Care Education/Training Program

## 2022-07-13 ENCOUNTER — Ambulatory Visit
Admission: RE | Admit: 2022-07-13 | Discharge: 2022-07-13 | Disposition: A | Discharge status: Home / Self Care | Payer: Medicare Other | Source: Ambulatory Visit | Attending: Student in an Organized Health Care Education/Training Program | Admitting: Student in an Organized Health Care Education/Training Program

## 2022-07-13 ENCOUNTER — Other Ambulatory Visit: Payer: Self-pay

## 2022-07-13 VITALS — BP 157/82 | HR 76 | Temp 97.0°F | Resp 16 | Ht 67.0 in | Wt 120.0 lb

## 2022-07-13 DIAGNOSIS — G894 Chronic pain syndrome: Secondary | ICD-10-CM | POA: Insufficient documentation

## 2022-07-13 DIAGNOSIS — M47816 Spondylosis without myelopathy or radiculopathy, lumbar region: Secondary | ICD-10-CM | POA: Insufficient documentation

## 2022-07-13 MED ORDER — DEXAMETHASONE SODIUM PHOSPHATE 10 MG/ML IJ SOLN
10.0000 mg | Freq: Once | INTRAMUSCULAR | Status: AC
  Administered 2022-07-13: 10 mg

## 2022-07-13 MED ORDER — ROPIVACAINE HCL 2 MG/ML IJ SOLN
9.0000 mL | Freq: Once | INTRAMUSCULAR | Status: AC
  Administered 2022-07-13: 9 mL via PERINEURAL

## 2022-07-13 MED ORDER — ROPIVACAINE HCL 2 MG/ML IJ SOLN
INTRAMUSCULAR | Status: AC
  Filled 2022-07-13: qty 20

## 2022-07-13 MED ORDER — LIDOCAINE HCL 2 % IJ SOLN
20.0000 mL | Freq: Once | INTRAMUSCULAR | Status: AC
  Administered 2022-07-13: 400 mg

## 2022-07-13 MED ORDER — LIDOCAINE HCL 2 % IJ SOLN
INTRAMUSCULAR | Status: AC
  Filled 2022-07-13: qty 20

## 2022-07-13 MED ORDER — DEXAMETHASONE SODIUM PHOSPHATE 10 MG/ML IJ SOLN
INTRAMUSCULAR | Status: AC
  Filled 2022-07-13: qty 2

## 2022-07-13 NOTE — Progress Notes (Signed)
Safety precautions to be maintained throughout the outpatient stay will include: orient to surroundings, keep bed in low position, maintain call bell within reach at all times, provide assistance with transfer out of bed and ambulation.  

## 2022-07-13 NOTE — Patient Instructions (Signed)

## 2022-07-13 NOTE — Progress Notes (Signed)
PROVIDER NOTE: Interpretation of information contained herein should be left to medically-trained personnel. Specific patient instructions are provided elsewhere under "Patient Instructions" section of medical record. This document was created in part using STT-dictation technology, any transcriptional errors that may result from this process are unintentional.  Patient: Mary Mosley Type: Established DOB: 09-Jul-1941 MRN: [id number] PCP: Malva Limes, MD  Service: Procedure DOS: 07/13/2022 Setting: Ambulatory Location: Ambulatory outpatient facility Delivery: Face-to-face Provider: Edward Jolly, MD Specialty: Interventional Pain Management Specialty designation: 09 Location: Outpatient facility Ref. Prov.: Malva Limes, MD    Procedure:           Type: Lumbar Facet, Medial Branch Block(s) #1  Laterality: Bilateral  Level: L3, L4, L5, Medial Branch Level(s).  Imaging: Fluoroscopic guidance         Anesthesia: Local anesthesia (1-2% Lidocaine) DOS: 07/13/2022 Performed by: Edward Jolly, MD  Primary Purpose: Diagnostic/Therapeutic Indications: Low back pain severe enough to impact quality of life or function. 1. Lumbar facet arthropathy   2. Lumbar spondylosis   3. Chronic pain syndrome    NAS-11 Pain score:   Pre-procedure: 6 /10   Post-procedure: 6 /10     Position / Prep / Materials:  Position: Prone  Prep solution: DuraPrep (Iodine Povacrylex [0.7% available iodine] and Isopropyl Alcohol, 74% w/w) Area Prepped: Posterolateral Lumbosacral Spine (Wide prep: From the lower border of the scapula down to the end of the tailbone and from flank to flank.)  Materials:  Tray: Block Needle(s):  Type: Spinal  Gauge (G): 22  Length: 5-in Qty: 2     Pre-op H&P Assessment:  Mary Mosley is a 81 y.o. (year old), female patient, seen today for interventional treatment. She  has a past surgical history that includes Eye surgery (Bilateral); Tonsillectomy and adenoidectomy;  Cataract extraction w/PHACO (Right, 06/07/2021); and Cataract extraction w/PHACO (Left, 06/21/2021). Mary Mosley has a current medication list which includes the following prescription(s): cholecalciferol, hydrochlorothiazide, trazodone, gabapentin, and lorazepam. Her primarily concern today is the Back Pain (Bilateral.  Patient reports the pain is going up her spine )  Initial Vital Signs:  Pulse/HCG Rate: 81  Temp:  (!) 97 F (36.1 C) Resp: 16 BP:  (!) 173/87 SpO2: 100 %  BMI: Estimated body mass index is 18.79 kg/m as calculated from the following:   Height as of this encounter: 5\' 7"  (1.702 m).   Weight as of this encounter: 120 lb (54.4 kg).  Risk Assessment: Allergies: Reviewed. She is allergic to amlodipine, fosamax [alendronate sodium], and penicillins.  Allergy Precautions: None required Coagulopathies: Reviewed. None identified.  Blood-thinner therapy: None at this time Active Infection(s): Reviewed. None identified. Mary Mosley is afebrile  Site Confirmation: Mary Mosley was asked to confirm the procedure and laterality before marking the site Procedure checklist: Completed Consent: Before the procedure and under the influence of no sedative(s), amnesic(s), or anxiolytics, the patient was informed of the treatment options, risks and possible complications. To fulfill our ethical and legal obligations, as recommended by the American Medical Association's Code of Ethics, I have informed the patient of my clinical impression; the nature and purpose of the treatment or procedure; the risks, benefits, and possible complications of the intervention; the alternatives, including doing nothing; the risk(s) and benefit(s) of the alternative treatment(s) or procedure(s); and the risk(s) and benefit(s) of doing nothing. The patient was provided information about the general risks and possible complications associated with the procedure. These may include, but are not limited to: failure to  achieve desired  goals, infection, bleeding, organ or nerve damage, allergic reactions, paralysis, and death. In addition, the patient was informed of those risks and complications associated to Spine-related procedures, such as failure to decrease pain; infection (i.e.: Meningitis, epidural or intraspinal abscess); bleeding (i.e.: epidural hematoma, subarachnoid hemorrhage, or any other type of intraspinal or peri-dural bleeding); organ or nerve damage (i.e.: Any type of peripheral nerve, nerve root, or spinal cord injury) with subsequent damage to sensory, motor, and/or autonomic systems, resulting in permanent pain, numbness, and/or weakness of one or several areas of the body; allergic reactions; (i.e.: anaphylactic reaction); and/or death. Furthermore, the patient was informed of those risks and complications associated with the medications. These include, but are not limited to: allergic reactions (i.e.: anaphylactic or anaphylactoid reaction(s)); adrenal axis suppression; blood sugar elevation that in diabetics may result in ketoacidosis or comma; water retention that in patients with history of congestive heart failure may result in shortness of breath, pulmonary edema, and decompensation with resultant heart failure; weight gain; swelling or edema; medication-induced neural toxicity; particulate matter embolism and blood vessel occlusion with resultant organ, and/or nervous system infarction; and/or aseptic necrosis of one or more joints. Finally, the patient was informed that Medicine is not an exact science; therefore, there is also the possibility of unforeseen or unpredictable risks and/or possible complications that may result in a catastrophic outcome. The patient indicated having understood very clearly. We have given the patient no guarantees and we have made no promises. Enough time was given to the patient to ask questions, all of which were answered to the patient's satisfaction. Mary Mosley has  indicated that she wanted to continue with the procedure. Attestation: I, the ordering provider, attest that I have discussed with the patient the benefits, risks, side-effects, alternatives, likelihood of achieving goals, and potential problems during recovery for the procedure that I have provided informed consent. Date  Time: 07/13/2022  8:54 AM  Pre-Procedure Preparation:  Monitoring: As per clinic protocol. Respiration, ETCO2, SpO2, BP, heart rate and rhythm monitor placed and checked for adequate function Safety Precautions: Patient was assessed for positional comfort and pressure points before starting the procedure. Time-out: I initiated and conducted the "Time-out" before starting the procedure, as per protocol. The patient was asked to participate by confirming the accuracy of the "Time Out" information. Verification of the correct person, site, and procedure were performed and confirmed by me, the nursing staff, and the patient. "Time-out" conducted as per Joint Commission's Universal Protocol (UP.01.01.01). Time: 1000  Description of Procedure:          Laterality: Bilateral. The procedure was performed in identical fashion on both sides. Targeted Levels:  L3, L4, L5, Medial Branch Level(s)  Safety Precautions: Aspiration looking for blood return was conducted prior to all injections. At no point did we inject any substances, as a needle was being advanced. Before injecting, the patient was told to immediately notify me if she was experiencing any new onset of "ringing in the ears, or metallic taste in the mouth". No attempts were made at seeking any paresthesias. Safe injection practices and needle disposal techniques used. Medications properly checked for expiration dates. SDV (single dose vial) medications used. After the completion of the procedure, all disposable equipment used was discarded in the proper designated medical waste containers. Local Anesthesia: Protocol guidelines were  followed. The patient was positioned over the fluoroscopy table. The area was prepped in the usual manner. The time-out was completed. The target area was identified using fluoroscopy. A 12-in  long, straight, sterile hemostat was used with fluoroscopic guidance to locate the targets for each level blocked. Once located, the skin was marked with an approved surgical skin marker. Once all sites were marked, the skin (epidermis, dermis, and hypodermis), as well as deeper tissues (fat, connective tissue and muscle) were infiltrated with a small amount of a short-acting local anesthetic, loaded on a 10cc syringe with a 25G, 1.5-in  Needle. An appropriate amount of time was allowed for local anesthetics to take effect before proceeding to the next step. Local Anesthetic: Lidocaine 2.0% The unused portion of the local anesthetic was discarded in the proper designated containers. Technical description of process:   L3 Medial Branch Nerve Block (MBB): The target area for the L3 medial branch is at the junction of the postero-lateral aspect of the superior articular process and the superior, posterior, and medial edge of the transverse process of L4. Under fluoroscopic guidance, a Quincke needle was inserted until contact was made with os over the superior postero-lateral aspect of the pedicular shadow (target area). After negative aspiration for blood, 2 mL of the nerve block solution was injected without difficulty or complication. The needle was removed intact. L4 Medial Branch Nerve Block (MBB): The target area for the L4 medial branch is at the junction of the postero-lateral aspect of the superior articular process and the superior, posterior, and medial edge of the transverse process of L5. Under fluoroscopic guidance, a Quincke needle was inserted until contact was made with os over the superior postero-lateral aspect of the pedicular shadow (target area). After negative aspiration for blood, 2 mL  of the nerve  block solution was injected without difficulty or complication. The needle was removed intact. L5 Medial Branch Nerve Block (MBB): The target area for the L5 medial branch is at the junction of the postero-lateral aspect of the superior articular process and the superior, posterior, and medial edge of the sacral ala. Under fluoroscopic guidance, a Quincke needle was inserted until contact was made with os over the superior postero-lateral aspect of the pedicular shadow (target area). After negative aspiration for blood, 2 mL  of the nerve block solution was injected without difficulty or complication. The needle was removed intact.   12 cc solution made of 10 cc of 0.2% ropivacaine, 2 cc of Decadron 10 mg/cc.  2 cc injected at each level above bilaterally.    Once the entire procedure was completed, the treated area was cleaned, making sure to leave some of the prepping solution back to take advantage of its long term bactericidal properties.         Illustration of the posterior view of the lumbar spine and the posterior neural structures. Laminae of L2 through S1 are labeled. DPRL5, dorsal primary ramus of L5; DPRS1, dorsal primary ramus of S1; DPR3, dorsal primary ramus of L3; FJ, facet (zygapophyseal) joint L3-L4; I, inferior articular process of L4; LB1, lateral branch of dorsal primary ramus of L1; IAB, inferior articular branches from L3 medial branch (supplies L4-L5 facet joint); IBP, intermediate branch plexus; MB3, medial branch of dorsal primary ramus of L3; NR3, third lumbar nerve root; S, superior articular process of L5; SAB, superior articular branches from L4 (supplies L4-5 facet joint also); TP3, transverse process of L3.  Vitals:   07/13/22 1000 07/13/22 1003 07/13/22 1007 07/13/22 1012  BP:  (!) 160/87 (!) 158/83 (!) 157/82  Pulse:  84 76 76  Resp:  13 14 16   Temp:      TempSrc:  SpO2: 100% 99% 99% 99%  Weight:      Height:         Start Time: 1000 hrs. End Time:  1008 hrs.  Imaging Guidance (Spinal):          Type of Imaging Technique: Fluoroscopy Guidance (Spinal) Indication(s): Assistance in needle guidance and placement for procedures requiring needle placement in or near specific anatomical locations not easily accessible without such assistance. Exposure Time: Please see nurses notes. Contrast: None used. Fluoroscopic Guidance: I was personally present during the use of fluoroscopy. "Tunnel Vision Technique" used to obtain the best possible view of the target area. Parallax error corrected before commencing the procedure. "Direction-depth-direction" technique used to introduce the needle under continuous pulsed fluoroscopy. Once target was reached, antero-posterior, oblique, and lateral fluoroscopic projection used confirm needle placement in all planes. Images permanently stored in EMR. Interpretation: No contrast injected. I personally interpreted the imaging intraoperatively. Adequate needle placement confirmed in multiple planes. Permanent images saved into the patient's record.  Antibiotic Prophylaxis:   Anti-infectives (From admission, onward)    None      Indication(s): None identified  Post-operative Assessment:  Post-procedure Vital Signs:  Pulse/HCG Rate: 76  Temp:  (!) 97 F (36.1 C) Resp: 16 BP:  (!) 157/82 SpO2: 99 %  EBL: None  Complications: No immediate post-treatment complications observed by team, or reported by patient.  Note: The patient tolerated the entire procedure well. A repeat set of vitals were taken after the procedure and the patient was kept under observation following institutional policy, for this type of procedure. Post-procedural neurological assessment was performed, showing return to baseline, prior to discharge. The patient was provided with post-procedure discharge instructions, including a section on how to identify potential problems. Should any problems arise concerning this procedure, the  patient was given instructions to immediately contact us, at any time, without hesitation. In any case, we plan to contact the patient by telephone for a follow-up status report regarding this interventional procedure.  Comments:  No additional relevant information.  Plan of Care  Orders:  Orders Placed This Encounter  Procedures   DG PAIN [facility] C-ARM 1-60 MIN NO REPORT    Intraoperative interpretation by procedural physician at [facility].    Standing Status:   Standing    Number of Occurrences:   1    Order Specific Question:   Reason for exam:    Answer:   Assistance in needle guidance and placement for procedures requiring needle placement in or near specific anatomical locations not easily accessible without such assistance.     Medications ordered for procedure: Meds ordered this encounter  Medications   lidocaine (XYLOCAINE) 2 % (with pres) injection 400 mg   dexamethasone (DECADRON) injection 10 mg   dexamethasone (DECADRON) injection 10 mg   ropivacaine (PF) 2 mg/mL (0.2%) (NAROPIN) injection 9 mL   ropivacaine (PF) 2 mg/mL (0.2%) (NAROPIN) injection 9 mL   Medications administered: We administered lidocaine, dexamethasone, dexamethasone, ropivacaine (PF) 2 mg/mL (0.2%), and ropivacaine (PF) 2 mg/mL (0.2%).  See the medical record for exact dosing, route, and time of administration.  Follow-up plan:   Return in about 4 weeks (around 08/10/2022) for Post Procedure Evaluation, in person.       B/L L3,4,5 MBNB #1 07/13/22    Recent Visits Date Type Provider Dept  06/30/22 Office Visit Edward Jolly, MD [facility]  04/28/22 Office Visit Edward Jolly, MD [facility]  Showing recent visits within past 11  days and meeting all other requirements Today's Visits Date Type Provider Dept  07/13/22 Procedure visit Edward JollyLateef, Javier Gell, MD [facility]  Showing today's visits and meeting all other requirements Future Appointments Date Type  Provider Dept  08/10/22 Appointment Edward JollyLateef, Akeia Perot, MD [facility]  Showing future appointments within next 90 days and meeting all other requirements  Disposition: Discharge home  Discharge (Date  Time): 07/13/2022; 1015 hrs.   Primary Care Physician: Malva LimesFisher, Donald E, MD [facility] Note by: Edward JollyBilal Javae Braaten, MD Date: 07/13/2022; Time: 10:26 AM  Disclaimer:  Medicine is not an exact science. The only guarantee in medicine is that nothing is guaranteed. It is important to note that the decision to proceed with this intervention was based on the information collected from the patient. The Data and conclusions were drawn from the patient's questionnaire, the interview, and the physical examination. Because the information was provided in large part by the patient, it cannot be guaranteed that it has not been purposely or unconsciously manipulated. Every effort has been made to obtain as much relevant data as possible for this evaluation. It is important to note that the conclusions that lead to this procedure are derived in large part from the available data. Always take into account that the treatment will also be dependent on availability of resources and existing treatment guidelines, considered by other Pain Management Practitioners as being common knowledge and practice, at the time of the intervention. For Medico-Legal purposes, it is also important to point out that variation in procedural techniques and pharmacological choices are the acceptable norm. The indications, contraindications, technique, and results of the above procedure should only be interpreted and judged by a Board-Certified Interventional Pain Specialist with extensive familiarity and expertise in the same exact procedure and technique.

## 2022-07-14 ENCOUNTER — Telehealth: Payer: Self-pay | Admitting: *Deleted

## 2022-07-14 NOTE — Telephone Encounter (Signed)
No problems post procedure. 

## 2022-07-27 DIAGNOSIS — Z961 Presence of intraocular lens: Secondary | ICD-10-CM | POA: Diagnosis not present

## 2022-08-10 ENCOUNTER — Ambulatory Visit
Payer: Medicare Other | Attending: Student in an Organized Health Care Education/Training Program | Admitting: Student in an Organized Health Care Education/Training Program

## 2022-08-10 ENCOUNTER — Encounter: Payer: Self-pay | Admitting: Student in an Organized Health Care Education/Training Program

## 2022-08-10 VITALS — BP 160/66 | HR 83 | Temp 98.0°F | Resp 18 | Ht 67.0 in | Wt 120.0 lb

## 2022-08-10 DIAGNOSIS — M5134 Other intervertebral disc degeneration, thoracic region: Secondary | ICD-10-CM | POA: Insufficient documentation

## 2022-08-10 DIAGNOSIS — M47894 Other spondylosis, thoracic region: Secondary | ICD-10-CM | POA: Insufficient documentation

## 2022-08-10 DIAGNOSIS — G894 Chronic pain syndrome: Secondary | ICD-10-CM | POA: Diagnosis not present

## 2022-08-10 DIAGNOSIS — M47816 Spondylosis without myelopathy or radiculopathy, lumbar region: Secondary | ICD-10-CM | POA: Diagnosis not present

## 2022-08-10 DIAGNOSIS — M5136 Other intervertebral disc degeneration, lumbar region: Secondary | ICD-10-CM | POA: Diagnosis not present

## 2022-08-10 NOTE — Patient Instructions (Addendum)
______________________________________________________________________  Preparing for your procedure (without sedation)  Procedure appointments are limited to planned procedures: No Prescription Refills. No disability issues will be discussed. No medication changes will be discussed.  Instructions: Food Intake: Avoid eating anything for at least 4 hours prior to your procedure. Transportation: Unless otherwise stated by your physician, bring a driver. Morning Medicines: Take all of your scheduled morning medications. If you take heart medicine, except for blood thinners, do not forget to take it the morning of the procedure. If your Diastolic (lower reading) is above 100 mmHg, elective cases will be cancelled/rescheduled. Blood thinners: These will need to be stopped for procedures. Notify our staff if you are taking any blood thinners. Depending on which one you take, there will be specific instructions on how and when to stop it. Diabetics on insulin: Notify the staff so that you can be scheduled 1st case in the morning. If your diabetes requires high dose insulin, take only  of your normal insulin dose the morning of the procedure and notify the staff that you have done so. Preventing infections: Shower with an antibacterial soap the morning of your procedure.  Build-up your immune system: Take 1000 mg of Vitamin C with every meal (3 times a day) the day prior to your procedure. Antibiotics: Inform the staff if you have a condition or reason that requires you to take antibiotics before dental procedures. Pregnancy: If you are pregnant, call and cancel the procedure. Sickness: If you have a cold, fever, or any active infections, call and cancel the procedure. Arrival: You must be in the facility at least 30 minutes prior to your scheduled procedure. Children: Do not bring any children with you. Dress appropriately: There is always a possibility that your clothing may get soiled. Valuables:  Do not bring any jewelry or valuables.  Reasons to call and reschedule or cancel your procedure: (Following these recommendations will minimize the risk of a serious complication.) Surgeries: Avoid having procedures within 2 weeks of any surgery. (Avoid for 2 weeks before or after any surgery). Flu Shots: Avoid having procedures within 2 weeks of a flu shots or . (Avoid for 2 weeks before or after immunizations). Barium: Avoid having a procedure within 7-10 days after having had a radiological study involving the use of radiological contrast. (Myelograms, Barium swallow or enema study). Heart attacks: Avoid any elective procedures or surgeries for the initial 6 months after a "Myocardial Infarction" (Heart Attack). Blood thinners: It is imperative that you stop these medications before procedures. Let us know if you if you take any blood thinner.  Infection: Avoid procedures during or within two weeks of an infection (including chest colds or gastrointestinal problems). Symptoms associated with infections include: Localized redness, fever, chills, night sweats or profuse sweating, burning sensation when voiding, cough, congestion, stuffiness, runny nose, sore throat, diarrhea, nausea, vomiting, cold or Flu symptoms, recent or current infections. It is specially important if the infection is over the area that we intend to treat. Heart and lung problems: Symptoms that may suggest an active cardiopulmonary problem include: cough, chest pain, breathing difficulties or shortness of breath, dizziness, ankle swelling, uncontrolled high or unusually low blood pressure, and/or palpitations. If you are experiencing any of these symptoms, cancel your procedure and contact your primary care physician for an evaluation.  Remember:  Regular Business hours are:  Monday to Thursday 8:00 AM to 4:00 PM  Provider's Schedule: Milinda Pointer, MD:  Procedure days: Tuesday and Thursday 7:30 AM to 4:00 PM  Gillis Santa, MD:  Procedure days: Monday and Wednesday 7:30 AM to 4:00 PM ______________________________________________________________________  Facet Joint Block The facet joints connect the bones of the spine (vertebrae). They make it possible for you to bend, twist, and make other movements with your spine. They also keep you from bending too far, twisting too far, and making other extreme movements. A facet joint block is a procedure in which a numbing medicine (anesthetic) is injected into a facet joint. In many cases, an anti-inflammatory medicine (steroid) is also injected. A facet joint block may be done: To diagnose neck or back pain. If the pain gets better after a facet joint block, it means the pain is probably coming from the facet joint. If the pain does not get better, it means the pain is probably not coming from the facet joint. To relieve neck or back pain that is caused by an inflamed facet joint. A facet joint block is only done to relieve pain if the pain does not improve with other methods, such as medicine, exercise programs, and physical therapy. Tell a health care provider about: Any allergies you have. All medicines you are taking, including vitamins, herbs, eye drops, creams, and over-the-counter medicines. Any problems you or family members have had with anesthetic medicines. Any blood disorders you have. Any surgeries you have had. Any medical conditions you have or have had. Whether you are pregnant or may be pregnant. What are the risks? Generally, this is a safe procedure. However, problems may occur, including: Bleeding. Injury to a nerve near the injection site. Pain at the injection site. Weakness or numbness in areas controlled by nerves near the injection site. Infection. Temporary fluid retention. Allergic reactions to medicines or dyes. Injury to other structures or organs near the injection site. What happens before the procedure? Medicines Ask your  health care provider about: Changing or stopping your regular medicines. This is especially important if you are taking diabetes medicines or blood thinners. Taking medicines such as aspirin and ibuprofen. These medicines can thin your blood. Do not take these medicines unless your health care provider tells you to take them. Taking over-the-counter medicines, vitamins, herbs, and supplements. Eating and drinking Follow instructions from your health care provider about eating and drinking, which may include: 8 hours before the procedure - stop eating heavy meals or foods, such as meat, fried foods, or fatty foods. 6 hours before the procedure - stop eating light meals or foods, such as toast or cereal. 6 hours before the procedure - stop drinking milk or drinks that contain milk. 2 hours before the procedure - stop drinking clear liquids. Staying hydrated Follow instructions from your health care provider about hydration, which may include: Up to 2 hours before the procedure - you may continue to drink clear liquids, such as water, clear fruit juice, black coffee, and plain tea. General instructions Do not use any products that contain nicotine or tobacco for at least 4-6 weeks before the procedure. These products include cigarettes, e-cigarettes, and chewing tobacco. If you need help quitting, ask your health care provider. Plan to have someone take you home from the hospital or clinic. Ask your health care provider: How your surgery site will be marked. What steps will be taken to help prevent infection. These may include: Removing hair at the surgery site. Washing skin with a germ-killing soap. Receiving antibiotic medicine. What happens during the procedure?  You will put on a hospital gown. You will lie on your stomach  on an X-ray table. You may be asked to lie in a different position if an injection will be made in your neck. Machines will be used to monitor your oxygen levels, heart  rate, and blood pressure. Your skin will be cleaned. If an injection will be made in your neck, an IV will be inserted into one of your veins. Fluids and medicine will flow directly into your body through the IV. A numbing medicine (local anesthetic) will be applied to your skin. Your skin may sting or burn for a moment. A video X-ray machine (fluoroscopy) will be used to find the joint. In some cases, a CT scan may be used. A contrast dye may be injected into the facet joint area to help find the joint. When the joint is located, an anesthetic will be injected into the joint through the needle. Your health care provider will ask you whether you feel pain relief. If you feel relief, a steroid may be injected to provide pain relief for a longer period of time. If you do not feel relief or feel only partial relief, additional injections of an anesthetic may be made in other facet joints. The needle will be removed. Your skin will be cleaned. A bandage (dressing) will be applied over each injection site. The procedure may vary among health care providers and hospitals. What happens after the procedure? Your blood pressure, heart rate, breathing rate, and blood oxygen level will be monitored until you leave the hospital or clinic. You will lie down and rest for a period of time. Summary A facet joint block is a procedure in which a numbing medicine (anesthetic) is injected into a facet joint. An anti-inflammatory medicine (steroid) may also be injected. Follow instructions from your health care provider about medicines and eating and drinking before the procedure. Do not use any products that contain nicotine or tobacco for at least 4-6 weeks before the procedure. You will lie on your stomach for the procedure, but you may be asked to lie in a different position if an injection will be made in your neck. When the joint is located, an anesthetic will be injected into the joint through the  needle. This information is not intended to replace advice given to you by your health care provider. Make sure you discuss any questions you have with your health care provider. Document Revised: 03/09/2022 Document Reviewed: 09/06/2021 Elsevier Patient Education  [place].

## 2022-08-10 NOTE — Progress Notes (Signed)
Safety precautions to be maintained throughout the outpatient stay will include: orient to surroundings, keep bed in low position, maintain call bell within reach at all times, provide assistance with transfer out of bed and ambulation.  

## 2022-08-10 NOTE — Progress Notes (Signed)
PROVIDER NOTE: Information contained herein reflects review and annotations entered in association with encounter. Interpretation of such information and data should be left to medically-trained personnel. Information provided to patient can be located elsewhere in the medical record under "Patient Instructions". Document created using STT-dictation technology, any transcriptional errors that may result from process are unintentional.    Patient: Mary Mosley  Service Category: E/M  Provider: Gillis Santa, MD  DOB: 01-27-1941  DOS: 08/10/2022  Referring Provider: Birdie Sons, MD  MRN: [id number]  Specialty: Interventional Pain Management  PCP: Birdie Sons, MD  Type: Established Patient  Setting: Ambulatory outpatient    Location: Office  Delivery: Face-to-face     HPI  [name], a 81 y.o. 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 Ms. Teixeira's primary complain today is Back Pain (upper) Last encounter: My last encounter with her was on 07/13/2022.  Pain Assessment: Severity of Chronic pain is reported as a 1 /10. Location: Back Upper/denies. Onset: More than a month ago. Quality: Discomfort. Timing: Intermittent. Modifying factor(s): rest. Vitals:  height is _0  (1.702 m) and weight is 120 lb (54.4 kg). Her temperature is 98 F (36.7 C). Her blood pressure is 160/66 (abnormal) and her pulse is 83. Her respiration is 18 and oxygen saturation is 99%.   Reason for encounter: post-procedure evaluation and assessment.   Post-procedure evaluation   Type: Lumbar Facet, Medial Branch Block(s) #1  Laterality: Bilateral  Level: L3, L4, L5, Medial Branch Level(s).  Imaging: Fluoroscopic guidance         Anesthesia: Local anesthesia (1-2% Lidocaine) DOS: 07/13/2022 Performed by: Gillis Santa, MD  Primary Purpose: Diagnostic/Therapeutic Indications: Low back pain severe enough to impact quality of life or function. 1. Lumbar facet  arthropathy   2. Lumbar spondylosis   3. Chronic pain syndrome    NAS-11 Pain score:   Pre-procedure: 6 /10   Post-procedure: 6 /10      Effectiveness:  Initial hour after procedure: 100 %  Subsequent 4-6 hours post-procedure: 100 %  Analgesia past initial 6 hours: 100 % (ongoing)  Ongoing improvement:  Analgesic:  100% Function: Ms. Sima reports improvement in function ROM: Ms. Gambrill reports improvement in ROM   ROS  Constitutional: Denies any fever or chills Gastrointestinal: No reported hemesis, hematochezia, vomiting, or acute GI distress Musculoskeletal:  Midthoracic pain Neurological: No reported episodes of acute onset apraxia, aphasia, dysarthria, agnosia, amnesia, paralysis, loss of coordination, or loss of consciousness  Medication Review  LORazepam, cholecalciferol, gabapentin, hydrochlorothiazide, and traZODone  History Review  Allergy: Ms. Goers is allergic to amlodipine, fosamax [alendronate sodium], and penicillins. Drug: Ms. Montas  reports no history of drug use. Alcohol:  reports no history of alcohol use. Tobacco:  reports that she has never smoked. She has never used smokeless tobacco. Social: Ms. Perea  reports that she has never smoked. She has never used smokeless tobacco. She reports that she does not drink alcohol and does not use drugs. Medical:  has a past medical history of Hypertension, OSA (obstructive sleep apnea), and Osteopenia. Surgical: Ms. Turbin  has a past surgical history that includes Eye surgery (Bilateral); Tonsillectomy and adenoidectomy; Cataract extraction w/PHACO (Right, 06/07/2021); and Cataract extraction w/PHACO (Left, 06/21/2021). Family: family history includes Heart Problems in her brother and brother; Heart attack in her mother; Liver cancer in her son; Lung cancer in her brother.  Laboratory Chemistry Profile   Renal Lab Results  Component Value  Date   BUN 11 08/23/2021   CREATININE 0.96 08/23/2021   BCR 11 (L)  08/23/2021   GFRAA 74 09/11/2020   GFRNONAA 64 09/11/2020    Hepatic Lab Results  Component Value Date   AST 18 08/23/2021   ALT 9 08/23/2021   ALBUMIN 5.2 (H) 08/23/2021   ALKPHOS 68 08/23/2021    Electrolytes Lab Results  Component Value Date   NA 144 08/23/2021   K 5.1 08/23/2021   CL 104 08/23/2021   CALCIUM 10.6 (H) 08/23/2021    Bone Lab Results  Component Value Date   VD25OH 56.4 09/17/2018    Inflammation (CRP: Acute Phase) (ESR: Chronic Phase) No results found for: "CRP", "ESRSEDRATE", "LATICACIDVEN"       Note: Above Lab results reviewed.   CLINICAL DATA:  Thoracic pain.   EXAM: THORACIC SPINE 2 VIEWS   COMPARISON:  None Available.   FINDINGS: There is no evidence of thoracic spine fracture. Scoliosis of spine is noted. Degenerative joint changes of the lower thoracic upper lumbar spine noted. There is chronic compression deformity of a upper lumbar vertebral body.   IMPRESSION: No acute fracture or dislocation. Degenerative joint changes of spine. 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    Physical Exam  General appearance: Well nourished, well developed, and well hydrated. In no apparent acute distress Mental status: Alert, oriented x 3 (person, place, & time)       Respiratory: No evidence of acute respiratory distress Eyes: PERLA Vitals: BP (!) 160/66   Pulse 83   Temp 98 F (36.7 C)   Resp 18   Ht _0  (1.702 m)   Wt 120 lb (54.4 kg)   SpO2 99%   BMI 18.79 kg/m  BMI: Estimated body mass index is 18.79 kg/m as calculated from the following:   Height as of this encounter: _1  (1.702 m).   Weight as of this encounter: 120 lb (54.4 kg). Ideal: Ideal body weight: 61.6 kg (135 lb 12.9 oz)  Thoracic Spine Area Exam  Skin & Axial Inspection: No masses, redness, or swelling Alignment: Symmetrical Functional ROM: Unrestricted ROM Stability: No instability detected Muscle Tone/Strength: Functionally intact. No  obvious neuro-muscular anomalies detected. Sensory (Neurological): Musculoskeletal pain pattern, facet mediated Muscle strength & Tone: No palpable anomalies Lumbar Spine Area Exam  Skin & Axial Inspection: No masses, redness, or swelling Alignment: Symmetrical Functional ROM: Unrestricted ROM       Stability: No instability detected Muscle Tone/Strength: Functionally intact. No obvious neuro-muscular anomalies detected. Sensory (Neurological): Improved Palpation: No palpable anomalies       Provocative Tests: Hyperextension/rotation test: Improved after treatment       Lumbar quadrant test (Kemp's test): Improved after treatment        Gait & Posture Assessment  Ambulation: Unassisted Gait: Relatively normal for age and body habitus Posture: WNL  Lower Extremity Exam    Side: Right lower extremity  Side: Left lower extremity  Stability: No instability observed          Stability: No instability observed          Skin & Extremity Inspection: Skin color, temperature, and hair growth are WNL. No peripheral edema or cyanosis. No masses, redness, swelling, asymmetry, or associated skin lesions. No contractures.  Skin & Extremity Inspection: Skin color, temperature, and hair growth are WNL. No peripheral edema or cyanosis. No masses, redness, swelling, asymmetry, or associated skin lesions. No contractures.  Functional ROM: Unrestricted ROM  Functional ROM: Unrestricted ROM                  Muscle Tone/Strength: Functionally intact. No obvious neuro-muscular anomalies detected.  Muscle Tone/Strength: Functionally intact. No obvious neuro-muscular anomalies detected.  Sensory (Neurological): Unimpaired        Sensory (Neurological): Unimpaired        DTR: Patellar: deferred today Achilles: deferred today Plantar: deferred today  DTR: Patellar: deferred today Achilles: deferred today Plantar: deferred today  Palpation: No palpable anomalies  Palpation: No palpable  anomalies    Assessment   Diagnosis Status  1. Thoracic facet joint syndrome   2. Lumbar facet arthropathy   3. Lumbar spondylosis   4. Chronic pain syndrome   5. Lumbar degenerative disc disease   6. Thoracic degenerative disc disease    Deteriorating Controlled Controlled   Updated Problems: Problem  Thoracic Facet Joint Syndrome  Lumbar Spondylosis     Plan of Care  100% pain relief from diagnostic lumbar facet medial branch nerve blocks.  She states that her low back pain is no longer there and she can perform ADLs much better.  However she is having increased midthoracic pain related to thoracic facet arthropathy.  We discussed trialing thoracic facet medial branch nerve blocks in this region.  Risk and benefits reviewed and patient would like to proceed.   Orders:  Orders Placed This Encounter  Procedures   THORACIC FACET BLOCK    Standing Status:   Future    Standing Expiration Date:   11/09/2022    Scheduling Instructions:     Thoracic Medial Branch Block     Side: Bilateral     Sedation:without     Timeframe: ASAA    Order Specific Question:   Where will this procedure be performed?    Answer:   [facility]   Follow-up plan:   Return in about 3 weeks (around 08/31/2022) for Thoracic Fcts, in clinic [facility].     B/L L3,4,5 MBNB #1 07/13/22     Recent Visits Date Type Provider Dept  07/13/22 Procedure visit Gillis Santa, MD [facility]  06/30/22 Office Visit Gillis Santa, MD [facility]  Showing recent visits within past 90 days and meeting all other requirements Today's Visits Date Type Provider Dept  08/10/22 Office Visit Gillis Santa, MD [facility]  Showing today's visits and meeting all other requirements Future Appointments No visits were found meeting these conditions. Showing future appointments within next 90 days and meeting all other requirements  I discussed the assessment and treatment plan with the  patient. The patient was provided an opportunity to ask questions and all were answered. The patient agreed with the plan and demonstrated an understanding of the instructions.  Patient advised to call back or seek an in-person evaluation if the symptoms or condition worsens.  Duration of encounter: 77mnutes.  Total time on encounter, as per AMA guidelines included both the face-to-face and non-face-to-face time personally spent by the physician and/or other qualified health care professional(s) on the day of the encounter (includes time in activities that require the physician or other qualified health care professional and does not include time in activities normally performed by clinical staff). Physician's time may include the following activities when performed: preparing to see the patient (eg, review of tests, pre-charting review of records) obtaining and/or reviewing separately obtained history performing a medically appropriate examination and/or evaluation counseling and educating the patient/family/caregiver ordering medications, tests, or procedures referring and  communicating with other health care professionals (when not separately reported) documenting clinical information in the electronic or other health record independently interpreting results (not separately reported) and communicating results to the patient/ family/caregiver care coordination (not separately reported)  Note by: Gillis Santa, MD Date: 08/10/2022; Time: 4:06 PM

## 2022-08-29 ENCOUNTER — Ambulatory Visit
Admission: RE | Admit: 2022-08-29 | Discharge: 2022-08-29 | Disposition: A | Discharge status: Home / Self Care | Payer: Medicare Other | Source: Ambulatory Visit | Attending: Student in an Organized Health Care Education/Training Program | Admitting: Student in an Organized Health Care Education/Training Program

## 2022-08-29 ENCOUNTER — Other Ambulatory Visit: Payer: Self-pay

## 2022-08-29 ENCOUNTER — Ambulatory Visit
Payer: Medicare Other | Attending: Student in an Organized Health Care Education/Training Program | Admitting: Student in an Organized Health Care Education/Training Program

## 2022-08-29 ENCOUNTER — Encounter: Payer: Self-pay | Admitting: Student in an Organized Health Care Education/Training Program

## 2022-08-29 DIAGNOSIS — M5134 Other intervertebral disc degeneration, thoracic region: Secondary | ICD-10-CM | POA: Diagnosis not present

## 2022-08-29 DIAGNOSIS — M47894 Other spondylosis, thoracic region: Secondary | ICD-10-CM | POA: Insufficient documentation

## 2022-08-29 DIAGNOSIS — G894 Chronic pain syndrome: Secondary | ICD-10-CM | POA: Diagnosis not present

## 2022-08-29 MED ORDER — LIDOCAINE HCL 2 % IJ SOLN
20.0000 mL | Freq: Once | INTRAMUSCULAR | Status: AC
  Administered 2022-08-29: 400 mg
  Filled 2022-08-29: qty 40

## 2022-08-29 MED ORDER — ROPIVACAINE HCL 2 MG/ML IJ SOLN
9.0000 mL | Freq: Once | INTRAMUSCULAR | Status: AC
  Administered 2022-08-29: 9 mL via PERINEURAL

## 2022-08-29 MED ORDER — DEXAMETHASONE SODIUM PHOSPHATE 10 MG/ML IJ SOLN
10.0000 mg | Freq: Once | INTRAMUSCULAR | Status: AC
  Administered 2022-08-29: 10 mg
  Filled 2022-08-29: qty 1

## 2022-08-29 MED ORDER — ROPIVACAINE HCL 2 MG/ML IJ SOLN
9.0000 mL | Freq: Once | INTRAMUSCULAR | Status: AC
  Administered 2022-08-29: 9 mL via PERINEURAL
  Filled 2022-08-29: qty 20

## 2022-08-29 NOTE — Progress Notes (Signed)
Safety precautions to be maintained throughout the outpatient stay will include: orient to surroundings, keep bed in low position, maintain call bell within reach at all times, provide assistance with transfer out of bed and ambulation.  

## 2022-08-29 NOTE — Patient Instructions (Signed)
____________________________________________________________________________________________  Post-Procedure Discharge Instructions  Instructions: Apply ice:  Purpose: This will minimize any swelling and discomfort after procedure.  When: Day of procedure, as soon as you get home. How: Fill a plastic sandwich bag with crushed ice. Cover it with a small towel and apply to injection site. How long: (15 min on, 15 min off) Apply for 15 minutes then remove x 15 minutes.  Repeat sequence on day of procedure, until you go to bed. Apply heat:  Purpose: To treat any soreness and discomfort from the procedure. When: Starting the next day after the procedure. How: Apply heat to procedure site starting the day following the procedure. How long: May continue to repeat daily, until discomfort goes away. Food intake: Start with clear liquids (like water) and advance to regular food, as tolerated.  Physical activities: Keep activities to a minimum for the first 8 hours after the procedure. After that, then as tolerated. Driving: If you have received any sedation, be responsible and do not drive. You are not allowed to drive for 24 hours after having sedation. Blood thinner: (Applies only to those taking blood thinners) You may restart your blood thinner 6 hours after your procedure. Insulin: (Applies only to Diabetic patients taking insulin) As soon as you can eat, you may resume your normal dosing schedule. Infection prevention: Keep procedure site clean and dry. Shower daily and clean area with soap and water. Post-procedure Pain Diary: Extremely important that this be done correctly and accurately. Recorded information will be used to determine the next step in treatment. For the purpose of accuracy, follow these rules: Evaluate only the area treated. Do not report or include pain from an untreated area. For the purpose of this evaluation, ignore all other areas of pain, except for the treated area. After  your procedure, avoid taking a long nap and attempting to complete the pain diary after you wake up. Instead, set your alarm clock to go off every hour, on the hour, for the initial 8 hours after the procedure. Document the duration of the numbing medicine, and the relief you are getting from it. Do not go to sleep and attempt to complete it later. It will not be accurate. If you received sedation, it is likely that you were given a medication that may cause amnesia. Because of this, completing the diary at a later time may cause the information to be inaccurate. This information is needed to plan your care. Follow-up appointment: Keep your post-procedure follow-up evaluation appointment after the procedure (usually 2 weeks for most procedures, 6 weeks for radiofrequencies). DO NOT FORGET to bring you pain diary with you.   Expect: (What should I expect to see with my procedure?) From numbing medicine (AKA: Local Anesthetics): Numbness or decrease in pain. You may also experience some weakness, which if present, could last for the duration of the local anesthetic. Onset: Full effect within 15 minutes of injected. Duration: It will depend on the type of local anesthetic used. On the average, 1 to 8 hours.  From steroids (Applies only if steroids were used): Decrease in swelling or inflammation. Once inflammation is improved, relief of the pain will follow. Onset of benefits: Depends on the amount of swelling present. The more swelling, the longer it will take for the benefits to be seen. In some cases, up to 10 days. Duration: Steroids will stay in the system x 2 weeks. Duration of benefits will depend on multiple posibilities including persistent irritating factors. Side-effects: If present, they   may typically last 2 weeks (the duration of the steroids). Frequent: Cramps (if they occur, drink Gatorade and take over-the-counter Magnesium 450-500 mg once to twice a day); water retention with temporary  weight gain; increases in blood sugar; decreased immune system response; increased appetite. Occasional: Facial flushing (red, warm cheeks); mood swings; menstrual changes. Uncommon: Long-term decrease or suppression of natural hormones; bone thinning. (These are more common with higher doses or more frequent use. This is why we prefer that our patients avoid having any injection therapies in other practices.)  Very Rare: Severe mood changes; psychosis; aseptic necrosis. From procedure: Some discomfort is to be expected once the numbing medicine wears off. This should be minimal if ice and heat are applied as instructed.  Call if: (When should I call?) You experience numbness and weakness that gets worse with time, as opposed to wearing off. New onset bowel or bladder incontinence. (Applies only to procedures done in the spine)  Emergency Numbers: Durning business hours (Monday - Thursday, 8:00 AM - 4:00 PM) (Friday, 9:00 AM - 12:00 Noon): [phone] After hours: [phone] NOTE: If you are having a problem and are unable connect with, or to talk to a provider, then go to your nearest urgent care or emergency department. If the problem is serious and urgent, please call 911. ____________________________________________________________________________________________  Facet Blocks Patient Information  Description: The facets are joints in the spine between the vertebrae.  Like any joints in the body, facets can become irritated and painful.  Arthritis can also effect the facets.  By injecting steroids and local anesthetic in and around these joints, we can temporarily block the nerve supply to them.  Steroids act directly on irritated nerves and tissues to reduce selling and inflammation which often leads to decreased pain.  Facet blocks may be done anywhere along the spine from the neck to the low back depending upon the location of your pain.   After numbing the skin with local anesthetic  (like Novocaine), a small needle is passed onto the facet joints under x-ray guidance.  You may experience a sensation of pressure while this is being done.  The entire block usually lasts about 15-25 minutes.   Conditions which may be treated by facet blocks:  Low back/buttock pain Neck/shoulder pain Certain types of headaches  Preparation for the injection:  Do not eat any solid food or dairy products within 8 hours of your appointment. You may drink clear liquid up to 3 hours before appointment.  Clear liquids include water, black coffee, juice or soda.  No milk or cream please. You may take your regular medication, including pain medications, with a sip of water before your appointment.  Diabetics should hold regular insulin (if taken separately) and take 1/2 normal NPH dose the morning of the procedure.  Carry some sugar containing items with you to your appointment. A driver must accompany you and be prepared to drive you home after your procedure. Bring all your current medications with you. An IV may be inserted and sedation may be given at the discretion of the physician. A blood pressure cuff, EKG and other monitors will often be applied during the procedure.  Some patients may need to have extra oxygen administered for a short period. You will be asked to provide medical information, including your allergies and medications, prior to the procedure.  We must know immediately if you are taking blood thinners (like Coumadin/Warfarin) or if you are allergic to IV iodine contrast (dye).    We must know if you could possible be pregnant.  Possible side-effects:  Bleeding from needle site Infection (rare, may require surgery) Nerve injury (rare) Numbness & tingling (temporary) Difficulty urinating (rare, temporary) Spinal headache (a headache worse with upright posture) Light-headedness (temporary) Pain at injection site (serveral days) Decreased blood pressure (rare,  temporary) Weakness in arm/leg (temporary) Pressure sensation in back/neck (temporary)   Call if you experience:  Fever/chills associated with headache or increased back/neck pain Headache worsened by an upright position New onset, weakness or numbness of an extremity below the injection site Hives or difficulty breathing (go to the emergency room) Inflammation or drainage at the injection site(s) Severe back/neck pain greater than usual New symptoms which are concerning to you  Please note:  Although the local anesthetic injected can often make your back or neck feel good for several hours after the injection, the pain will likely return. It takes 3-7 days for steroids to work.  You may not notice any pain relief for at least one week.  If effective, we will often do a series of 2-3 injections spaced 3-6 weeks apart to maximally decrease your pain.  After the initial series, you may be a candidate for a more permanent nerve block of the facets.  If you have any questions, please call [phone] [facility] 

## 2022-08-29 NOTE — Progress Notes (Signed)
PROVIDER NOTE: Interpretation of information contained herein should be left to medically-trained personnel. Specific patient instructions are provided elsewhere under "Patient Instructions" section of medical record. This document was created in part using STT-dictation technology, any transcriptional errors that may result from this process are unintentional.  Patient: Mary Mosley Type: Established DOB: 09/30/1941 MRN: [id number] PCP: Birdie Sons, MD  Service: Procedure DOS: 08/29/2022 Setting: Ambulatory Location: Ambulatory outpatient facility Delivery: Face-to-face Provider: Gillis Santa, MD Specialty: Interventional Pain Management Specialty designation: 09 Location: Outpatient facility Ref. Prov.: Gillis Santa, MD    Primary Reason for Visit: Interventional Pain Management Treatment. CC: Back Pain (Mid back pain bilateral )   Procedure:            [Reference CPT: 64461(single); 64462(additional levels)]   Type: Thoracic facet medial branch block #1  Laterality: Bilateral (-50)  Level: T12 and L1 Medial Branch Level(s)  Imaging: Fluoroscopy-guided         Anesthesia: Local anesthesia (1-2% Lidocaine) DOS: 08/29/2022  Performed by: Gillis Santa, MD  Purpose: Diagnostic/Therapeutic Indications: Thoracic back pain severe enough to impact quality of life or function. Rationale (medical necessity): procedure needed and proper for the diagnosis and/or treatment of Mary Mosley's medical symptoms and needs. 1. Thoracic facet joint syndrome   2. Chronic pain syndrome   3. Thoracic degenerative disc disease    NAS-11 Pain score:   Pre-procedure: 2 /10   Post-procedure: 2 /10     Target: Thoracic posterior (dorsal) primary rami nerve Location: 2.5 cm lateral to midline (spinous process), just cephalad to upper transverse process edge.  (needle tip placement) Region: Thoracic  Approach: Paravertebral  Type of procedure: Percutaneous perineural nerve block   Pertinent  Anatomy: There are two potential fascial compartments in the TPVS: the anterior extrapleural paravertebral compartment and the posterior subendothoracic paravertebral compartment. The transverse process and the superior costotransverse ligament form the posterior boundary.  Position / Prep / Materials:  Position: Prone  Prep solution: DuraPrep (Iodine Povacrylex [0.7% available iodine] and Isopropyl Alcohol, 74% w/w) Prep Area: Entire upper back region Materials:  Tray: Block Needle(s):  Type: Spinal  Gauge (G): 25  Length: 3.5-in  Qty: 2  Pre-op H&P Assessment:  Mary Mosley is a 81 y.o. (year old), female patient, seen today for interventional treatment. She  has a past surgical history that includes Eye surgery (Bilateral); Tonsillectomy and adenoidectomy; Cataract extraction w/PHACO (Right, 06/07/2021); and Cataract extraction w/PHACO (Left, 06/21/2021). Mary Mosley has a current medication list which includes the following prescription(s): cholecalciferol, hydrochlorothiazide, trazodone, gabapentin, and lorazepam. Her primarily concern today is the Back Pain (Mid back pain bilateral )  Initial Vital Signs:  Pulse/HCG Rate: 86ECG Heart Rate: 80 Temp: (!) 97 F (36.1 C) Resp: 16 BP: (!) 158/89 SpO2: 100 %  BMI: Estimated body mass index is 18.79 kg/m as calculated from the following:   Height as of this encounter: 5\' 7"  (1.702 m).   Weight as of this encounter: 120 lb (54.4 kg).  Risk Assessment: Allergies: Reviewed. She is allergic to amlodipine, fosamax [alendronate sodium], and penicillins.  Allergy Precautions: None required Coagulopathies: Reviewed. None identified.  Blood-thinner therapy: None at this time Active Infection(s): Reviewed. None identified. Mary Mosley is afebrile  Site Confirmation: Mary Mosley was asked to confirm the procedure and laterality before marking the site Procedure checklist: Completed Consent: Before the procedure and under the influence of no  sedative(s), amnesic(s), or anxiolytics, the patient was informed of the treatment options, risks and possible complications. To fulfill  our ethical and legal obligations, as recommended by the American Medical Association's Code of Ethics, I have informed the patient of my clinical impression; the nature and purpose of the treatment or procedure; the risks, benefits, and possible complications of the intervention; the alternatives, including doing nothing; the risk(s) and benefit(s) of the alternative treatment(s) or procedure(s); and the risk(s) and benefit(s) of doing nothing. The patient was provided information about the general risks and possible complications associated with the procedure. These may include, but are not limited to: failure to achieve desired goals, infection, bleeding, organ or nerve damage, allergic reactions, paralysis, and death. In addition, the patient was informed of those risks and complications associated to Spine-related procedures, such as failure to decrease pain; infection (i.e.: Meningitis, epidural or intraspinal abscess); bleeding (i.e.: epidural hematoma, subarachnoid hemorrhage, or any other type of intraspinal or peri-dural bleeding); organ or nerve damage (i.e.: Any type of peripheral nerve, nerve root, or spinal cord injury) with subsequent damage to sensory, motor, and/or autonomic systems, resulting in permanent pain, numbness, and/or weakness of one or several areas of the body; allergic reactions; (i.e.: anaphylactic reaction); and/or death. Furthermore, the patient was informed of those risks and complications associated with the medications. These include, but are not limited to: allergic reactions (i.e.: anaphylactic or anaphylactoid reaction(s)); adrenal axis suppression; blood sugar elevation that in diabetics may result in ketoacidosis or comma; water retention that in patients with history of congestive heart failure may result in shortness of breath,  pulmonary edema, and decompensation with resultant heart failure; weight gain; swelling or edema; medication-induced neural toxicity; particulate matter embolism and blood vessel occlusion with resultant organ, and/or nervous system infarction; and/or aseptic necrosis of one or more joints. Finally, the patient was informed that Medicine is not an exact science; therefore, there is also the possibility of unforeseen or unpredictable risks and/or possible complications that may result in a catastrophic outcome. The patient indicated having understood very clearly. We have given the patient no guarantees and we have made no promises. Enough time was given to the patient to ask questions, all of which were answered to the patient's satisfaction. Mary Mosley has indicated that she wanted to continue with the procedure. Attestation: I, the ordering provider, attest that I have discussed with the patient the benefits, risks, side-effects, alternatives, likelihood of achieving goals, and potential problems during recovery for the procedure that I have provided informed consent. Date  Time: 08/29/2022  8:49 AM  Pre-Procedure Preparation:  Monitoring: As per clinic protocol. Respiration, ETCO2, SpO2, BP, heart rate and rhythm monitor placed and checked for adequate function Safety Precautions: Patient was assessed for positional comfort and pressure points before starting the procedure. Time-out: I initiated and conducted the "Time-out" before starting the procedure, as per protocol. The patient was asked to participate by confirming the accuracy of the "Time Out" information. Verification of the correct person, site, and procedure were performed and confirmed by me, the nursing staff, and the patient. "Time-out" conducted as per Joint Commission's Universal Protocol ([email or web address].01.01.01). Time: 0940  Description/Narrative of Procedure:        T12-L1 medial branch nerve block, bilateral  Technical description of  procedure:  Rationale (medical necessity): procedure needed and proper for the diagnosis and/or treatment of the patient's medical symptoms and needs. Procedural Technique Safety Precautions: Aspiration looking for blood return was conducted prior to all injections. At no point did we inject any substances, as a needle was being advanced. No attempts were made at seeking any paresthesias.  Safe injection practices and needle disposal techniques used. Medications properly checked for expiration dates. SDV (single dose vial) medications used. Target Area: For the thoracic medial branch nerve, the target is located between the top of the transverse processes at the point where the transverse process joints the vertebra and the superior-lateral aspect of the transverse process.  (More lateral towards the superior aspect of the thoracic spine.) For safety reasons and to minimize the risk of hemo- or pneumothorax, the needle tip was not advanced past the boundary of the posterior paravertebral compartment (superior costotransverse ligament). Description of the Procedure: Protocol guidelines were followed. The patient was placed in position over the fluoroscopy table. The target area was identified and the area prepped in the usual manner. Skin & deeper tissues infiltrated with local anesthetic. Appropriate amount of time allowed to pass for local anesthetics to take effect. The procedure needles were then advanced to the target area. Proper needle placement secured. Negative aspiration confirmed. Solution injected in intermittent fashion, asking for systemic symptoms every 0.5cc of injectate. The needles were then removed and the area cleansed, making sure to leave some of the prepping solution back to take advantage of its long term bactericidal properties.          Vitals:   08/29/22 0915 08/29/22 0939 08/29/22 0944 08/29/22 0947  BP: (!) 158/89 (!) 156/74 (!) 160/75 (!) 158/70  Pulse: 86     Resp: 16 15 16  18   Temp: (!) 97 F (36.1 C)     TempSrc: Temporal     SpO2: 100% 99% 98% 97%  Weight: 120 lb (54.4 kg)     Height: 5\' 7"  (1.702 m)       8 cc solution made of 6 cc of 0.2% ropivacaine, 2 cc of Decadron 10 mg/cc.  2 cc injected at each level above bilaterally.  Start Time: 0940 hrs. End Time: 0947 hrs.  Imaging Guidance:          Type of Imaging Technique: Fluoroscopy Guidance (Spinal) Indication(s): Assistance in needle guidance and placement for procedures requiring needle placement in or near specific anatomical locations not easily accessible without such assistance. Exposure Time: Please see nurses notes. Contrast: None used. Fluoroscopic Guidance: I was personally present during the use of fluoroscopy. "Tunnel Vision Technique" used to obtain the best possible view of the target area. Parallax error corrected before commencing the procedure. "Direction-depth-direction" technique used to introduce the needle under continuous pulsed fluoroscopy. Once target was reached, antero-posterior, oblique, and lateral fluoroscopic projection used confirm needle placement in all planes. Images permanently stored in EMR. Ultrasound Guidance: N/A Interpretation: I personally interpreted the imaging intraoperatively. Adequate needle placement confirmed in multiple planes. Appropriate spread of contrast into desired area was observed. No evidence of afferent or efferent intravascular uptake. No intrathecal or subarachnoid spread observed. Permanent images saved into the patient's record.  Post-operative Assessment:  Post-procedure Vital Signs:  Pulse/HCG Rate: 8680 Temp: (!) 97 F (36.1 C) Resp: 18 BP: (!) 158/70 SpO2: 97 %  EBL: None  Complications: No immediate post-treatment complications observed by team, or reported by patient.  Note: The patient tolerated the entire procedure well. A repeat set of vitals were taken after the procedure and the patient was kept under observation following  institutional policy, for this type of procedure. Post-procedural neurological assessment was performed, showing return to baseline, prior to discharge. The patient was provided with post-procedure discharge instructions, including a section on how to identify potential problems. Should any problems arise  concerning this procedure, the patient was given instructions to immediately contact us, at any time, without hesitation. In any case, we plan to contact the patient by telephone for a follow-up status report regarding this interventional procedure.  Comments:  No additional relevant information.  Plan of Care  Orders:  Orders Placed This Encounter  Procedures   DG PAIN CLIN[facility] C-ARM 1-60 MIN NO REPORT    Intraoperative interpretation by procedural physician at [place].    Standing Status:   Standing    Number of Occurrences:   1    Order Specific Question:   Reason for exam:    Answer:   Assistance in needle guidance and placement for procedures requiring needle placement in or near specific anatomical locations not easily accessible without such assistance.    Medications ordered for procedure: Meds ordered this encounter  Medications   lidocaine (XYLOCAINE) 2 % (with pres) injection 400 mg   dexamethasone (DECADRON) injection 10 mg   dexamethasone (DECADRON) injection 10 mg   ropivacaine (PF) 2 mg/mL (0.2%) (NAROPIN) injection 9 mL   ropivacaine (PF) 2 mg/mL (0.2%) (NAROPIN) injection 9 mL   Medications administered: We administered lidocaine, dexamethasone, dexamethasone, ropivacaine (PF) 2 mg/mL (0.2%), and ropivacaine (PF) 2 mg/mL (0.2%).  See the medical record for exact dosing, route, and time of administration.  Follow-up plan:   Return in about 5 weeks (around 10/03/2022) for Post Procedure Evaluation, in person.       B/L L3,4,5 MBNB #1 07/13/22, B/L T12/L1 08/29/22      Recent Visits Date Type Provider Dept  08/10/22 Office Visit Gillis Santa, MD  [facility]  07/13/22 Procedure visit Gillis Santa, MD [facility]  06/30/22 Office Visit Gillis Santa, MD [facility]  Showing recent visits within past 90 days and meeting all other requirements Today's Visits Date Type Provider Dept  08/29/22 Procedure visit Gillis Santa, MD [facility]  Showing today's visits and meeting all other requirements Future Appointments Date Type Provider Dept  10/05/22 Appointment Gillis Santa, MD [facility]  Showing future appointments within next 90 days and meeting all other requirements  Disposition: Discharge home  Discharge (Date  Time): 08/29/2022; 1000 hrs.   Primary Care Physician: Birdie Sons, MD Location: [facility] Note by: Gillis Santa, MD Date: 08/29/2022; Time: 9:58 AM  Disclaimer:  Medicine is not an exact science. The only guarantee in medicine is that nothing is guaranteed. It is important to note that the decision to proceed with this intervention was based on the information collected from the patient. The Data and conclusions were drawn from the patient's questionnaire, the interview, and the physical examination. Because the information was provided in large part by the patient, it cannot be guaranteed that it has not been purposely or unconsciously manipulated. Every effort has been made to obtain as much relevant data as possible for this evaluation. It is important to note that the conclusions that lead to this procedure are derived in large part from the available data. Always take into account that the treatment will also be dependent on availability of resources and existing treatment guidelines, considered by other Pain Management Practitioners as being common knowledge and practice, at the time of the intervention. For Medico-Legal purposes, it is also important to point out that variation in procedural techniques and pharmacological choices are  the acceptable norm. The indications, contraindications, technique, and results of the above procedure should only be interpreted and  judged by a Board-Certified Interventional Pain Specialist with extensive familiarity and expertise in the same exact procedure and technique.

## 2022-08-30 ENCOUNTER — Telehealth: Payer: Self-pay

## 2022-08-30 NOTE — Telephone Encounter (Signed)
Post procedure Phone call. Patient states she is doing good.

## 2022-09-13 ENCOUNTER — Encounter: Payer: Self-pay | Admitting: Family Medicine

## 2022-09-13 ENCOUNTER — Ambulatory Visit (INDEPENDENT_AMBULATORY_CARE_PROVIDER_SITE_OTHER): Payer: Medicare Other | Admitting: Family Medicine

## 2022-09-13 VITALS — BP 160/76 | HR 68 | Ht 67.0 in | Wt 124.5 lb

## 2022-09-13 DIAGNOSIS — Z0000 Encounter for general adult medical examination without abnormal findings: Secondary | ICD-10-CM | POA: Diagnosis not present

## 2022-09-13 DIAGNOSIS — Z23 Encounter for immunization: Secondary | ICD-10-CM | POA: Diagnosis not present

## 2022-09-13 DIAGNOSIS — E2839 Other primary ovarian failure: Secondary | ICD-10-CM

## 2022-09-13 DIAGNOSIS — M8580 Other specified disorders of bone density and structure, unspecified site: Secondary | ICD-10-CM | POA: Diagnosis not present

## 2022-09-13 DIAGNOSIS — M47816 Spondylosis without myelopathy or radiculopathy, lumbar region: Secondary | ICD-10-CM

## 2022-09-13 DIAGNOSIS — I10 Essential (primary) hypertension: Secondary | ICD-10-CM

## 2022-09-13 NOTE — Patient Instructions (Signed)
.   Please review the attached list of medications and notify my office if there are any errors.   . Please bring all of your medications to every appointment so we can make sure that our medication list is the same as yours.   

## 2022-09-13 NOTE — Progress Notes (Signed)
I,Mary Mosley,acting as a Neurosurgeon for Mila Merry, MD.,have documented all relevant documentation on the behalf of Mila Merry, MD,as directed by  Mila Merry, MD while in the presence of Mila Merry, MD.   Complete physical exam   Patient: Mary Mosley   DOB: June 29, 1941   81 y.o. Female  MRN: [id number] Visit Date: 09/13/2022  Today's healthcare provider: Mila Merry, MD   No chief complaint on file.  Subjective    Mary Mosley is a 81 y.o. female who presents today for a complete physical exam.  She reports consuming a general diet.  The patient report doing yard work daily as her form of exercise. Reports yard work takes at least a hour.  She generally feels well. She reports sleeping well. She does not have additional problems to discuss today.   She is due for follow up hypertension and reports that her home Bps are typically in the 130s 70s. Is doing well with current medications.   Past Medical History:  Diagnosis Date   Hypertension    OSA (obstructive sleep apnea)    pt denies   Osteopenia    Past Surgical History:  Procedure Laterality Date   CATARACT EXTRACTION W/PHACO Right 06/07/2021   Procedure: CATARACT EXTRACTION PHACO AND INTRAOCULAR LENS PLACEMENT (IOC) RIGHT;  Surgeon: Nevada Crane, MD;  Location: [facility];  Service: Ophthalmology;  Laterality: Right;  8.45 00:50.7   CATARACT EXTRACTION W/PHACO Left 06/21/2021   Procedure: CATARACT EXTRACTION PHACO AND INTRAOCULAR LENS PLACEMENT (IOC) LEFT 7.54 00:51.0;  Surgeon: Nevada Crane, MD;  Location: [facility];  Service: Ophthalmology;  Laterality: Left;   EYE SURGERY Bilateral    due to trauma   TONSILLECTOMY AND ADENOIDECTOMY     Social History   Socioeconomic History   Marital status: Widowed    Spouse name: Not on file   Number of children: 2   Years of education: Not on file   Highest education level: Some college, no degree  Occupational History    Occupation: Retired  Tobacco Use   Smoking status: Never   Smokeless tobacco: Never  Vaping Use   Vaping Use: Never used  Substance and Sexual Activity   Alcohol use: No    Alcohol/week: 0.0 standard drinks of alcohol   Drug use: No   Sexual activity: Not on file  Other Topics Concern   Not on file  Social History Narrative   Not on file   Social Determinants of Health   Financial Resource Strain: Low Risk  (12/28/2021)   Overall Financial Resource Strain (CARDIA)    Difficulty of Paying Living Expenses: Not hard at all  Food Insecurity: No Food Insecurity (12/28/2021)   Hunger Vital Sign    Worried About Running Out of Food in the Last Year: Never true    Ran Out of Food in the Last Year: Never true  Transportation Needs: No Transportation Needs (12/28/2021)   PRAPARE - Administrator, Civil Service (Medical): No    Lack of Transportation (Non-Medical): No  Physical Activity: Sufficiently Active (12/28/2021)   Exercise Vital Sign    Days of Exercise per Week: 6 days    Minutes of Exercise per Session: 60 min  Stress: No Stress Concern Present (12/28/2021)   Mary Mosley of Occupational Health - Occupational Stress Questionnaire    Feeling of Stress : Not at all  Social Connections: Moderately Isolated (12/28/2021)   Social Connection and Isolation Panel [NHANES]  Frequency of Communication with Friends and Family: More than three times a week    Frequency of Social Gatherings with Friends and Family: Once a week    Attends Religious Services: More than 4 times per year    Active Member of [employer] or Organizations: No    Attends Banker Meetings: Never    Marital Status: Widowed  Intimate Partner Violence: Not At Risk (12/28/2021)   Humiliation, Afraid, Rape, and Kick questionnaire    Fear of Current or Ex-Partner: No    Emotionally Abused: No    Physically Abused: No    Sexually Abused: No   Family Status  Relation Name Status   Mother  Deceased        MI   Brother  Deceased       Lung Cancer   Brother  Deceased   Brother  Deceased   Father  Deceased       Complications from traffic accident   Son  Alive   Son ADOPTED Deceased   Brother  Alive   Neg Hx  (Not Specified)   Family History  Problem Relation Age of Onset   Heart attack Mother    Lung cancer Brother    Heart Problems Brother    Heart Problems Brother        Has stents   Liver cancer Son    Breast cancer Neg Hx    Allergies  Allergen Reactions   Amlodipine Itching   Fosamax [Alendronate Sodium] Itching   Penicillins Hives    Patient Care Team: Malva Limes, MD as PCP - General (Family Medicine) Nevada Crane, MD as Consulting Physician (Ophthalmology)   Medications: Outpatient Medications Prior to Visit  Medication Sig Note   cholecalciferol (VITAMIN D) 1000 UNITS tablet Take 1 tablet by mouth daily.    hydrochlorothiazide (HYDRODIURIL) 12.5 MG tablet Take 1 tablet (12.5 mg total) by mouth daily.    LORazepam (ATIVAN) 1 MG tablet Take 0.5 tablets (0.5 mg total) by mouth at bedtime as needed.    traZODone (DESYREL) 50 MG tablet Take 1-2 tablets (50-100 mg total) by mouth at bedtime.    [DISCONTINUED] gabapentin (NEURONTIN) 100 MG capsule Take 1-2 capsules (100-200 mg total) by mouth at bedtime. Follow written titration schedule. (Patient not taking: Reported on 06/30/2022) 09/13/2022: dizziness and falls   No facility-administered medications prior to visit.    Review of Systems    Objective    BP (!) 160/76 (BP Location: Left Arm, Patient Position: Sitting, Cuff Size: Normal)   Pulse 68   Ht 5\' 7"  (1.702 m)   Wt 124 lb 8 oz (56.5 kg)   SpO2 100%   BMI 19.50 kg/m       Physical Exam   General Appearance:    Thin female. Alert, cooperative, in no acute distress, appears stated age   Head:    Normocephalic, without obvious abnormality, atraumatic  Eyes:    PERRL, conjunctiva/corneas clear, EOM's intact, fundi    benign, both eyes   Ears:    Normal TM's and external ear canals, both ears  Nose:   Nares normal, septum midline, mucosa normal, no drainage    or sinus tenderness  Throat:   Lips, mucosa, and tongue normal; teeth and gums normal  Neck:   Supple, symmetrical, trachea midline, no adenopathy;    thyroid:  no enlargement/tenderness/nodules; no carotid   bruit or JVD  Back:     Symmetric, no curvature, ROM normal, no CVA  tenderness  Lungs:     Clear to auscultation bilaterally, respirations unlabored  Chest Wall:    No tenderness or deformity   Heart:    Normal heart rate. Normal rhythm. No murmurs, rubs, or gallops.   Breast Exam:    deferred  Abdomen:     Soft, non-tender, bowel sounds active all four quadrants,    no masses, no organomegaly  Pelvic:    deferred  Extremities:   All extremities are intact. No cyanosis or edema  Pulses:   2+ and symmetric all extremities  Skin:   Skin color, texture, turgor normal, no rashes or lesions  Lymph nodes:   Cervical, supraclavicular, and axillary nodes normal  Neurologic:   CNII-XII intact, normal strength, sensation and reflexes    throughout     Last depression screening scores    08/10/2022    2:13 PM 06/30/2022   10:44 AM 04/25/2022    2:17 PM  PHQ 2/9 Scores  PHQ - 2 Score 0 0 0  PHQ- 9 Score   2   Last fall risk screening    08/10/2022    2:13 PM  Fall Risk   Falls in the past year? 0   Last Audit-C alcohol use screening    04/25/2022    2:17 PM  Alcohol Use Disorder Test (AUDIT)  1. How often do you have a drink containing alcohol? 0  2. How many drinks containing alcohol do you have on a typical day when you are drinking? 0  3. How often do you have six or more drinks on one occasion? 0  AUDIT-C Score 0   A score of 3 or more in women, and 4 or more in men indicates increased risk for alcohol abuse, EXCEPT if all of the points are from question 1   No results found for any visits on 09/13/22.  Assessment & Plan    Routine Health  Maintenance and Physical Exam  Exercise Activities and Dietary recommendations  Goals      DIET - EAT MORE FRUITS AND VEGETABLES     Increase water intake     Recommend increasing water intake to 6-8 glasses of water a day.          Immunization History  Administered Date(s) Administered   Fluad Quad(high Dose 65+) 09/11/2020, 08/20/2021   Influenza, High Dose Seasonal PF 09/07/2015, 09/15/2016, 07/21/2017, 07/24/2018, 09/16/2019   PFIZER(Purple Top)SARS-COV-2 Vaccination 09/22/2020, 10/13/2020   Pneumococcal Conjugate-13 09/04/2014   Pneumococcal Polysaccharide-23 01/08/2004   Tdap 04/13/2022   Zoster Recombinat (Shingrix) 03/14/2022, 06/17/2022    Health Maintenance  Topic Date Due   COVID-19 Vaccine (3 - Pfizer series) 12/08/2020   DEXA SCAN  10/29/2021   INFLUENZA VACCINE  06/21/2022   Medicare Annual Wellness (AWV)  01/27/2023   TETANUS/TDAP  04/13/2032   Pneumonia Vaccine 25+ Years old  Completed   Zoster Vaccines- Shingrix  Completed   HPV VACCINES  Aged Out    Discussed health benefits of physical activity, and encouraged her to engage in regular exercise appropriate for her age and condition.   2. Primary hypertension Home readings much better than today's office reading. Continue current medications.   - Lipid panel - Comprehensive metabolic panel - CBC with Differential/Platelet  3. Lumbar spondylosis Continue routine follow up Dr. Cherylann Ratel  4. Osteopenia, unspecified location Taking daily vitamin D supplements.   5. Estrogen deficiency  - DG Bone density [place]; Future  6. Need for influenza vaccination  - Flu Vaccine  QUAD High Dose IM (Fluad)     The entirety of the information documented in the History of Present Illness, Review of Systems and Physical Exam were personally obtained by me. Portions of this information were initially documented by the CMA and reviewed by me for thoroughness and accuracy.     Lelon Huh, MD  [facility] [phone] (phone) [phone] (fax)  [place]

## 2022-09-14 LAB — CBC WITH DIFFERENTIAL/PLATELET
Basophils Absolute: 0 10*3/uL (ref 0.0–0.2)
Basos: 1 %
EOS (ABSOLUTE): 0.1 10*3/uL (ref 0.0–0.4)
Eos: 2 %
Hematocrit: 42 % (ref 34.0–46.6)
Hemoglobin: 14.3 g/dL (ref 11.1–15.9)
Immature Grans (Abs): 0 10*3/uL (ref 0.0–0.1)
Immature Granulocytes: 0 %
Lymphocytes Absolute: 0.9 10*3/uL (ref 0.7–3.1)
Lymphs: 18 %
MCH: 31.6 pg (ref 26.6–33.0)
MCHC: 34 g/dL (ref 31.5–35.7)
MCV: 93 fL (ref 79–97)
Monocytes Absolute: 0.5 10*3/uL (ref 0.1–0.9)
Monocytes: 9 %
Neutrophils Absolute: 3.6 10*3/uL (ref 1.4–7.0)
Neutrophils: 70 %
Platelets: 196 10*3/uL (ref 150–450)
RBC: 4.52 x10E6/uL (ref 3.77–5.28)
RDW: 13.1 % (ref 11.7–15.4)
WBC: 5.1 10*3/uL (ref 3.4–10.8)

## 2022-09-14 LAB — COMPREHENSIVE METABOLIC PANEL
ALT: 14 IU/L (ref 0–32)
AST: 22 IU/L (ref 0–40)
Albumin/Globulin Ratio: 2.4 — ABNORMAL HIGH (ref 1.2–2.2)
Albumin: 5.2 g/dL — ABNORMAL HIGH (ref 3.7–4.7)
Alkaline Phosphatase: 61 IU/L (ref 44–121)
BUN/Creatinine Ratio: 15 (ref 12–28)
BUN: 11 mg/dL (ref 8–27)
Bilirubin Total: 0.4 mg/dL (ref 0.0–1.2)
CO2: 24 mmol/L (ref 20–29)
Calcium: 10.5 mg/dL — ABNORMAL HIGH (ref 8.7–10.3)
Chloride: 98 mmol/L (ref 96–106)
Creatinine, Ser: 0.73 mg/dL (ref 0.57–1.00)
Globulin, Total: 2.2 g/dL (ref 1.5–4.5)
Glucose: 94 mg/dL (ref 70–99)
Potassium: 4.4 mmol/L (ref 3.5–5.2)
Sodium: 139 mmol/L (ref 134–144)
Total Protein: 7.4 g/dL (ref 6.0–8.5)
eGFR: 82 mL/min/{1.73_m2} (ref >59)

## 2022-09-14 LAB — LIPID PANEL
Chol/HDL Ratio: 2.4 ratio (ref 0.0–4.4)
Cholesterol, Total: 211 mg/dL — ABNORMAL HIGH (ref 100–199)
HDL: 87 mg/dL (ref >39)
LDL Chol Calc (NIH): 111 mg/dL — ABNORMAL HIGH (ref 0–99)
Triglycerides: 75 mg/dL (ref 0–149)
VLDL Cholesterol Cal: 13 mg/dL (ref 5–40)

## 2022-10-05 ENCOUNTER — Encounter: Payer: Self-pay | Admitting: Student in an Organized Health Care Education/Training Program

## 2022-10-05 ENCOUNTER — Other Ambulatory Visit: Payer: Self-pay | Admitting: Family Medicine

## 2022-10-05 ENCOUNTER — Ambulatory Visit
Payer: Medicare Other | Attending: Student in an Organized Health Care Education/Training Program | Admitting: Student in an Organized Health Care Education/Training Program

## 2022-10-05 VITALS — BP 173/75 | HR 64 | Temp 97.3°F | Resp 16 | Ht 67.0 in | Wt 121.0 lb

## 2022-10-05 DIAGNOSIS — G894 Chronic pain syndrome: Secondary | ICD-10-CM | POA: Diagnosis not present

## 2022-10-05 DIAGNOSIS — M5134 Other intervertebral disc degeneration, thoracic region: Secondary | ICD-10-CM

## 2022-10-05 DIAGNOSIS — M47894 Other spondylosis, thoracic region: Secondary | ICD-10-CM | POA: Diagnosis not present

## 2022-10-05 DIAGNOSIS — I10 Essential (primary) hypertension: Secondary | ICD-10-CM

## 2022-10-05 NOTE — Progress Notes (Signed)
Safety precautions to be maintained throughout the outpatient stay will include: orient to surroundings, keep bed in low position, maintain call bell within reach at all times, provide assistance with transfer out of bed and ambulation.  

## 2022-10-05 NOTE — Telephone Encounter (Signed)
Pt just checking on her med, took last one this am.

## 2022-10-05 NOTE — Telephone Encounter (Signed)
Requested Prescriptions  Pending Prescriptions Disp Refills   hydrochlorothiazide (HYDRODIURIL) 12.5 MG tablet [Pharmacy Med Name: hydroCHLOROthiazide 12.5 MG Oral Tablet] 90 tablet 0    Sig: Take 1 tablet by mouth once daily for blood pressure     Cardiovascular: Diuretics - Thiazide Failed - 10/05/2022  4:10 PM      Failed - Last BP in normal range    BP Readings from Last 1 Encounters:  10/05/22 (!) 173/75         Passed - Cr in normal range and within 180 days    Creatinine, Ser  Date Value Ref Range Status  09/13/2022 0.73 0.57 - 1.00 mg/dL Final         Passed - K in normal range and within 180 days    Potassium  Date Value Ref Range Status  09/13/2022 4.4 3.5 - 5.2 mmol/L Final         Passed - Na in normal range and within 180 days    Sodium  Date Value Ref Range Status  09/13/2022 139 134 - 144 mmol/L Final         Passed - Valid encounter within last 6 months    Recent Outpatient Visits           3 weeks ago Annual physical exam   [facility] Malva Limes, MD   5 months ago Facial hematoma, subsequent encounter   [facility], Marzella Schlein, MD   7 months ago Primary hypertension   [facility] Malva Limes, MD   8 months ago Primary hypertension   [facility] Malva Limes, MD   1 year ago Primary hypertension   [facility], Demetrios Isaacs, MD       Future Appointments             In 4 months Fisher, Demetrios Isaacs, MD [facility]

## 2022-10-05 NOTE — Progress Notes (Signed)
PROVIDER NOTE: Information contained herein reflects review and annotations entered in association with encounter. Interpretation of such information and data should be left to medically-trained personnel. Information provided to patient can be located elsewhere in the medical record under "Patient Instructions". Document created using STT-dictation technology, any transcriptional errors that may result from process are unintentional.    Patient: Mary Mosley  Service Category: E/M  Provider: Gillis Santa, MD  DOB: 11-11-1941  DOS: 10/05/2022  Referring Provider: Birdie Sons, MD  MRN: [id number]  Specialty: Interventional Pain Management  PCP: Birdie Sons, MD  Type: Established Patient  Setting: Ambulatory outpatient    Location: Office  Delivery: Face-to-face     HPI  [name], a 81 y.o. 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 Ms. Fatzinger's primary complain today is Back Pain (Mid back pain ) Last encounter: My last encounter with her was on 08/29/2022. Pertinent problems: Ms. Dromgoole has Spinal stenosis, lumbar region, with neurogenic claudication; Lumbar degenerative disc disease; Lumbar spondylosis; Chronic bilateral thoracic back pain; Chronic pain syndrome; and Thoracic facet joint syndrome on their pertinent problem list. Pain Assessment: Severity of Chronic pain is reported as a 0-No pain/10. Location: Back Mid, Left/denies. Onset:  . Quality: Discomfort, Constant, Throbbing. Timing:  . Modifying factor(s): procedure helped a lot. Vitals:  height is _0  (1.702 m) and weight is 121 lb (54.9 kg). Her temporal temperature is 97.3 F (36.3 C) (abnormal). Her blood pressure is 173/75 (abnormal) and her pulse is 64. Her respiration is 16 and oxygen saturation is 100%.   Reason for encounter: post-procedure evaluation and assessment.    Post-procedure evaluation   Type: Thoracic facet medial branch block #1  Laterality: Bilateral  (-50)  Level: T12 and L1 Medial Branch Level(s)  Imaging: Fluoroscopy-guided         Anesthesia: Local anesthesia (1-2% Lidocaine) DOS: 08/29/2022  Performed by: Gillis Santa, MD  Purpose: Diagnostic/Therapeutic Indications: Thoracic back pain severe enough to impact quality of life or function. Rationale (medical necessity): procedure needed and proper for the diagnosis and/or treatment of Ms. Hackenberg's medical symptoms and needs. 1. Thoracic facet joint syndrome   2. Chronic pain syndrome   3. Thoracic degenerative disc disease    NAS-11 Pain score:   Pre-procedure: 2 /10   Post-procedure: 2 /10      Effectiveness:  Initial hour after procedure: 100 %  Subsequent 4-6 hours post-procedure: 100 %  Analgesia past initial 6 hours: 100 %  Ongoing improvement:  Analgesic:  100% Function: Ms. Rotolo reports improvement in function ROM: Ms. Eskenazi reports improvement in ROM    ROS  Constitutional: Denies any fever or chills Gastrointestinal: No reported hemesis, hematochezia, vomiting, or acute GI distress Musculoskeletal:  Improved thoracic mid back pain as well as range of motion Neurological: No reported episodes of acute onset apraxia, aphasia, dysarthria, agnosia, amnesia, paralysis, loss of coordination, or loss of consciousness  Medication Review  LORazepam, cholecalciferol, hydrochlorothiazide, and traZODone  History Review  Allergy: Ms. Kurihara is allergic to amlodipine, fosamax [alendronate sodium], gabapentin, and penicillins. Drug: Ms. Dattilio  reports no history of drug use. Alcohol:  reports no history of alcohol use. Tobacco:  reports that she has never smoked. She has never used smokeless tobacco. Social: Ms. Barsky  reports that she has never smoked. She has never used smokeless tobacco. She reports that she does not drink alcohol and does not use drugs. Medical:  has a past  medical history of Hypertension, OSA (obstructive sleep apnea), and Osteopenia. Surgical:  Ms. Bautch  has a past surgical history that includes Eye surgery (Bilateral); Tonsillectomy and adenoidectomy; Cataract extraction w/PHACO (Right, 06/07/2021); and Cataract extraction w/PHACO (Left, 06/21/2021). Family: family history includes Heart Problems in her brother and brother; Heart attack in her mother; Liver cancer in her son; Lung cancer in her brother.  Laboratory Chemistry Profile   Renal Lab Results  Component Value Date   BUN 11 09/13/2022   CREATININE 0.73 09/13/2022   BCR 15 09/13/2022   GFRAA 74 09/11/2020   GFRNONAA 64 09/11/2020    Hepatic Lab Results  Component Value Date   AST 22 09/13/2022   ALT 14 09/13/2022   ALBUMIN 5.2 (H) 09/13/2022   ALKPHOS 61 09/13/2022    Electrolytes Lab Results  Component Value Date   NA 139 09/13/2022   K 4.4 09/13/2022   CL 98 09/13/2022   CALCIUM 10.5 (H) 09/13/2022    Bone Lab Results  Component Value Date   VD25OH 56.4 09/17/2018    Inflammation (CRP: Acute Phase) (ESR: Chronic Phase) No results found for: "CRP", "ESRSEDRATE", "LATICACIDVEN"       Note: Above Lab results reviewed.  Recent Imaging Review  DG PAIN CLINIC C-ARM 1-60 MIN NO REPORT Fluoro was used, but no Radiologist interpretation will be provided.  Please refer to "NOTES" tab for provider progress note. Note: Reviewed        Physical Exam  General appearance: Well nourished, well developed, and well hydrated. In no apparent acute distress Mental status: Alert, oriented x 3 (person, place, & time)       Respiratory: No evidence of acute respiratory distress Eyes: PERLA Vitals: BP (!) 173/75 (BP Location: Left Arm, Patient Position: Sitting, Cuff Size: Normal)   Pulse 64   Temp (!) 97.3 F (36.3 C) (Temporal)   Resp 16   Ht _0  (1.702 m)   Wt 121 lb (54.9 kg)   SpO2 100%   BMI 18.95 kg/m  BMI: Estimated body mass index is 18.95 kg/m as calculated from the following:   Height as of this encounter: _1  (1.702 m).   Weight as of this  encounter: 121 lb (54.9 kg). Ideal: Ideal body weight: 61.6 kg (135 lb 12.9 oz)  Improved thoracic mid back pain, improved range of motion.  Assessment   Diagnosis Status  1. Thoracic facet joint syndrome   2. Thoracic degenerative disc disease   3. Chronic pain syndrome    Controlled Controlled Controlled   Updated Problems: No problems updated.  Plan of Care  Excellent response to previous lumbar medial branch nerve blocks.  Thoracic medial branch nerve block.  Reports 100% pain relief that is ongoing for her mid and low back.  Encouraged her to continue to monitor symptoms follow-up as needed if she would like to repeat spinal injections.  Follow-up plan:   Return if symptoms worsen or fail to improve.     B/L L3,4,5 MBNB #1 07/13/22, B/L T12/L1 08/29/22       Recent Visits Date Type Provider Dept  08/29/22 Procedure visit Gillis Santa, MD [facility]  08/10/22 Office Visit Gillis Santa, MD [facility]  07/13/22 Procedure visit Gillis Santa, MD [facility]  Showing recent visits within past 90 days and meeting all other requirements Today's Visits Date Type Provider Dept  10/05/22 Office Visit Gillis Santa, MD [facility]  Showing today's visits and meeting all other requirements Future  Appointments No visits were found meeting these conditions. Showing future appointments within next 90 days and meeting all other requirements  I discussed the assessment and treatment plan with the patient. The patient was provided an opportunity to ask questions and all were answered. The patient agreed with the plan and demonstrated an understanding of the instructions.  Patient advised to call back or seek an in-person evaluation if the symptoms or condition worsens.  Duration of encounter: 72mnutes.  Total time on encounter, as per AMA guidelines included both the face-to-face and non-face-to-face time personally spent by the physician  and/or other qualified health care professional(s) on the day of the encounter (includes time in activities that require the physician or other qualified health care professional and does not include time in activities normally performed by clinical staff). Physician's time may include the following activities when performed: preparing to see the patient (eg, review of tests, pre-charting review of records) obtaining and/or reviewing separately obtained history performing a medically appropriate examination and/or evaluation counseling and educating the patient/family/caregiver ordering medications, tests, or procedures referring and communicating with other health care professionals (when not separately reported) documenting clinical information in the electronic or other health record independently interpreting results (not separately reported) and communicating results to the patient/ family/caregiver care coordination (not separately reported)  Note by: BGillis Santa MD Date: 10/05/2022; Time: 3:02 PM

## 2022-11-10 ENCOUNTER — Other Ambulatory Visit: Payer: Self-pay | Admitting: Family Medicine

## 2022-11-10 DIAGNOSIS — G4700 Insomnia, unspecified: Secondary | ICD-10-CM

## 2022-11-10 MED ORDER — TRAZODONE HCL 50 MG PO TABS: 50.0000 mg | ORAL_TABLET | Freq: Every day | ORAL | 1 refills | Status: DC

## 2022-11-10 NOTE — Telephone Encounter (Signed)
Requested Prescriptions  Pending Prescriptions Disp Refills   traZODone (DESYREL) 50 MG tablet 90 tablet 1    Sig: Take 1-2 tablets (50-100 mg total) by mouth at bedtime.     Psychiatry: Antidepressants - Serotonin Modulator Passed - 11/10/2022  9:18 AM      Passed - Valid encounter within last 6 months    Recent Outpatient Visits           1 month ago Annual physical exam   [facility] Malva Limes, MD   6 months ago Facial hematoma, subsequent encounter   [facility] [place], Marzella Schlein, MD   8 months ago Primary hypertension   [facility] Malva Limes, MD   9 months ago Primary hypertension   [facility] Malva Limes, MD   1 year ago Primary hypertension   [facility] Malva Limes, MD       Future Appointments             In 2 months Fisher, Demetrios Isaacs, MD [facility]

## 2022-11-10 NOTE — Telephone Encounter (Signed)
Medication Refill - Medication: traZODone (DESYREL) 50 MG tablet   Has the patient contacted their pharmacy? Yes.   Pt told to contact provider  Preferred Pharmacy (with phone number or street name):  [facility] [place], [place] - [address] Phone: [phone]  Fax: [phone]     Has the patient been seen for an appointment in the last year OR does the patient have an upcoming appointment? Yes.    Agent: Please be advised that RX refills may take up to 3 business days. We ask that you follow-up with your pharmacy.

## 2022-12-01 DIAGNOSIS — H0289 Other specified disorders of eyelid: Secondary | ICD-10-CM | POA: Diagnosis not present

## 2022-12-01 DIAGNOSIS — H00011 Hordeolum externum right upper eyelid: Secondary | ICD-10-CM | POA: Diagnosis not present

## 2022-12-01 DIAGNOSIS — H00012 Hordeolum externum right lower eyelid: Secondary | ICD-10-CM | POA: Diagnosis not present

## 2023-01-02 ENCOUNTER — Ambulatory Visit (INDEPENDENT_AMBULATORY_CARE_PROVIDER_SITE_OTHER): Payer: Medicare Other

## 2023-01-02 VITALS — Ht 66.0 in | Wt 121.0 lb

## 2023-01-02 DIAGNOSIS — Z0000 Encounter for general adult medical examination without abnormal findings: Secondary | ICD-10-CM

## 2023-01-02 NOTE — Progress Notes (Signed)
I connected with  [name] on 01/02/23 by a audio enabled telemedicine application and verified that I am speaking with the correct person using two identifiers.  Patient Location: Home  Provider Location: Home Office  I discussed the limitations of evaluation and management by telemedicine. The patient expressed understanding and agreed to proceed.  Subjective:   YARESLI DOBIAS is a 82 y.o. female who presents for Medicare Annual (Subsequent) preventive examination.  Review of Systems          Objective:    Today's Vitals   01/02/23 0919  Weight: 121 lb (54.9 kg)  Height: 5' 6"$  (1.676 m)   Body mass index is 19.53 kg/m.     01/02/2023    9:25 AM 08/10/2022    2:13 PM 06/30/2022   10:44 AM 04/13/2022    5:31 PM 12/28/2021    9:10 AM 06/21/2021    7:41 AM 06/07/2021   10:29 AM  Advanced Directives  Does Patient Have a Medical Advance Directive? Yes Yes No No Yes Yes Yes  Type of Advance Directive Living will;Healthcare Power of [place];Living will   Healthcare Power of [place];Living will Living will;Healthcare Power of Attorney  Does patient want to make changes to medical advance directive?     Yes (Inpatient - patient defers changing a medical advance directive and declines information at this time) No - Patient declined No - Patient declined  Copy of [place] in Chart?     Yes - validated most recent copy scanned in chart (See row information) No - copy requested No - copy requested  Would patient like information on creating a medical advance directive?   No - Patient declined No - Patient declined       Current Medications (verified) Outpatient Encounter Medications as of 01/02/2023  Medication Sig   cholecalciferol (VITAMIN D) 1000 UNITS tablet Take 1 tablet by mouth daily.   hydrochlorothiazide (HYDRODIURIL) 12.5 MG tablet Take 1 tablet by mouth once daily for blood pressure   traZODone  (DESYREL) 50 MG tablet Take 1-2 tablets (50-100 mg total) by mouth at bedtime.   LORazepam (ATIVAN) 1 MG tablet Take 0.5 tablets (0.5 mg total) by mouth at bedtime as needed. (Patient not taking: Reported on 10/05/2022)   No facility-administered encounter medications on file as of 01/02/2023.    Allergies (verified) Amlodipine, Fosamax [alendronate sodium], Gabapentin, and Penicillins   History: Past Medical History:  Diagnosis Date   Hypertension    OSA (obstructive sleep apnea)    pt denies   Osteopenia    Past Surgical History:  Procedure Laterality Date   CATARACT EXTRACTION W/PHACO Right 06/07/2021   Procedure: CATARACT EXTRACTION PHACO AND INTRAOCULAR LENS PLACEMENT ([place]) RIGHT;  Surgeon: Eulogio Bear, MD;  Location: [facility];  Service: Ophthalmology;  Laterality: Right;  8.45 00:50.7   CATARACT EXTRACTION W/PHACO Left 06/21/2021   Procedure: CATARACT EXTRACTION PHACO AND INTRAOCULAR LENS PLACEMENT ([facility]) LEFT 7.54 00:51.0;  Surgeon: Eulogio Bear, MD;  Location: [place];  Service: Ophthalmology;  Laterality: Left;   EYE SURGERY Bilateral    due to trauma   TONSILLECTOMY AND ADENOIDECTOMY     Family History  Problem Relation Age of Onset   Heart attack Mother    Lung cancer Brother    Heart Problems Brother    Heart Problems Brother        Has stents   Liver cancer Son  Breast cancer Neg Hx    Social History   Socioeconomic History   Marital status: Widowed    Spouse name: Not on file   Number of children: 2   Years of education: Not on file   Highest education level: Some college, no degree  Occupational History   Occupation: Retired  Tobacco Use   Smoking status: Never   Smokeless tobacco: Never  Vaping Use   Vaping Use: Never used  Substance and Sexual Activity   Alcohol use: No    Alcohol/week: 0.0 standard drinks of alcohol   Drug use: No   Sexual activity: Not on file  Other Topics Concern   Not on file  Social  History Narrative   Not on file   Social Determinants of Health   Financial Resource Strain: Low Risk  (12/31/2022)   Overall Financial Resource Strain (CARDIA)    Difficulty of Paying Living Expenses: Not hard at all  Food Insecurity: No Food Insecurity (12/31/2022)   Hunger Vital Sign    Worried About Running Out of Food in the Last Year: Never true    [place] in the Last Year: Never true  Transportation Needs: No Transportation Needs (12/31/2022)   PRAPARE - Hydrologist (Medical): No    Lack of Transportation (Non-Medical): No  Physical Activity: Sufficiently Active (12/31/2022)   Exercise Vital Sign    Days of Exercise per Week: 6 days    Minutes of Exercise per Session: 30 min  Stress: No Stress Concern Present (12/31/2022)   [place]    Feeling of Stress : Only a little  Social Connections: Unknown (12/31/2022)   Social Connection and Isolation Panel [NHANES]    Frequency of Communication with Friends and Family: More than three times a week    Frequency of Social Gatherings with Friends and Family: Once a week    Attends Religious Services: Not on Advertising copywriter or Organizations: Yes    Attends Archivist Meetings: More than 4 times per year    Marital Status: Widowed    Tobacco Counseling Counseling given: Not Answered   Clinical Intake:              How often do you need to have someone help you when you read instructions, pamphlets, or other written materials from your doctor or pharmacy?: 1 - Never  Diabetic?no       Activities of Daily Living    12/31/2022   12:05 PM 04/25/2022    2:17 PM  In your present state of health, do you have any difficulty performing the following activities:  Hearing? 0 0  Vision? 0 0  Difficulty concentrating or making decisions? 0 0  Walking or climbing stairs? 0 0  Dressing or bathing? 0 0   Doing errands, shopping? 0 0  Preparing Food and eating ? N   Using the Toilet? N   In the past six months, have you accidently leaked urine? N   Do you have problems with loss of bowel control? N   Managing your Medications? N   Managing your Finances? N   Housekeeping or managing your Housekeeping? N     Patient Care Team: Birdie Sons, MD as PCP - General (Family Medicine) Eulogio Bear, MD as Consulting Physician (Ophthalmology)  Indicate any recent Medical Services you may have received from other than [facility] providers  in the past year (date may be approximate).     Assessment:   This is a routine wellness examination for Zakariah.  Hearing/Vision screen Hearing Screening - Comments:: Adequate hearing Vision Screening - Comments:: Adequate vision/readers only. Dr Edison Pace  Dietary issues and exercise activities discussed:     Goals Addressed             This Visit's Progress    DIET - EAT MORE FRUITS AND VEGETABLES   On track    Increase water intake   Not on track    Recommend increasing water intake to 6-8 glasses of water a day.        Depression Screen    01/02/2023    9:23 AM 09/13/2022    9:12 AM 08/10/2022    2:13 PM 06/30/2022   10:44 AM 04/25/2022    2:17 PM 12/28/2021    9:09 AM 02/09/2021   10:26 AM  PHQ 2/9 Scores  PHQ - 2 Score 0 0 0 0 0 0 0  PHQ- 9 Score  0   2  0    Fall Risk    12/31/2022   12:05 PM 10/05/2022    1:49 PM 09/13/2022    9:12 AM 08/10/2022    2:13 PM 07/13/2022    9:29 AM  Fall Risk   Falls in the past year? 0 0 1 0 0  Number falls in past yr: 0 0 0  0  Injury with Fall? 1  1    Risk for fall due to :  No Fall Risks History of fall(s)  No Fall Risks  Follow up  Falls evaluation completed Falls evaluation completed  Falls evaluation completed    [place]:  Any stairs in or around the home? Yes  If so, are there any without handrails? Yes  Home free of loose throw rugs in walkways,  pet beds, electrical cords, etc? Yes  Adequate lighting in your home to reduce risk of falls? Yes   ASSISTIVE DEVICES UTILIZED TO PREVENT FALLS:  Life alert? No  Use of a cane, walker or w/c? No  Grab bars in the bathroom? Yes  Shower chair or bench in shower? No  Elevated toilet seat or a handicapped toilet? Yes    Cognitive Function:    09/11/2020    3:35 PM  MMSE - Mini Mental State Exam  Orientation to time 5  Orientation to Place 5  Registration 3  Attention/ Calculation 5  Recall 3  Language- name 2 objects 2  Language- repeat 1  Language- follow 3 step command 3  Language- read & follow direction 1  Write a sentence 1  Copy design 1  Total score 30        01/02/2023    9:26 AM 07/30/2019    9:25 AM 07/21/2017    1:11 PM  6CIT Screen  What Year? 0 points 0 points 0 points  What month? 0 points 0 points 0 points  What time? 0 points 0 points 0 points  Count back from 20 0 points 0 points 0 points  Months in reverse 0 points 0 points 0 points  Repeat phrase 0 points 0 points 4 points  Total Score 0 points 0 points 4 points    Immunizations Immunization History  Administered Date(s) Administered   Fluad Quad(high Dose 65+) 09/11/2020, 08/20/2021, 09/13/2022   Influenza, High Dose Seasonal PF 09/07/2015, 09/15/2016, 07/21/2017, 07/24/2018, 09/16/2019   PFIZER(Purple Top)SARS-COV-2 Vaccination  09/22/2020, 10/13/2020   Pneumococcal Conjugate-13 09/04/2014   Pneumococcal Polysaccharide-23 01/08/2004   Tdap 04/13/2022   Zoster Recombinat (Shingrix) 03/14/2022, 06/17/2022    TDAP status: Up to date  Flu Vaccine status: Up to date  Pneumococcal vaccine status: Up to date  Covid-19 vaccine status: Completed vaccines  Qualifies for Shingles Vaccine? Yes   Zostavax completed yes Shingrix Completed?: Yes  Screening Tests Health Maintenance  Topic Date Due   DEXA SCAN  10/29/2021   COVID-19 Vaccine (3 - 2023-24 season) 07/22/2022   Medicare Annual  Wellness (AWV)  01/03/2024   DTaP/Tdap/Td (2 - Td or Tdap) 04/13/2032   Pneumonia Vaccine 39+ Years old  Completed   INFLUENZA VACCINE  Completed   Zoster Vaccines- Shingrix  Completed   HPV VACCINES  Aged Out    Health Maintenance  Health Maintenance Due  Topic Date Due   DEXA SCAN  10/29/2021   COVID-19 Vaccine (3 - 2023-24 season) 07/22/2022    Colorectal cancer screening: No longer required.   Mammogram status: No longer required due to age.  Bone Density status: Completed yes. Results reflect: Bone density results: NORMAL. Repeat every 5 years.age out  Lung Cancer Screening: (Low Dose CT Chest recommended if Age 44-80 years, 30 pack-year currently smoking OR have quit w/in 15years.) does not qualify.   Lung Cancer Screening Referral: no  Additional Screening:  Hepatitis C Screening: does not qualify; Completed no  Vision Screening: Recommended annual ophthalmology exams for early detection of glaucoma and other disorders of the eye. Is the patient up to date with their annual eye exam?  Yes  Who is the provider or what is the name of the office in which the patient attends annual eye exams? Dr Edison Pace If pt is not established with a provider, would they like to be referred to a provider to establish care? No .   Dental Screening: Recommended annual dental exams for proper oral hygiene  Community Resource Referral / Chronic Care Management: CRR required this visit?  No   CCM required this visit?  No      Plan:     I have personally reviewed and noted the following in the patient's chart:   Medical and social history Use of alcohol, tobacco or illicit drugs  Current medications and supplements including opioid prescriptions. Patient is not currently taking opioid prescriptions. Functional ability and status Nutritional status Physical activity Advanced directives List of other physicians Hospitalizations, surgeries, and ER visits in previous 12  months Vitals Screenings to include cognitive, depression, and falls Referrals and appointments  In addition, I have reviewed and discussed with patient certain preventive protocols, quality metrics, and best practice recommendations. A written personalized care plan for preventive services as well as general preventive health recommendations were provided to patient.     Roger Shelter, LPN   [id number]   Nurse Notes: pt states she is doing well. She has no concerns or questions, only needs a refill of HCTZ.

## 2023-01-02 NOTE — Patient Instructions (Signed)
Mary Mosley , Thank you for taking time to come for your Medicare Wellness Visit. I appreciate your ongoing commitment to your health goals. Please review the following plan we discussed and let me know if I can assist you in the future.   These are the goals we discussed:  Goals      DIET - EAT MORE FRUITS AND VEGETABLES     Increase water intake     Recommend increasing water intake to 6-8 glasses of water a day.         This is a list of the screening recommended for you and due dates:  Health Maintenance  Topic Date Due   DEXA scan (bone density measurement)  10/29/2021   COVID-19 Vaccine (3 - 2023-24 season) 07/22/2022   Medicare Annual Wellness Visit  01/03/2024   DTaP/Tdap/Td vaccine (2 - Td or Tdap) 04/13/2032   Pneumonia Vaccine  Completed   Flu Shot  Completed   Zoster (Shingles) Vaccine  Completed   HPV Vaccine  Aged Out    Advanced directives: yes   Conditions/risks identified: none  Next appointment: Follow up in one year for your annual wellness visit 01/03/2024@0945$  telephone   Preventive Care 65 Years and Older, Female Preventive care refers to lifestyle choices and visits with your health care provider that can promote health and wellness. What does preventive care include? A yearly physical exam. This is also called an annual well check. Dental exams once or twice a year. Routine eye exams. Ask your health care provider how often you should have your eyes checked. Personal lifestyle choices, including: Daily care of your teeth and gums. Regular physical activity. Eating a healthy diet. Avoiding tobacco and drug use. Limiting alcohol use. Practicing safe sex. Taking low-dose aspirin every day. Taking vitamin and mineral supplements as recommended by your health care provider. What happens during an annual well check? The services and screenings done by your health care provider during your annual well check will depend on your age, overall health,  lifestyle risk factors, and family history of disease. Counseling  Your health care provider may ask you questions about your: Alcohol use. Tobacco use. Drug use. Emotional well-being. Home and relationship well-being. Sexual activity. Eating habits. History of falls. Memory and ability to understand (cognition). Work and work Statistician. Reproductive health. Screening  You may have the following tests or measurements: Height, weight, and BMI. Blood pressure. Lipid and cholesterol levels. These may be checked every 5 years, or more frequently if you are over 53 years old. Skin check. Lung cancer screening. You may have this screening every year starting at age 30 if you have a 30-pack-year history of smoking and currently smoke or have quit within the past 15 years. Fecal occult blood test (FOBT) of the stool. You may have this test every year starting at age 54. Flexible sigmoidoscopy or colonoscopy. You may have a sigmoidoscopy every 5 years or a colonoscopy every 10 years starting at age 28. Hepatitis C blood test. Hepatitis B blood test. Sexually transmitted disease (STD) testing. Diabetes screening. This is done by checking your blood sugar (glucose) after you have not eaten for a while (fasting). You may have this done every 1-3 years. Bone density scan. This is done to screen for osteoporosis. You may have this done starting at age 74. Mammogram. This may be done every 1-2 years. Talk to your health care provider about how often you should have regular mammograms. Talk with your health care provider about  your test results, treatment options, and if necessary, the need for more tests. Vaccines  Your health care provider may recommend certain vaccines, such as: Influenza vaccine. This is recommended every year. Tetanus, diphtheria, and acellular pertussis (Tdap, Td) vaccine. You may need a Td booster every 10 years. Zoster vaccine. You may need this after age  61. Pneumococcal 13-valent conjugate (PCV13) vaccine. One dose is recommended after age 36. Pneumococcal polysaccharide (PPSV23) vaccine. One dose is recommended after age 43. Talk to your health care provider about which screenings and vaccines you need and how often you need them. This information is not intended to replace advice given to you by your health care provider. Make sure you discuss any questions you have with your health care provider. Document Released: 12/04/2015 Document Revised: 07/27/2016 Document Reviewed: 09/08/2015 Elsevier Interactive Patient Education  2017 [employer] Prevention in the Home Falls can cause injuries. They can happen to people of all ages. There are many things you can do to make your home safe and to help prevent falls. What can I do on the outside of my home? Regularly fix the edges of walkways and driveways and fix any cracks. Remove anything that might make you trip as you walk through a door, such as a raised step or threshold. Trim any bushes or trees on the path to your home. Use bright outdoor lighting. Clear any walking paths of anything that might make someone trip, such as rocks or tools. Regularly check to see if handrails are loose or broken. Make sure that both sides of any steps have handrails. Any raised decks and porches should have guardrails on the edges. Have any leaves, snow, or ice cleared regularly. Use sand or salt on walking paths during winter. Clean up any spills in your garage right away. This includes oil or grease spills. What can I do in the bathroom? Use night lights. Install grab bars by the toilet and in the tub and shower. Do not use towel bars as grab bars. Use non-skid mats or decals in the tub or shower. If you need to sit down in the shower, use a plastic, non-slip stool. Keep the floor dry. Clean up any water that spills on the floor as soon as it happens. Remove soap buildup in the tub or shower  regularly. Attach bath mats securely with double-sided non-slip rug tape. Do not have throw rugs and other things on the floor that can make you trip. What can I do in the bedroom? Use night lights. Make sure that you have a light by your bed that is easy to reach. Do not use any sheets or blankets that are too big for your bed. They should not hang down onto the floor. Have a firm chair that has side arms. You can use this for support while you get dressed. Do not have throw rugs and other things on the floor that can make you trip. What can I do in the kitchen? Clean up any spills right away. Avoid walking on wet floors. Keep items that you use a lot in easy-to-reach places. If you need to reach something above you, use a strong step stool that has a grab bar. Keep electrical cords out of the way. Do not use floor polish or wax that makes floors slippery. If you must use wax, use non-skid floor wax. Do not have throw rugs and other things on the floor that can make you trip. What can I do with  my stairs? Do not leave any items on the stairs. Make sure that there are handrails on both sides of the stairs and use them. Fix handrails that are broken or loose. Make sure that handrails are as long as the stairways. Check any carpeting to make sure that it is firmly attached to the stairs. Fix any carpet that is loose or worn. Avoid having throw rugs at the top or bottom of the stairs. If you do have throw rugs, attach them to the floor with carpet tape. Make sure that you have a light switch at the top of the stairs and the bottom of the stairs. If you do not have them, ask someone to add them for you. What else can I do to help prevent falls? Wear shoes that: Do not have high heels. Have rubber bottoms. Are comfortable and fit you well. Are closed at the toe. Do not wear sandals. If you use a stepladder: Make sure that it is fully opened. Do not climb a closed stepladder. Make sure that  both sides of the stepladder are locked into place. Ask someone to hold it for you, if possible. Clearly mark and make sure that you can see: Any grab bars or handrails. First and last steps. Where the edge of each step is. Use tools that help you move around (mobility aids) if they are needed. These include: Canes. Walkers. Scooters. Crutches. Turn on the lights when you go into a dark area. Replace any light bulbs as soon as they burn out. Set up your furniture so you have a clear path. Avoid moving your furniture around. If any of your floors are uneven, fix them. If there are any pets around you, be aware of where they are. Review your medicines with your doctor. Some medicines can make you feel dizzy. This can increase your chance of falling. Ask your doctor what other things that you can do to help prevent falls. This information is not intended to replace advice given to you by your health care provider. Make sure you discuss any questions you have with your health care provider. Document Released: 09/03/2009 Document Revised: 04/14/2016 Document Reviewed: 12/12/2014 Elsevier Interactive Patient Education  2017 [employer].

## 2023-01-04 ENCOUNTER — Other Ambulatory Visit: Payer: Self-pay | Admitting: Family Medicine

## 2023-01-04 DIAGNOSIS — I10 Essential (primary) hypertension: Secondary | ICD-10-CM

## 2023-01-04 NOTE — Telephone Encounter (Signed)
Pt is completely out of her current supply, the pharmacy has given her 3 days worth. Pt wanted to inform PCP

## 2023-01-04 NOTE — Telephone Encounter (Signed)
Requested Prescriptions  Pending Prescriptions Disp Refills   hydrochlorothiazide (HYDRODIURIL) 12.5 MG tablet [Pharmacy Med Name: hydroCHLOROthiazide 12.5 MG Oral Tablet] 90 tablet 0    Sig: Take 1 tablet by mouth once daily for blood pressure     Cardiovascular: Diuretics - Thiazide Failed - 01/04/2023  3:30 PM      Failed - Last BP in normal range    BP Readings from Last 1 Encounters:  10/05/22 (!) 173/75         Passed - Cr in normal range and within 180 days    Creatinine, Ser  Date Value Ref Range Status  09/13/2022 0.73 0.57 - 1.00 mg/dL Final         Passed - K in normal range and within 180 days    Potassium  Date Value Ref Range Status  09/13/2022 4.4 3.5 - 5.2 mmol/L Final         Passed - Na in normal range and within 180 days    Sodium  Date Value Ref Range Status  09/13/2022 139 134 - 144 mmol/L Final         Passed - Valid encounter within last 6 months    Recent Outpatient Visits           3 months ago Annual physical exam   [place], Donald E, MD   8 months ago Facial hematoma, subsequent encounter   [place], Dionne Bucy, MD   10 months ago Primary hypertension   [place], Donald E, MD   11 months ago Primary hypertension   [place], Donald E, MD   1 year ago Primary hypertension   [place], Donald E, MD       Future Appointments             In 1 month Fisher, Kirstie Peri, MD [facility]

## 2023-02-03 NOTE — Progress Notes (Signed)
     I,Sha'taria Tyson,acting as a Education administrator for Lelon Huh, MD.,have documented all relevant documentation on the behalf of Lelon Huh, MD,as directed by  Lelon Huh, MD while in the presence of Lelon Huh, MD.   Established patient visit   Patient: Mary Mosley   DOB: 11/29/1940   82 y.o. Female  MRN: [id number] Visit Date: 02/06/2023  Today's healthcare provider: Lelon Huh, MD   Chief Complaint  Patient presents with   Hypertension   Subjective    HPI  Hypertension, follow-up  BP Readings from Last 3 Encounters:  10/05/22 (!) 173/75  09/13/22 (!) 160/76  08/29/22 (!) 158/70   Wt Readings from Last 3 Encounters:  01/02/23 121 lb (54.9 kg)  10/05/22 121 lb (54.9 kg)  09/13/22 124 lb 8 oz (56.5 kg)     She was last seen for hypertension 4 months ago.  BP at that visit was 160/76. Management since that visit includes continue current treatment.  Outside blood pressures are 120's/60's  She does report having itching around torso that started a couple of months ago and is wondering if it related to hctz, but has had no rashes.   Symptoms: No chest pain No chest pressure  No palpitations No syncope  No dyspnea No orthopnea  No paroxysmal nocturnal dyspnea No lower extremity edema    ---------------------------------------------------------------------------------------------------   Medications: Outpatient Medications Prior to Visit  Medication Sig   cholecalciferol (VITAMIN D) 1000 UNITS tablet Take 1 tablet by mouth daily.   hydrochlorothiazide (HYDRODIURIL) 12.5 MG tablet Take 1 tablet by mouth once daily for blood pressure   LORazepam (ATIVAN) 1 MG tablet Take 0.5 tablets (0.5 mg total) by mouth at bedtime as needed. (Patient not taking: Reported on 10/05/2022)   traZODone (DESYREL) 50 MG tablet Take 1-2 tablets (50-100 mg total) by mouth at bedtime.   No facility-administered medications prior to visit.        Objective    BP (!) 150/79 (BP  Location: Right Arm, Patient Position: Sitting, Cuff Size: Normal)   Pulse 79   Wt 122 lb (55.3 kg)   SpO2 100%   BMI 19.69 kg/m    Physical Exam   General appearance: Thin female, cooperative and in no acute distress Head: Normocephalic, without obvious abnormality, atraumatic Respiratory: Respirations even and unlabored, normal respiratory rate Extremities: All extremities are intact.  Skin: Skin color, texture, turgor normal. No rashes seen  Psych: Appropriate mood and affect. Neurologic: Mental status: Alert, oriented to person, place, and time, thought content appropriate.   Assessment & Plan     1. Primary hypertension Improved, but not too goal. Unclear if itching is related to medications. She will put hctz on hold for 1 week and will call her next week to see if itching resolves.   2. Pruritus   3. Estrogen deficiency  - DG Bone density [place]; Future      The entirety of the information documented in the History of Present Illness, Review of Systems and Physical Exam were personally obtained by me. Portions of this information were initially documented by the CMA and reviewed by me for thoroughness and accuracy.     Lelon Huh, MD  [place] [phone] (phone) [phone] (fax)  [place]

## 2023-02-06 ENCOUNTER — Ambulatory Visit (INDEPENDENT_AMBULATORY_CARE_PROVIDER_SITE_OTHER): Payer: Medicare Other | Admitting: Family Medicine

## 2023-02-06 ENCOUNTER — Encounter: Payer: Self-pay | Admitting: Family Medicine

## 2023-02-06 VITALS — BP 150/79 | HR 79 | Wt 122.0 lb

## 2023-02-06 DIAGNOSIS — I10 Essential (primary) hypertension: Secondary | ICD-10-CM | POA: Diagnosis not present

## 2023-02-06 DIAGNOSIS — L299 Pruritus, unspecified: Secondary | ICD-10-CM | POA: Diagnosis not present

## 2023-02-06 DIAGNOSIS — E2839 Other primary ovarian failure: Secondary | ICD-10-CM

## 2023-02-16 ENCOUNTER — Telehealth: Payer: Self-pay | Admitting: Family Medicine

## 2023-02-16 DIAGNOSIS — I10 Essential (primary) hypertension: Secondary | ICD-10-CM

## 2023-02-16 MED ORDER — DILTIAZEM HCL ER COATED BEADS 120 MG PO CP24: 120.0000 mg | ORAL_CAPSULE | Freq: Every day | ORAL | 1 refills | Status: DC

## 2023-02-16 NOTE — Telephone Encounter (Signed)
Change to diltiazem CD, have sent prescription to take once a day. Please schedule follow up in 4 weeks.

## 2023-02-16 NOTE — Telephone Encounter (Signed)
Pt was seen on 3-18 and put hctz on hold due to itching. Please see if itching has resolved since stopping medication. If so will need to change to different medication.

## 2023-03-28 ENCOUNTER — Other Ambulatory Visit: Payer: Self-pay | Admitting: Family Medicine

## 2023-03-28 DIAGNOSIS — Z1231 Encounter for screening mammogram for malignant neoplasm of breast: Secondary | ICD-10-CM

## 2023-04-28 ENCOUNTER — Ambulatory Visit
Admission: RE | Admit: 2023-04-28 | Discharge: 2023-04-28 | Disposition: A | Discharge status: Home / Self Care | Payer: Medicare Other | Source: Ambulatory Visit | Attending: Family Medicine | Admitting: Family Medicine

## 2023-04-28 DIAGNOSIS — Z780 Asymptomatic menopausal state: Secondary | ICD-10-CM | POA: Diagnosis not present

## 2023-04-28 DIAGNOSIS — E2839 Other primary ovarian failure: Secondary | ICD-10-CM | POA: Diagnosis present

## 2023-04-28 DIAGNOSIS — M8589 Other specified disorders of bone density and structure, multiple sites: Secondary | ICD-10-CM | POA: Diagnosis not present

## 2023-05-22 ENCOUNTER — Ambulatory Visit (INDEPENDENT_AMBULATORY_CARE_PROVIDER_SITE_OTHER): Payer: Medicare Other | Admitting: Family Medicine

## 2023-05-22 VITALS — BP 178/87 | HR 102 | Ht 67.0 in | Wt 119.0 lb

## 2023-05-22 DIAGNOSIS — J040 Acute laryngitis: Secondary | ICD-10-CM

## 2023-05-22 DIAGNOSIS — I10 Essential (primary) hypertension: Secondary | ICD-10-CM | POA: Diagnosis not present

## 2023-05-22 NOTE — Progress Notes (Signed)
      Established patient visit   Patient: Mary Mosley   DOB: 1941-09-12   82 y.o. Female  MRN: [id number] Visit Date: 05/22/2023  Today's healthcare provider: Mila Merry, MD   No chief complaint on file.  Subjective    Discussed the use of AI scribe software for clinical note transcription with the patient, who gave verbal consent to proceed.  History of Present Illness   The patient, with a history of hypertension, presents with hoarseness that began on Friday. She denies any preceding illness or symptoms such as cold, runny nose, or congestion. She also denies any difficulty swallowing or sensation of food getting stuck in her throat. She has not experienced any fevers, chills, or sweats. The patient reports that the hoarseness has improved slightly since the weekend.    She also reports a history of adverse reactions to diltiazem and hctz taken since last visit, including severe itching, and has discontinued their use, with symptoms resolving after medications were discontinued. She reports her blood pressure readings are consistently in the 130s.       Medications: Outpatient Medications Prior to Visit  Medication Sig   cholecalciferol (VITAMIN D) 1000 UNITS tablet Take 1 tablet by mouth daily.   diltiazem (CARDIZEM CD) 120 MG 24 hr capsule Take 1 capsule (120 mg total) by mouth daily.   MAXITROL 3.5-10000-0.1 OINT    naproxen sodium (ALEVE) 220 MG tablet Take 220 mg by mouth daily as needed. Back and muscle pain 24 hr tablet   traZODone (DESYREL) 50 MG tablet Take 1-2 tablets (50-100 mg total) by mouth at bedtime.   hydrochlorothiazide (HYDRODIURIL) 12.5 MG tablet Take 1 tablet by mouth once daily for blood pressure (Patient not taking: Reported on 05/22/2023)   No facility-administered medications prior to visit.   Review of Systems     Objective    BP (!) 178/87 (BP Location: Right Arm, Patient Position: Sitting, Cuff Size: Normal)   Pulse (!) 102   Ht 5\' 7"   (1.702 m)   Wt 119 lb (54 kg)   BMI 18.64 kg/m    Physical Exam  Physical Exam        No results found for any visits on 05/22/23.  Assessment & Plan     Assessment and Plan    Hoarseness: Acute onset since Friday, likely viral etiology. No associated cold symptoms, dysphagia, or systemic symptoms. No tenderness on neck palpation. -Expectant management as it is likely self-limiting. -Advise over-the-counter Coricidin if cold symptoms develop.  Hypertension: Patient reports consistent home readings in the 130s. Previous antihypertensive medications (including Diltiazem and hctz) discontinued due to adverse effects (itching). -Continue monitoring blood pressure at home daily. -Notify if readings consistently in the 140s or 150s for potential medication adjustment.            Mila Merry, MD  [facility] [phone] (phone) [phone] (fax)  [facility]

## 2023-06-19 ENCOUNTER — Ambulatory Visit (INDEPENDENT_AMBULATORY_CARE_PROVIDER_SITE_OTHER): Payer: Medicare Other | Admitting: Family Medicine

## 2023-06-19 VITALS — BP 163/71 | HR 75 | Ht 67.0 in | Wt 119.9 lb

## 2023-06-19 DIAGNOSIS — I10 Essential (primary) hypertension: Secondary | ICD-10-CM

## 2023-06-19 DIAGNOSIS — G4700 Insomnia, unspecified: Secondary | ICD-10-CM | POA: Diagnosis not present

## 2023-06-19 MED ORDER — TRAZODONE HCL 50 MG PO TABS
50.0000 mg | ORAL_TABLET | Freq: Every day | ORAL | 2 refills | Status: DC
Start: 2023-06-19 — End: 2024-01-31

## 2023-06-19 NOTE — Progress Notes (Signed)
      Established patient visit   Patient: Mary Mosley   DOB: 07-05-1941   82 y.o. Female  MRN: [id number] Visit Date: 06/19/2023  Today's healthcare provider: Mila Merry, MD   Chief Complaint  Patient presents with   Hypertension   Subjective    Discussed the use of AI scribe software for clinical note transcription with the patient, who gave verbal consent to proceed.  History of Present Illness   The patient, with a history of hypertension, reports consistent blood pressure readings in the 120s to 130s at home, checked twice daily. She previously experienced severe adverse reactions to hctz and amlodipine describing the symptoms as 'almost unbearable." The patient prefers to avoid medication changes as long as the blood pressure remains stable.  Additionally, the patient has been taking Trazodone as needed for sleep disturbances, which began after an accident in 56. She usually takes the medication with food, specifically yogurt, as the bottle instructs, but inquired about the necessity of this practice. The patient only takes the medication when she has trouble sleeping, which is not every night. She requested a refill of this medication.       Medications: Outpatient Medications Prior to Visit  Medication Sig   cholecalciferol (VITAMIN D) 1000 UNITS tablet Take 1 tablet by mouth daily.   MAXITROL 3.5-10000-0.1 OINT    naproxen sodium (ALEVE) 220 MG tablet Take 220 mg by mouth daily as needed. Back and muscle pain 24 hr tablet   [DISCONTINUED] traZODone (DESYREL) 50 MG tablet Take 1-2 tablets (50-100 mg total) by mouth at bedtime.   No facility-administered medications prior to visit.      Objective    BP (!) 163/71 (BP Location: Left Arm, Patient Position: Sitting, Cuff Size: Normal)   Pulse 75   Ht 5\' 7"  (1.702 m)   Wt 119 lb 14.4 oz (54.4 kg)   BMI 18.78 kg/m   Physical Exam  General appearance: Thin female, cooperative and in no acute distress Head:  Normocephalic, without obvious abnormality, atraumatic Respiratory: Respirations even and unlabored, normal respiratory rate Extremities: All extremities are intact.  Skin: Skin color, texture, turgor normal. No rashes seen  Psych: Appropriate mood and affect. Neurologic: Mental status: Alert, oriented to person, place, and time, thought content appropriate.   Assessment & Plan     Assessment and Plan    Hypertension: Home blood pressure readings in the 120s-130s.. Prior adverse reaction to other antihypertensive medications. -Continue current management and home monitoring of blood pressure. -Report any significant changes in blood pressure readings.  Insomnia: Trazodone used as needed for sleep. Patient reports good effect when taken with food, but inquires about taking it without food. -Continue Trazodone as needed for sleep. -Advise that it can be taken without food, but may not be as effective. -Consider dose adjustment if effectiveness decreases without food. -Ref[facility] Trazodone prescription at [facility].  Follow-up in 4-5 months or sooner if any issues arise with blood pressure or sleep.       Return in about 4 months (around 10/20/2023) for Hypertension.      Mila Merry, MD  [facility] [phone] (phone) [phone] (fax)  [facility]

## 2023-06-19 NOTE — Patient Instructions (Signed)
.   Please review the attached list of medications and notify my office if there are any errors.   . Please bring all of your medications to every appointment so we can make sure that our medication list is the same as yours.   

## 2023-07-10 ENCOUNTER — Ambulatory Visit
Admission: RE | Admit: 2023-07-10 | Discharge: 2023-07-10 | Disposition: A | Discharge status: Home / Self Care | Payer: Medicare Other | Source: Ambulatory Visit | Attending: Family Medicine | Admitting: Family Medicine

## 2023-07-10 DIAGNOSIS — Z1231 Encounter for screening mammogram for malignant neoplasm of breast: Secondary | ICD-10-CM | POA: Insufficient documentation

## 2023-08-02 DIAGNOSIS — Z961 Presence of intraocular lens: Secondary | ICD-10-CM | POA: Diagnosis not present

## 2023-08-02 DIAGNOSIS — H35371 Puckering of macula, right eye: Secondary | ICD-10-CM | POA: Diagnosis not present

## 2023-08-12 IMAGING — CT CT HEAD W/O CM
4 series · 16 of 47 positions shown, 18 images · non-contrast
Comparison: None Available.

CLINICAL DATA: Trauma, fall



[Series 2: head wo · axial · 0.45mm/px · z∈[+386,+506]mm · 7 of 32 slices shown, 9 images]
[im 4/32  brain]
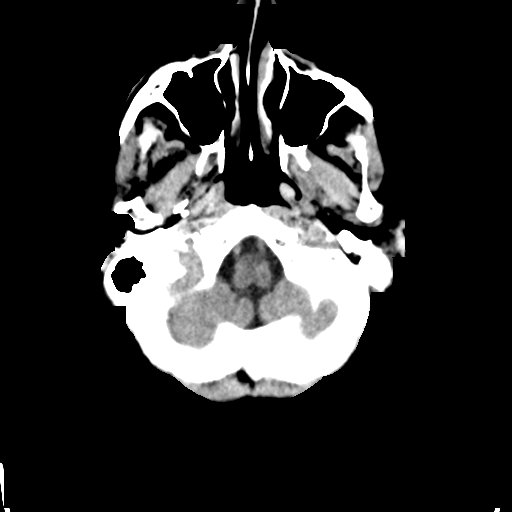
[im 4/32  bone]
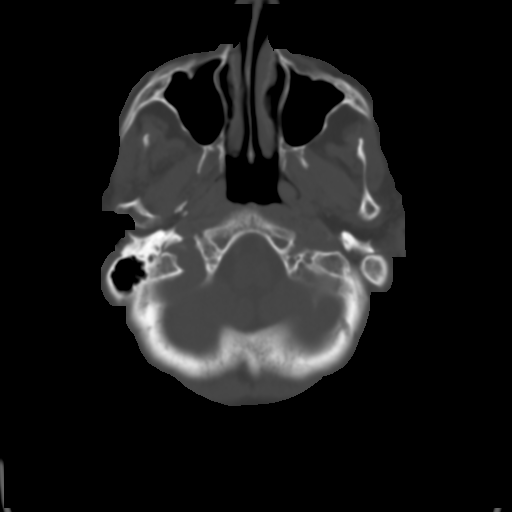
[im 8/32  brain]
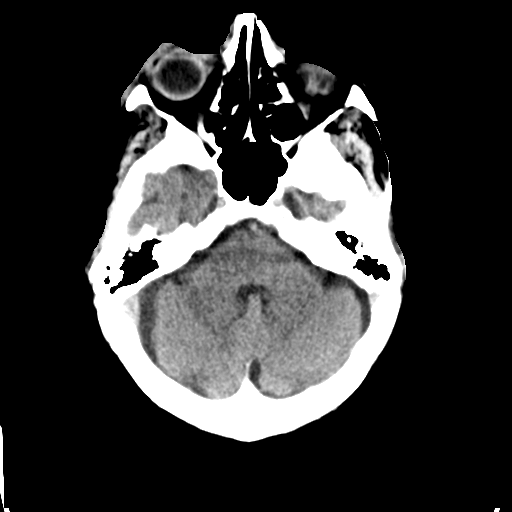
[im 12/32  brain]
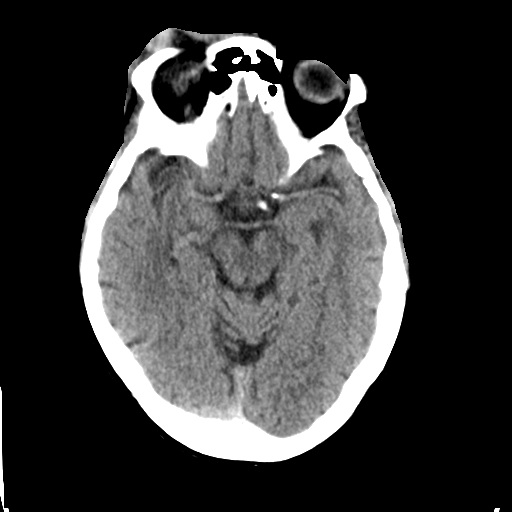
[im 16/32  brain]
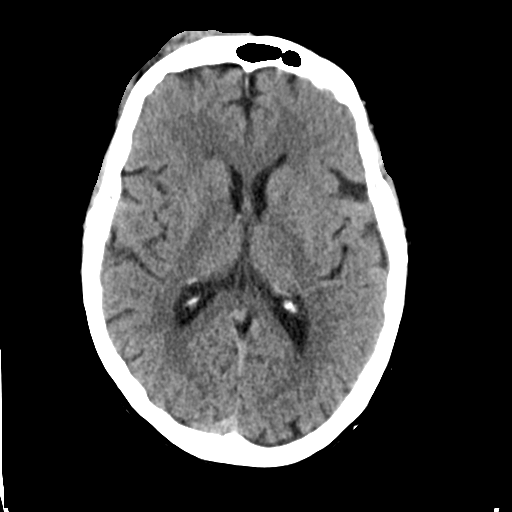
[im 20/32  brain]
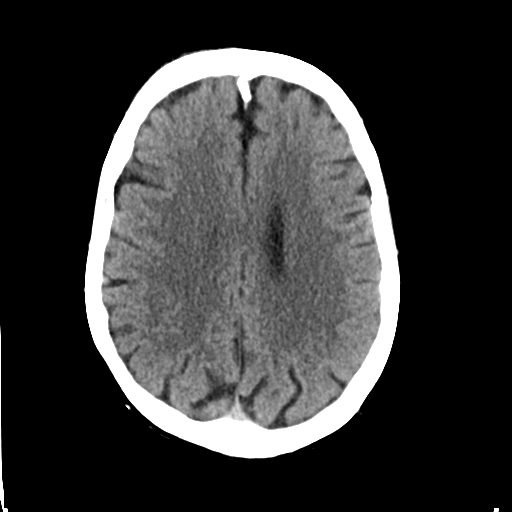
[im 20/32  bone]
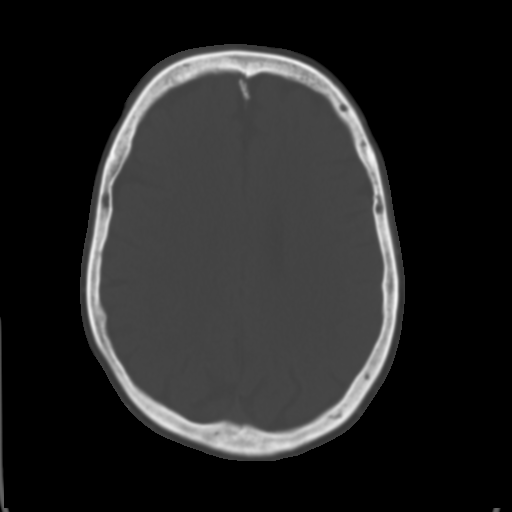
[im 24/32  brain]
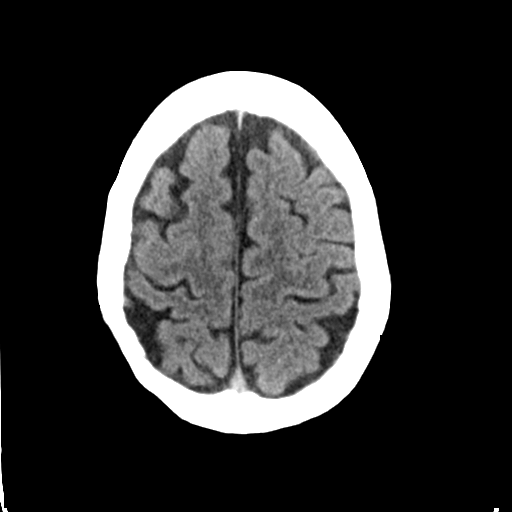
[im 28/32  brain]
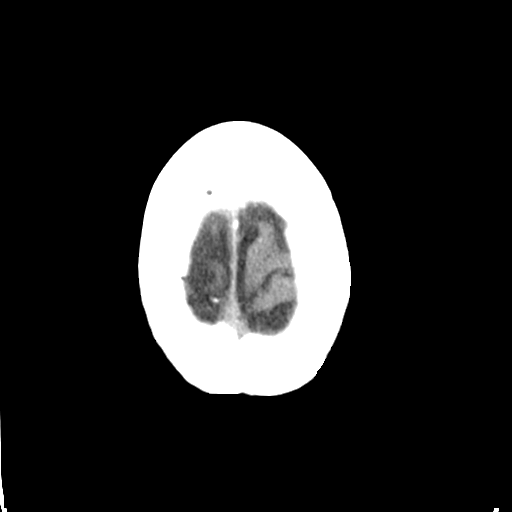

[Series 3: head bone · axial · 0.45mm/px · z∈[+385,+417]mm · 3 of 80 slices shown]
[im 8/80  bone]
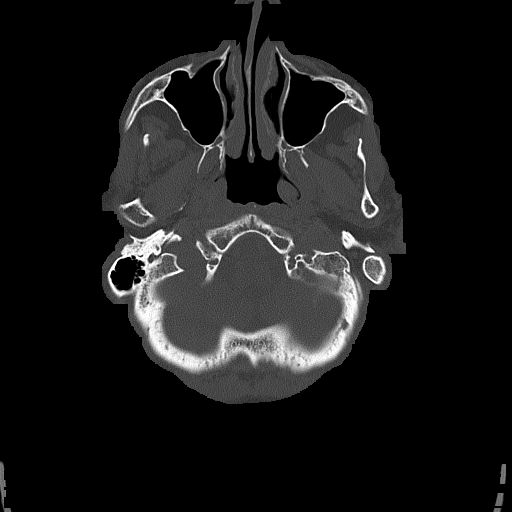
[im 16/80  bone]
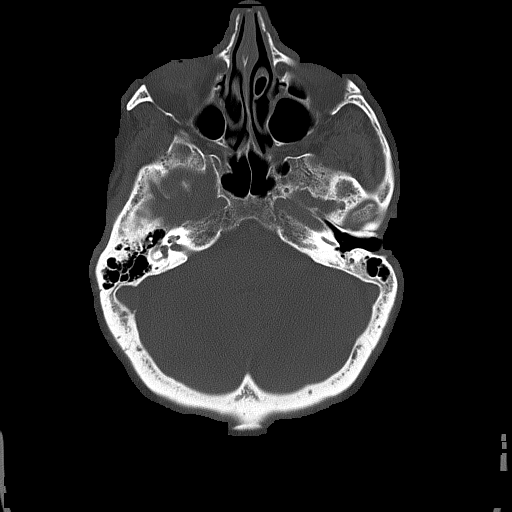
[im 24/80  bone]
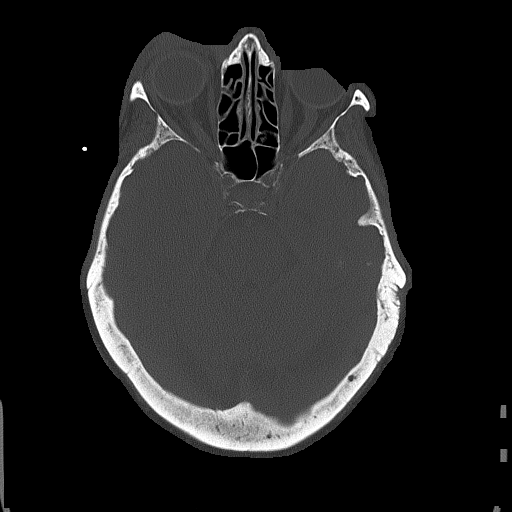

[Series 4: coronal soft tissue · coronal · 0.33mm/px · 3 of 71 slices shown]
[im 24/71  brain]
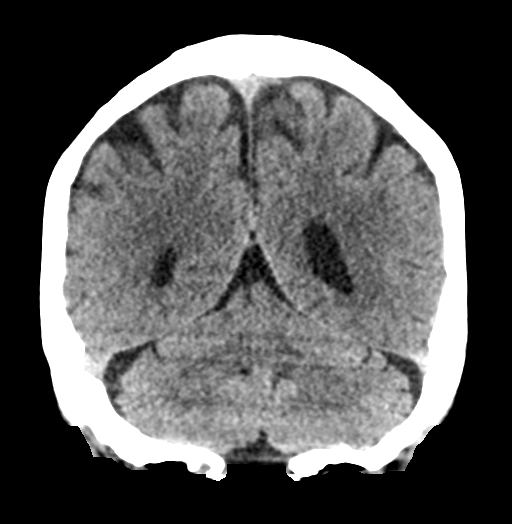
[im 32/71  brain]
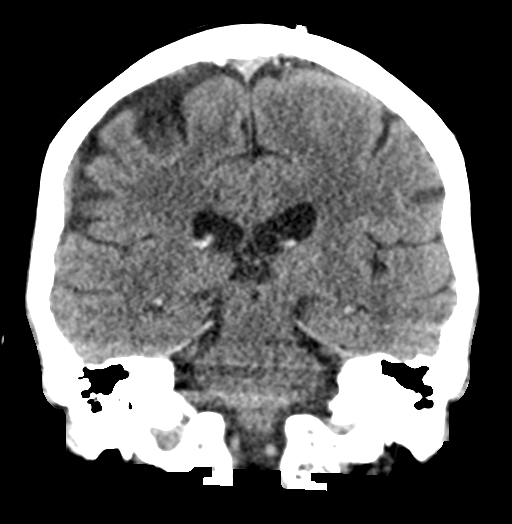
[im 39/71  brain]
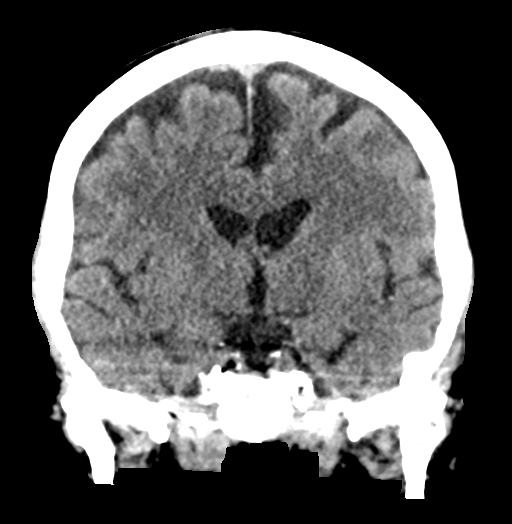

[Series 5: sagittal soft tissue · sagittal · 0.33mm/px · 3 of 54 slices shown]
[im 18/54  brain]
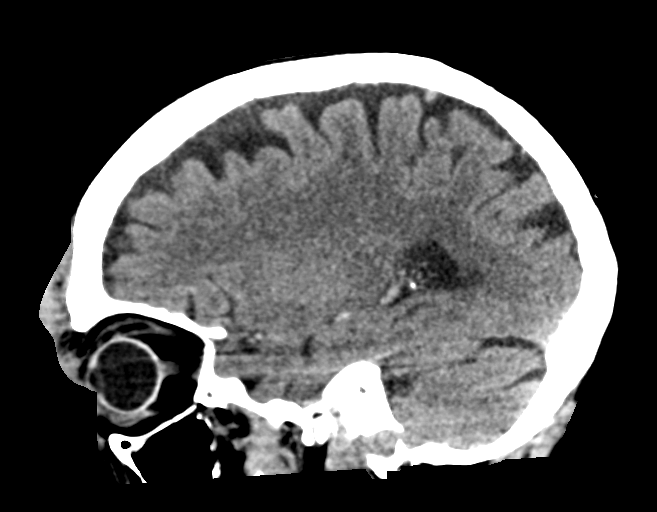
[im 27/54  brain]
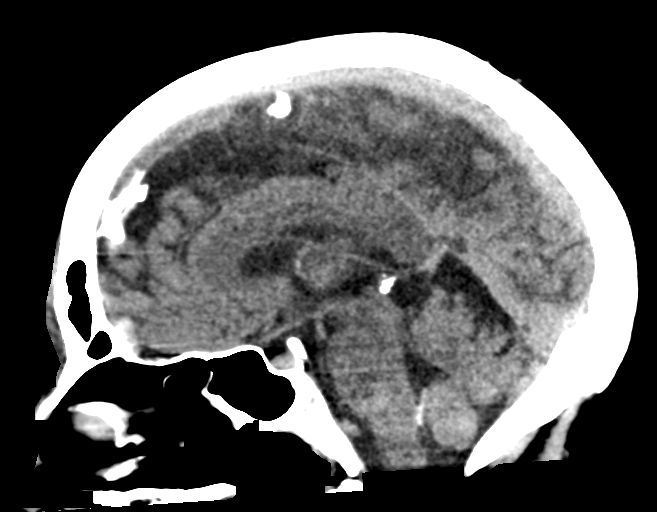
[im 36/54  brain]
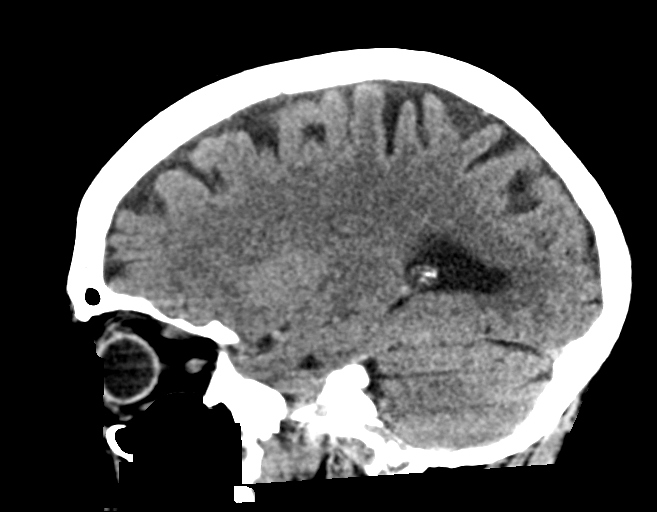

[16 of 47 positions shown; findings below may reference images not displayed]

FINDINGS: Brain: No acute intracranial findings are seen. There are no signs
of intracranial bleeding. Cortical sulci are prominent. There is no
focal mass effect.

Vascular: There are scattered arterial calcifications.

Skull: No fracture is seen in the calvarium. There is large
subcutaneous hematoma in the right periorbital region.

Sinuses/Orbits: There are no air-fluid levels in the paranasal
sinuses.

Other: None.
IMPRESSION: No acute intracranial findings are seen in noncontrast CT brain.

There is subcutaneous hematoma in the right frontal scalp. No
fracture is seen in the calvarium.

## 2023-08-12 IMAGING — CT CT MAXILLOFACIAL W/O CM
3 series · 16 of 47 positions shown, 19 images · non-contrast
Comparison: None Available.

CLINICAL DATA: Trauma, fall



[Series 2: max soft · axial · 0.33mm/px · z∈[+308,+452]mm · 10 of 84 slices shown, 13 images]
[im 6/84  brain]
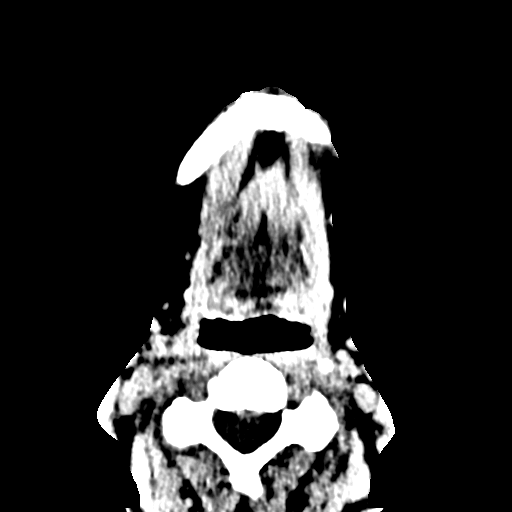
[im 6/84  bone]
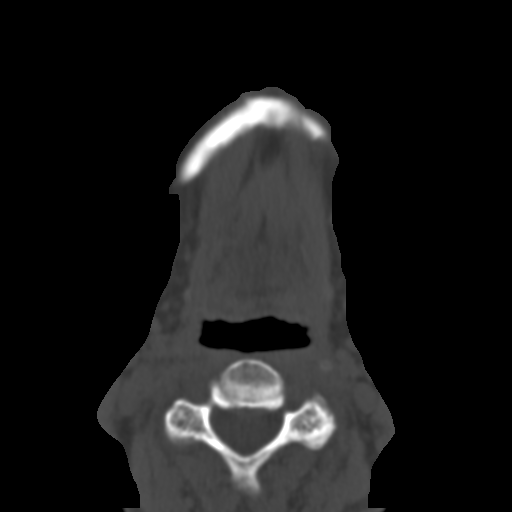
[im 15/84  bone]
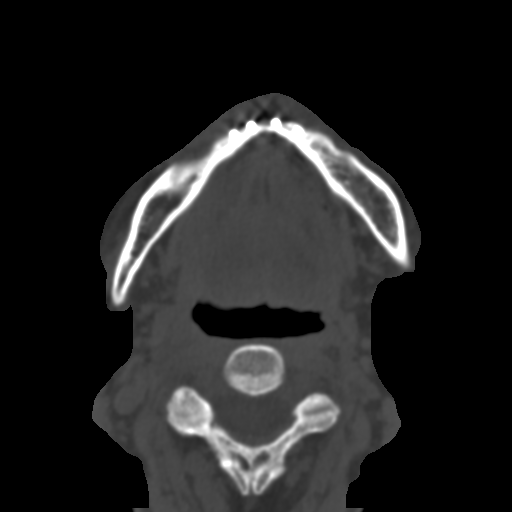
[im 23/84  bone]
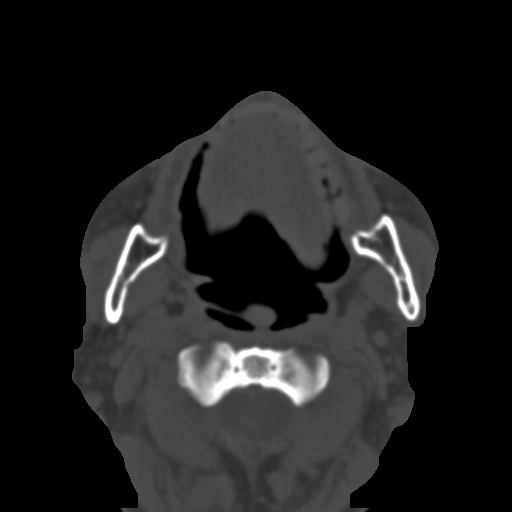
[im 29/84  bone]
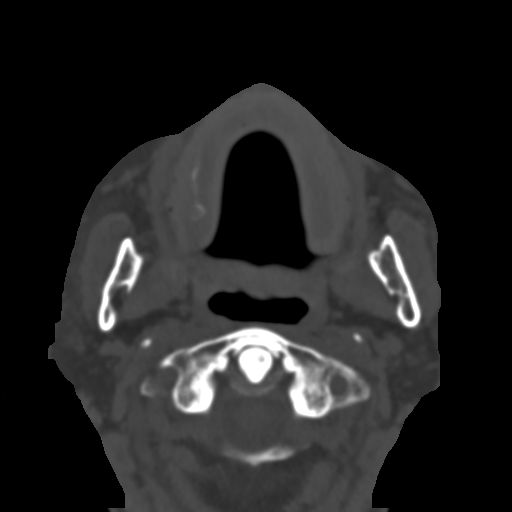
[im 38/84  brain]
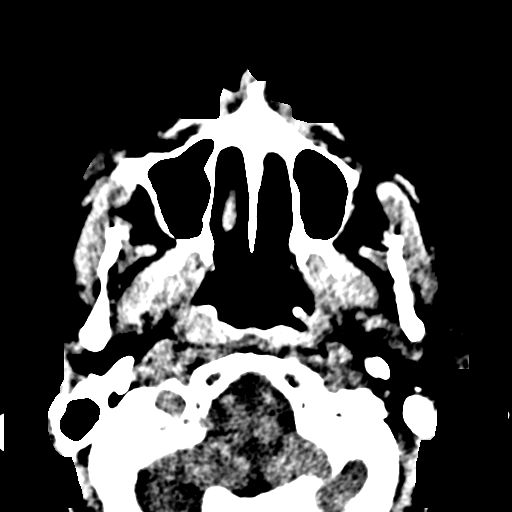
[im 38/84  bone]
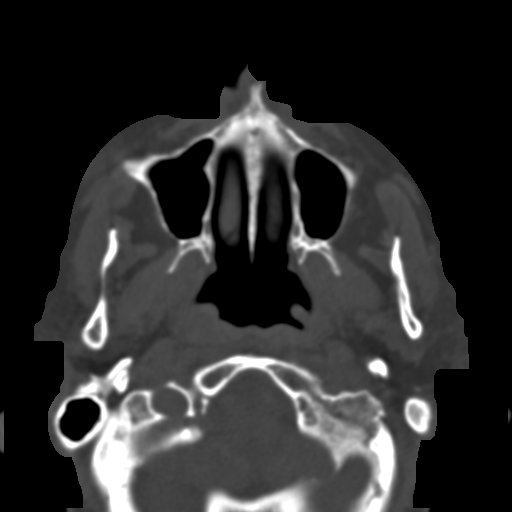
[im 46/84  bone]
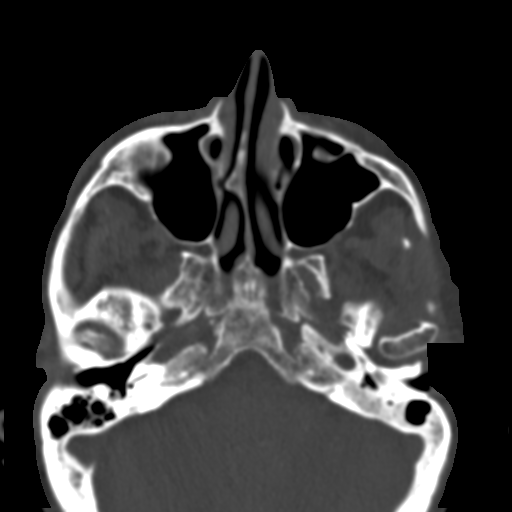
[im 55/84  bone]
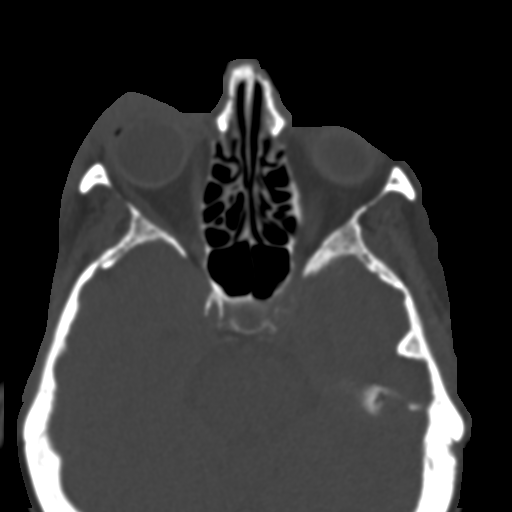
[im 63/84  bone]
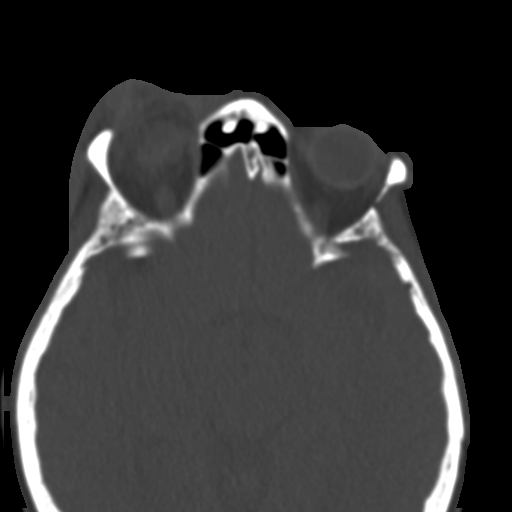
[im 69/84  brain]
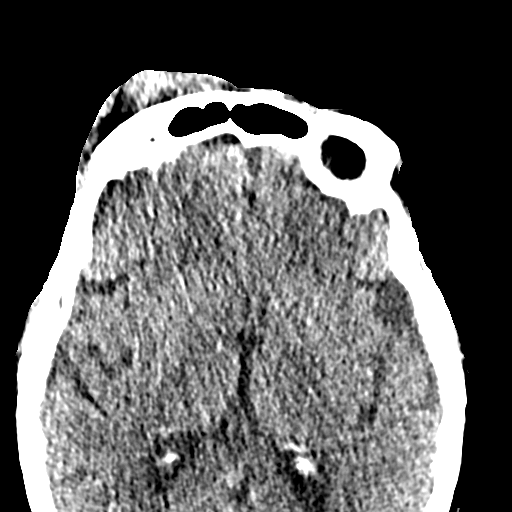
[im 69/84  bone]
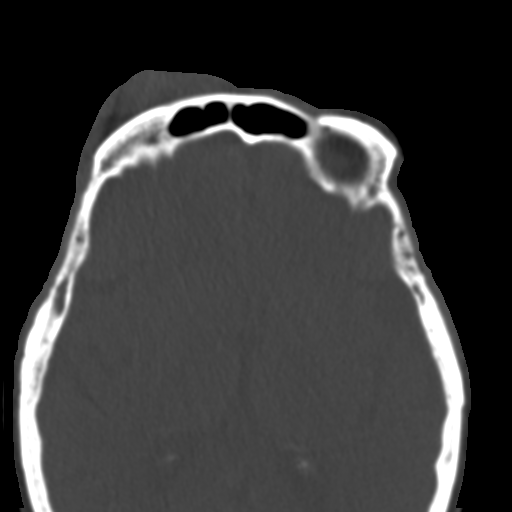
[im 78/84  bone]
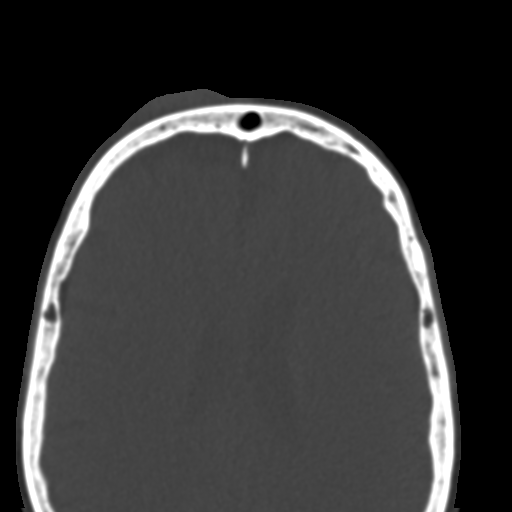

[Series 6: coronal soft · coronal · 0.32mm/px · 3 of 80 slices shown]
[im 27/80  bone]
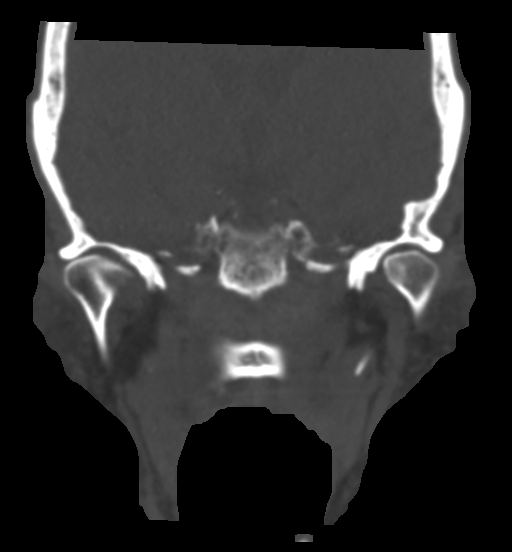
[im 36/80  bone]
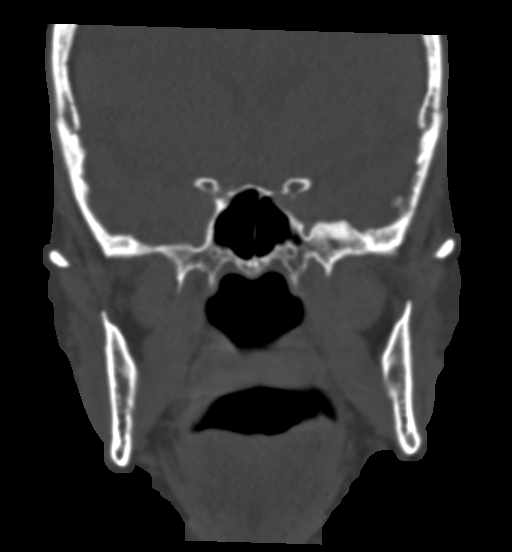
[im 44/80  bone]
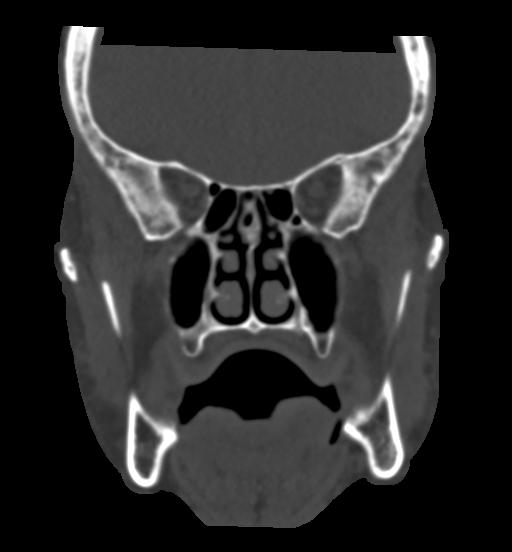

[Series 7: sagittal soft · sagittal · 0.35mm/px · 3 of 74 slices shown]
[im 25/74  bone]
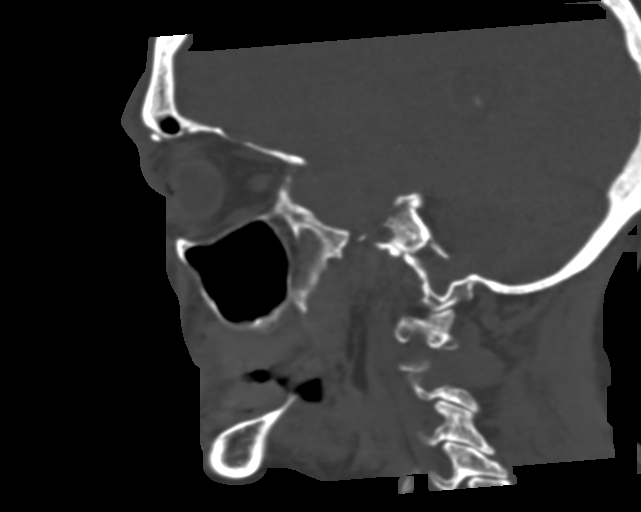
[im 37/74  bone]
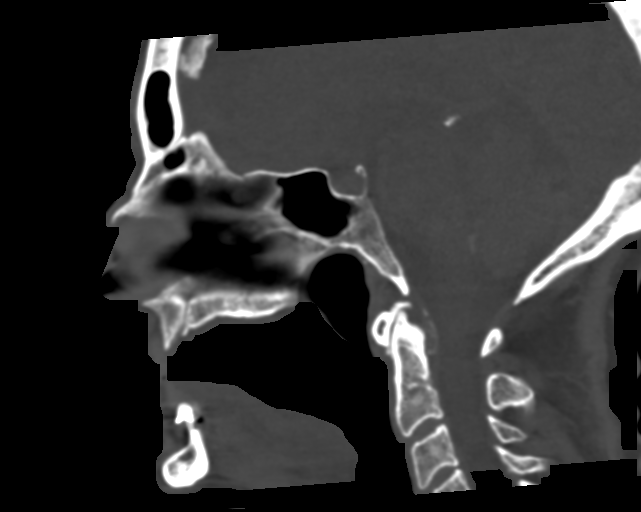
[im 49/74  bone]
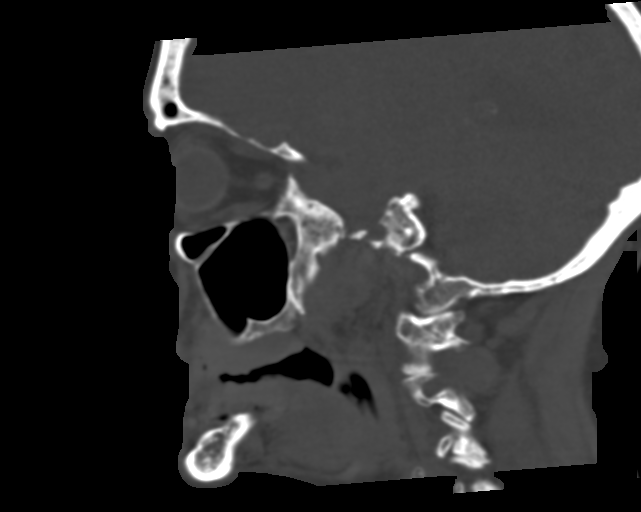

[16 of 47 positions shown; findings below may reference images not displayed]

FINDINGS: Osseous: No displaced fractures are seen.

Orbits: No focal abnormality is seen. Optic globes are symmetrical.
Retrobulbar soft tissues are unremarkable. There is subcutaneous
contusion/hematoma in the right periorbital region.

Sinuses: There are no air-fluid levels or significant mucosal
thickening.

Soft tissues: There is subcutaneous hematoma in the right
periorbital region.

Limited intracranial: Unremarkable.
IMPRESSION: No recent fracture is seen in the facial bones. There is large right
periorbital subcutaneous hematoma.

## 2023-08-12 IMAGING — CT CT CERVICAL SPINE W/O CM
3 of 4 series · 12 of 33 positions shown, 14 images · non-contrast
Comparison: None Available.

CLINICAL DATA: Trauma, fall



[Series 4: sagittal bone · sagittal · 0.26mm/px · 5 of 48 slices shown, 6 images]
[im 16/48  bone]
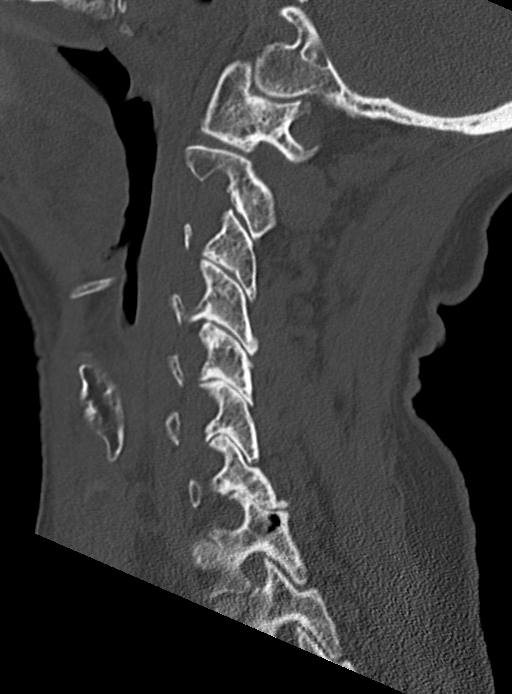
[im 20/48  bone]
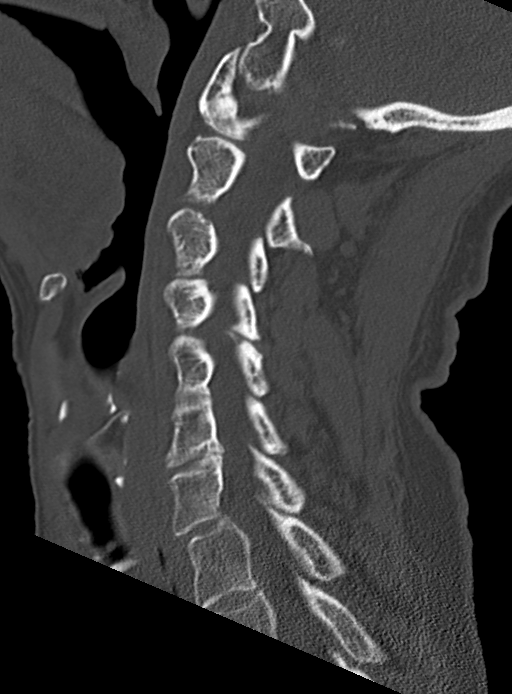
[im 24/48  soft-tissue]
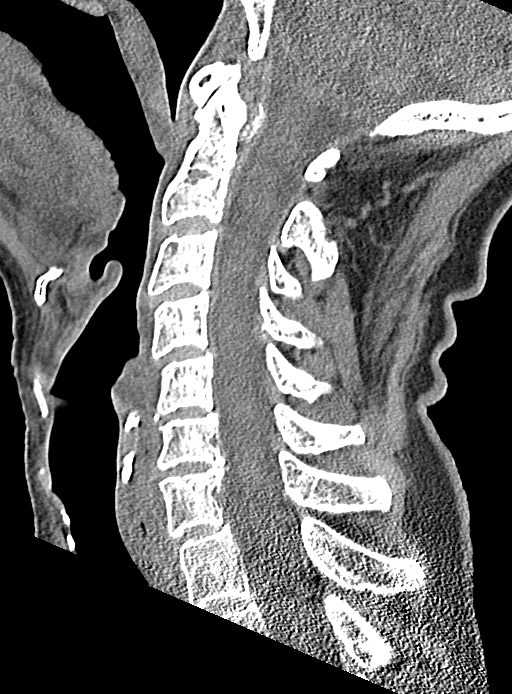
[im 24/48  bone]
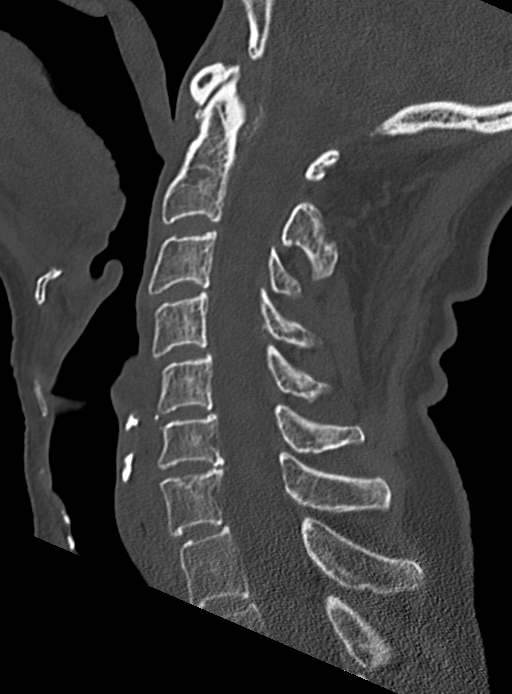
[im 28/48  bone]
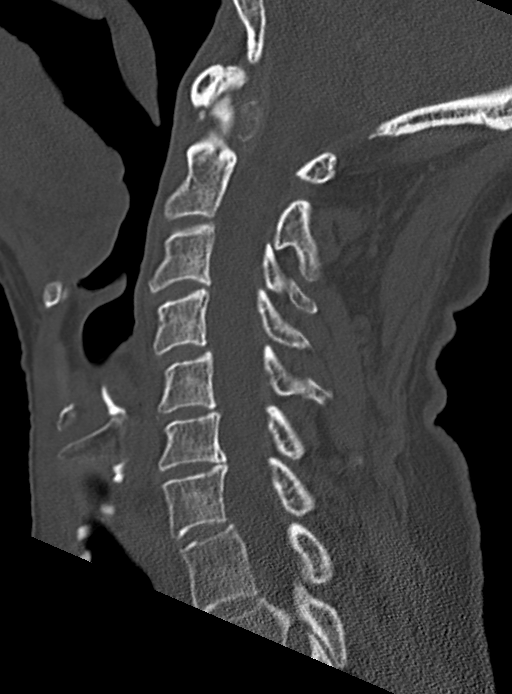
[im 32/48  bone]
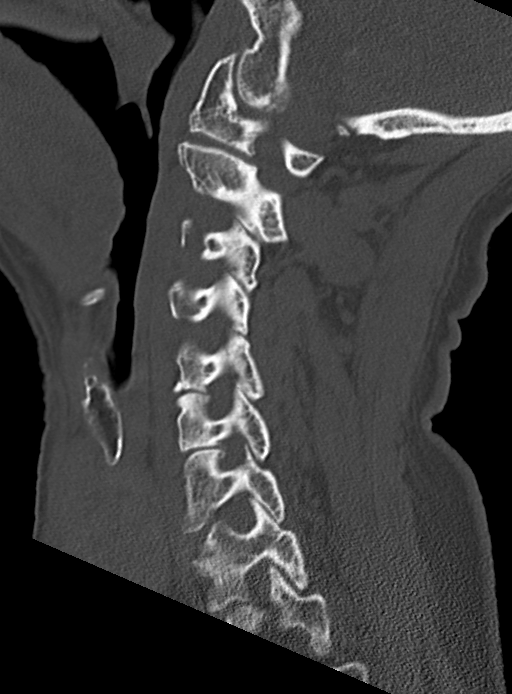

[Series 5: coronal bone · coronal · 0.23mm/px · 3 of 64 slices shown]
[im 13/64  bone]
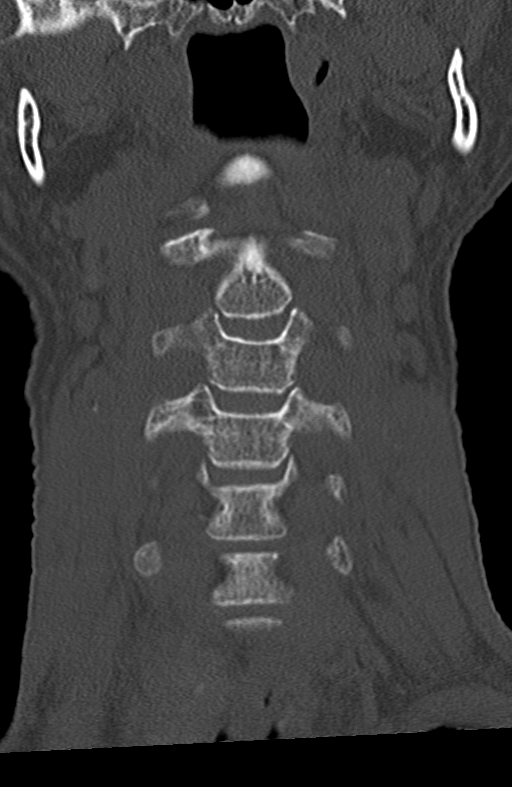
[im 26/64  bone]
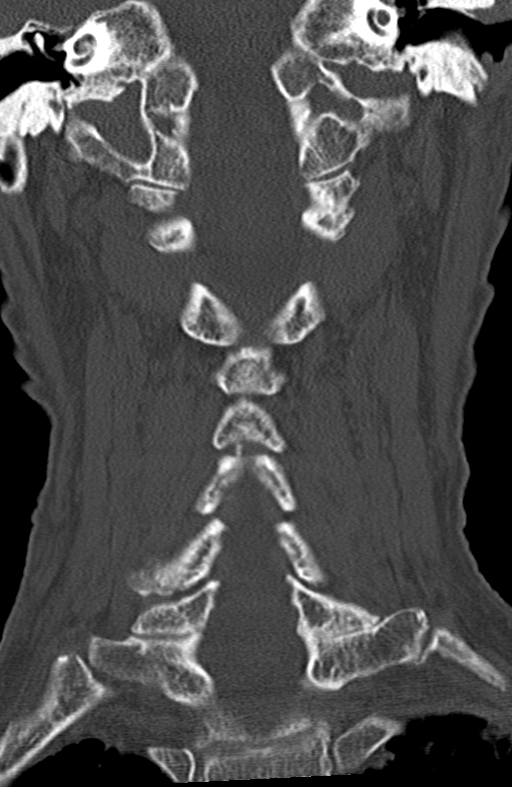
[im 38/64  bone]
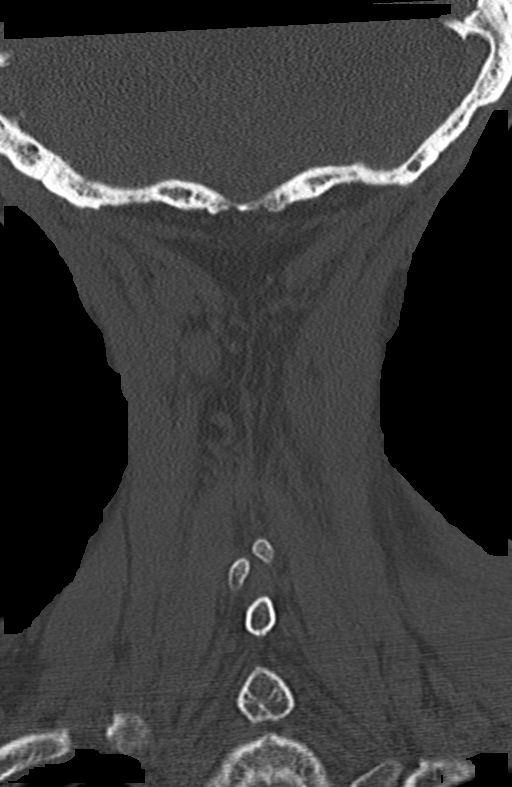

[Series 6: orthogonal axials · axial · 0.24mm/px · z∈[+231,+340]mm · 4 of 89 slices shown, 5 images]
[im 15/89  soft-tissue]
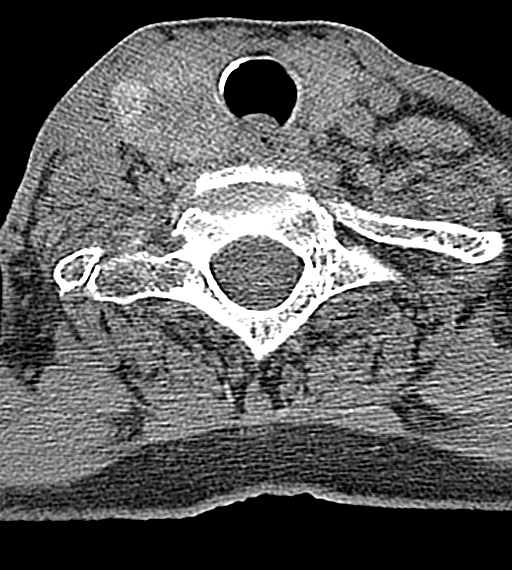
[im 15/89  bone]
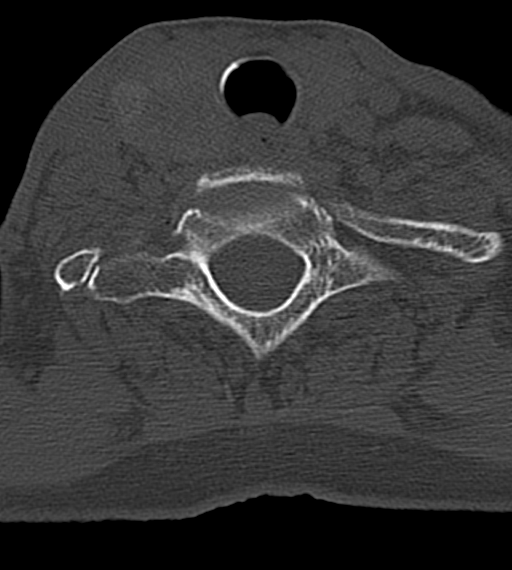
[im 30/89  bone]
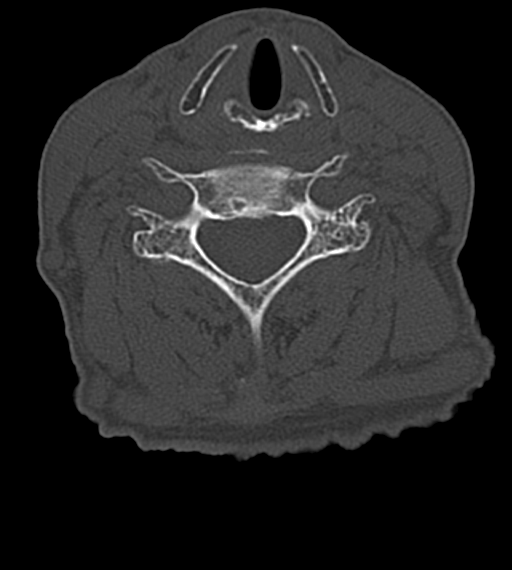
[im 59/89  bone]
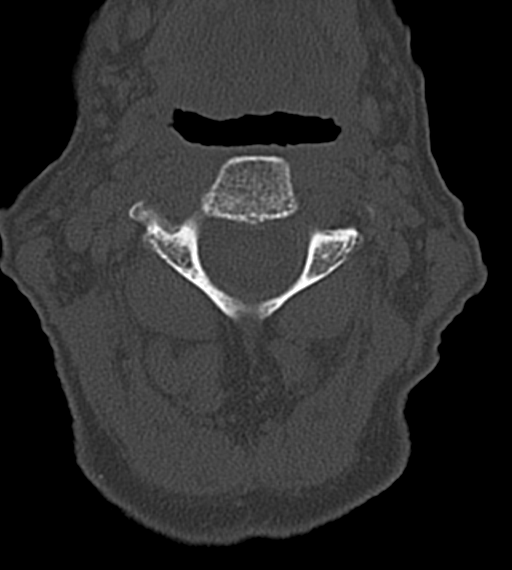
[im 74/89  bone]
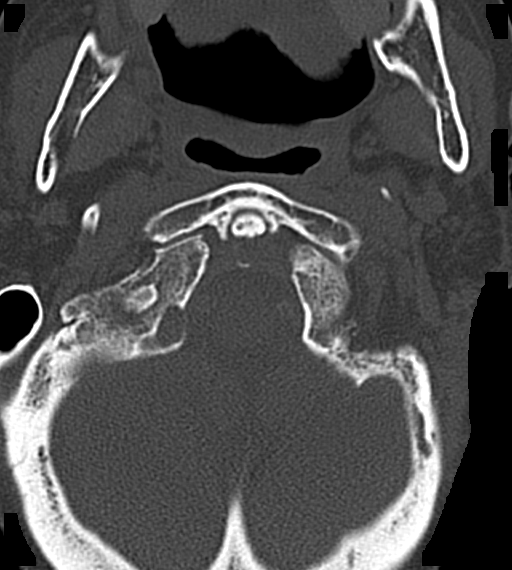

[12 of 33 positions shown; findings below may reference images not displayed]

FINDINGS: Alignment: Alignment of posterior margins of vertebral bodies is
unremarkable.

Skull base and vertebrae: No recent fracture is seen. Small linear
smooth marginated calcification adjacent to the spinous process of
C6 vertebra may suggest ligament calcification from previous injury.

Soft tissues and spinal canal: There is no central spinal stenosis.

Disc levels: There is mild encroachment of neural foramina at C6-C7
level.

Upper chest: Linear densities seen in both apices suggesting
scarring.

Other: Thyroid is enlarged with inhomogeneous attenuation and
multiple nodules of varying attenuation.
IMPRESSION: No recent fracture is seen in the cervical spine.

There is mild encroachment of neural foramina by bony spurs at C6-C7
level.

Possible multinodular goiter in the thyroid.

## 2023-11-08 ENCOUNTER — Inpatient Hospital Stay
Admission: EM | Admit: 2023-11-08 | Discharge: 2023-11-11 | DRG: 522 | Disposition: A | Discharge status: Home Health | Payer: Medicare Other | Attending: Internal Medicine | Admitting: Internal Medicine

## 2023-11-08 ENCOUNTER — Emergency Department: Payer: Medicare Other

## 2023-11-08 ENCOUNTER — Other Ambulatory Visit: Payer: Self-pay

## 2023-11-08 DIAGNOSIS — H532 Diplopia: Secondary | ICD-10-CM | POA: Diagnosis not present

## 2023-11-08 DIAGNOSIS — R55 Syncope and collapse: Secondary | ICD-10-CM | POA: Diagnosis present

## 2023-11-08 DIAGNOSIS — Z888 Allergy status to other drugs, medicaments and biological substances status: Secondary | ICD-10-CM | POA: Diagnosis not present

## 2023-11-08 DIAGNOSIS — G4733 Obstructive sleep apnea (adult) (pediatric): Secondary | ICD-10-CM | POA: Diagnosis present

## 2023-11-08 DIAGNOSIS — E860 Dehydration: Secondary | ICD-10-CM | POA: Diagnosis not present

## 2023-11-08 DIAGNOSIS — W19XXXA Unspecified fall, initial encounter: Principal | ICD-10-CM

## 2023-11-08 DIAGNOSIS — Z8249 Family history of ischemic heart disease and other diseases of the circulatory system: Secondary | ICD-10-CM

## 2023-11-08 DIAGNOSIS — Z681 Body mass index (BMI) 19 or less, adult: Secondary | ICD-10-CM

## 2023-11-08 DIAGNOSIS — M25551 Pain in right hip: Secondary | ICD-10-CM | POA: Diagnosis not present

## 2023-11-08 DIAGNOSIS — M8580 Other specified disorders of bone density and structure, unspecified site: Secondary | ICD-10-CM | POA: Diagnosis present

## 2023-11-08 DIAGNOSIS — W010XXA Fall on same level from slipping, tripping and stumbling without subsequent striking against object, initial encounter: Secondary | ICD-10-CM | POA: Diagnosis present

## 2023-11-08 DIAGNOSIS — E440 Moderate protein-calorie malnutrition: Secondary | ICD-10-CM | POA: Diagnosis present

## 2023-11-08 DIAGNOSIS — S72001A Fracture of unspecified part of neck of right femur, initial encounter for closed fracture: Principal | ICD-10-CM | POA: Diagnosis present

## 2023-11-08 DIAGNOSIS — I10 Essential (primary) hypertension: Secondary | ICD-10-CM | POA: Diagnosis not present

## 2023-11-08 DIAGNOSIS — S7291XA Unspecified fracture of right femur, initial encounter for closed fracture: Secondary | ICD-10-CM | POA: Diagnosis present

## 2023-11-08 DIAGNOSIS — S0990XA Unspecified injury of head, initial encounter: Secondary | ICD-10-CM | POA: Diagnosis not present

## 2023-11-08 DIAGNOSIS — Z880 Allergy status to penicillin: Secondary | ICD-10-CM | POA: Diagnosis not present

## 2023-11-08 DIAGNOSIS — Z79899 Other long term (current) drug therapy: Secondary | ICD-10-CM | POA: Diagnosis not present

## 2023-11-08 DIAGNOSIS — Y92009 Unspecified place in unspecified non-institutional (private) residence as the place of occurrence of the external cause: Secondary | ICD-10-CM | POA: Diagnosis not present

## 2023-11-08 DIAGNOSIS — Z01818 Encounter for other preprocedural examination: Secondary | ICD-10-CM | POA: Diagnosis not present

## 2023-11-08 DIAGNOSIS — J9811 Atelectasis: Secondary | ICD-10-CM | POA: Diagnosis not present

## 2023-11-08 DIAGNOSIS — S0003XA Contusion of scalp, initial encounter: Secondary | ICD-10-CM | POA: Diagnosis not present

## 2023-11-08 DIAGNOSIS — S199XXA Unspecified injury of neck, initial encounter: Secondary | ICD-10-CM | POA: Diagnosis not present

## 2023-11-08 DIAGNOSIS — M25572 Pain in left ankle and joints of left foot: Secondary | ICD-10-CM | POA: Diagnosis not present

## 2023-11-08 DIAGNOSIS — R42 Dizziness and giddiness: Secondary | ICD-10-CM | POA: Diagnosis not present

## 2023-11-08 DIAGNOSIS — E049 Nontoxic goiter, unspecified: Secondary | ICD-10-CM | POA: Diagnosis not present

## 2023-11-08 DIAGNOSIS — Y92007 Garden or yard of unspecified non-institutional (private) residence as the place of occurrence of the external cause: Secondary | ICD-10-CM

## 2023-11-08 DIAGNOSIS — I700 Atherosclerosis of aorta: Secondary | ICD-10-CM | POA: Diagnosis not present

## 2023-11-08 LAB — CBC WITH DIFFERENTIAL/PLATELET
Abs Immature Granulocytes: 0.04 10*3/uL (ref 0.00–0.07)
Basophils Absolute: 0 10*3/uL (ref 0.0–0.1)
Basophils Relative: 0 %
Eosinophils Absolute: 0 10*3/uL (ref 0.0–0.5)
Eosinophils Relative: 0 %
HCT: 40.4 % (ref 36.0–46.0)
Hemoglobin: 13.5 g/dL (ref 12.0–15.0)
Immature Granulocytes: 0 %
Lymphocytes Relative: 4 %
Lymphs Abs: 0.4 10*3/uL — ABNORMAL LOW (ref 0.7–4.0)
MCH: 31.5 pg (ref 26.0–34.0)
MCHC: 33.4 g/dL (ref 30.0–36.0)
MCV: 94.2 fL (ref 80.0–100.0)
Monocytes Absolute: 0.6 10*3/uL (ref 0.1–1.0)
Monocytes Relative: 5 %
Neutro Abs: 9.7 10*3/uL — ABNORMAL HIGH (ref 1.7–7.7)
Neutrophils Relative %: 91 %
Platelets: 168 10*3/uL (ref 150–400)
RBC: 4.29 MIL/uL (ref 3.87–5.11)
RDW: 13.3 % (ref 11.5–15.5)
WBC: 10.8 10*3/uL — ABNORMAL HIGH (ref 4.0–10.5)
nRBC: 0 % (ref 0.0–0.2)

## 2023-11-08 LAB — BASIC METABOLIC PANEL
Anion gap: 10 (ref 5–15)
BUN: 18 mg/dL (ref 8–23)
CO2: 24 mmol/L (ref 22–32)
Calcium: 10.1 mg/dL (ref 8.9–10.3)
Chloride: 101 mmol/L (ref 98–111)
Creatinine, Ser: 0.69 mg/dL (ref 0.44–1.00)
GFR, Estimated: >60 mL/min (ref >60)
Glucose, Bld: 125 mg/dL — ABNORMAL HIGH (ref 70–99)
Potassium: 4 mmol/L (ref 3.5–5.1)
Sodium: 135 mmol/L (ref 135–145)

## 2023-11-08 MED ORDER — HYDRALAZINE HCL 20 MG/ML IJ SOLN: 5.0000 mg | INTRAMUSCULAR | Status: DC | PRN

## 2023-11-08 MED ORDER — FENTANYL CITRATE PF 50 MCG/ML IJ SOSY
50.0000 ug | PREFILLED_SYRINGE | Freq: Once | INTRAMUSCULAR | Status: AC
  Administered 2023-11-08: 50 ug via INTRAVENOUS
  Filled 2023-11-08: qty 1

## 2023-11-08 NOTE — Assessment & Plan Note (Signed)
Per EDMD please reach out to orthopedic MD , anticipate surgery tomorrow in am will keep patient  NPO. Pain control.  MIVF.

## 2023-11-08 NOTE — H&P (Signed)
History and Physical    Patient: Mary Mosley OZD:[id number] DOB: 11/20/1941 DOA: 11/08/2023 DOS: the patient was seen and examined on 11/09/2023 PCP: Malva Limes, MD  Patient coming from: Home  Chief Complaint  Patient presents with   Fall   HPI: Mary Mosley is a 82 y.o. female with medical history significant for essential hypertension, allergies to multiple medications including amlodipine, diltiazem, Fosamax, gabapentin, HCTZ, penicillin coming today for fall at home in her garden on her right side, patient did strike her head on the right side, but no loss of consciousness and had a laceration on arrival to the ED.  Patient has intermittent dizziness history in the past as well.  Patient is put in a cervical collar.  Initial vitals showed elevated blood pressures O2 sats 96% on room air blood sugar of 116.  Patient has been reporting double vision.  In emergency room vitals trend shows: Vitals:   11/08/23 2009 11/08/23 2030 11/08/23 2100 11/08/23 2200  BP: (!) 183/99 (!) 177/95 (!) 185/91 (!) 161/87  Pulse: (!) 106 99 (!) 110 95  Temp: 98.8 F (37.1 C)  98.7 F (37.1 C) 98.7 F (37.1 C)  Resp: 16  17 17   Height:      Weight:      SpO2: 96% 97% 98% 95%  TempSrc: Oral     BMI (Calculated):       >>Labs are notable for : Normal metabolic panel with a glucose of 125 and a BMI of 18.17. CBC with white count of 10.8 normal hemoglobin of 13.5 platelets of 168. Chest x-ray done today shows essentially clear lungs mild atelectasis, and left lower lobe, no pleural effusion or pneumothorax.  In the ED pt received: Medications  hydrALAZINE (APRESOLINE) injection 5 mg (has no administration in time range)  HYDROcodone-acetaminophen (NORCO/VICODIN) 5-325 MG per tablet 1-2 tablet (has no administration in time range)  morphine (PF) 2 MG/ML injection 2 mg (has no administration in time range)  methocarbamol (ROBAXIN) tablet 500 mg (has no administration in time range)    Or   methocarbamol (ROBAXIN) injection 500 mg (has no administration in time range)  polyethylene glycol (MIRALAX / GLYCOLAX) packet 17 g (has no administration in time range)  bisacodyl (DULCOLAX) EC tablet 5 mg (has no administration in time range)  fentaNYL (SUBLIMAZE) injection 50 mcg (50 mcg Intravenous Given 11/08/23 2221)   Review of Systems  Musculoskeletal:  Positive for falls and joint pain.  Neurological:  Positive for dizziness and weakness.   Past Medical History:  Diagnosis Date   Hypertension    OSA (obstructive sleep apnea)    pt denies   Osteopenia    Past Surgical History:  Procedure Laterality Date   CATARACT EXTRACTION W/PHACO Right 06/07/2021   Procedure: CATARACT EXTRACTION PHACO AND INTRAOCULAR LENS PLACEMENT (IOC) RIGHT;  Surgeon: Nevada Crane, MD;  Location: [facility];  Service: Ophthalmology;  Laterality: Right;  8.45 00:50.7   CATARACT EXTRACTION W/PHACO Left 06/21/2021   Procedure: CATARACT EXTRACTION PHACO AND INTRAOCULAR LENS PLACEMENT (IOC) LEFT 7.54 00:51.0;  Surgeon: Nevada Crane, MD;  Location: [facility];  Service: Ophthalmology;  Laterality: Left;   EYE SURGERY Bilateral    due to trauma   TONSILLECTOMY AND ADENOIDECTOMY      reports that she has never smoked. She has never used smokeless tobacco. She reports that she does not drink alcohol and does not use drugs.  Allergies  Allergen Reactions   Amlodipine Itching  Diltiazem Hcl Itching   Fosamax [Alendronate Sodium] Itching   Gabapentin    Hydrochlorothiazide Itching   Penicillins Hives    Family History  Problem Relation Age of Onset   Heart attack Mother    Lung cancer Brother    Heart Problems Brother    Heart Problems Brother        Has stents   Liver cancer Son    Breast cancer Neg Hx     Prior to Admission medications   Medication Sig Start Date End Date Taking? Authorizing Provider  cholecalciferol (VITAMIN D) 1000 UNITS tablet Take 1 tablet by  mouth daily.    [provider]  MAXITROL 3.5-10000-0.1 OINT  12/01/22   [provider]  naproxen sodium (ALEVE) 220 MG tablet Take 220 mg by mouth daily as needed. Back and muscle pain 24 hr tablet    [provider]  traZODone (DESYREL) 50 MG tablet Take 1-2 tablets (50-100 mg total) by mouth at bedtime. 06/19/23   Malva Limes, MD     Vitals:   11/08/23 2009 11/08/23 2030 11/08/23 2100 11/08/23 2200  BP: (!) 183/99 (!) 177/95 (!) 185/91 (!) 161/87  Pulse: (!) 106 99 (!) 110 95  Resp: 16  17 17   Temp: 98.8 F (37.1 C)  98.7 F (37.1 C) 98.7 F (37.1 C)  TempSrc: Oral     SpO2: 96% 97% 98% 95%  Weight:      Height:       Physical Exam Vitals and nursing note reviewed.  Constitutional:      General: She is not in acute distress. HENT:     Head: Normocephalic and atraumatic.     Right Ear: Hearing normal.     Left Ear: Hearing normal.     Nose: Nose normal. No nasal deformity.     Mouth/Throat:     Lips: Pink.     Tongue: No lesions.     Pharynx: Oropharynx is clear.  Eyes:     General: Lids are normal.     Extraocular Movements: Extraocular movements intact.  Cardiovascular:     Rate and Rhythm: Normal rate and regular rhythm.     Heart sounds: Normal heart sounds.  Pulmonary:     Effort: Pulmonary effort is normal.     Breath sounds: Normal breath sounds.  Abdominal:     General: Bowel sounds are normal. There is no distension.     Palpations: Abdomen is soft. There is no mass.     Tenderness: There is no abdominal tenderness.  Musculoskeletal:     Right lower leg: No edema.     Left lower leg: No edema.  Skin:    General: Skin is warm.  Neurological:     General: No focal deficit present.     Mental Status: She is alert and oriented to person, place, and time.     Cranial Nerves: Cranial nerves 2-12 are intact.  Psychiatric:        Attention and Perception: Attention normal.        Mood and Affect: Mood normal.        Speech:  Speech normal.        Behavior: Behavior normal. Behavior is cooperative.      Labs on Admission: I have personally reviewed following labs and imaging studies Results for orders placed or performed during the hospital encounter of 11/08/23 (from the past 24 hours)  CBC with Differential     Status: Abnormal  Collection Time: 11/08/23  9:22 PM  Result Value Ref Range   WBC 10.8 (H) 4.0 - 10.5 K/uL   RBC 4.29 3.87 - 5.11 MIL/uL   Hemoglobin 13.5 12.0 - 15.0 g/dL   HCT 28.4 13.2 - 44.0 %   MCV 94.2 80.0 - 100.0 fL   MCH 31.5 26.0 - 34.0 pg   MCHC 33.4 30.0 - 36.0 g/dL   RDW 10.2 72.5 - 36.6 %   Platelets 168 150 - 400 K/uL   nRBC 0.0 0.0 - 0.2 %   Neutrophils Relative % 91 %   Neutro Abs 9.7 (H) 1.7 - 7.7 K/uL   Lymphocytes Relative 4 %   Lymphs Abs 0.4 (L) 0.7 - 4.0 K/uL   Monocytes Relative 5 %   Monocytes Absolute 0.6 0.1 - 1.0 K/uL   Eosinophils Relative 0 %   Eosinophils Absolute 0.0 0.0 - 0.5 K/uL   Basophils Relative 0 %   Basophils Absolute 0.0 0.0 - 0.1 K/uL   Immature Granulocytes 0 %   Abs Immature Granulocytes 0.04 0.00 - 0.07 K/uL  Basic metabolic panel     Status: Abnormal   Collection Time: 11/08/23  9:22 PM  Result Value Ref Range   Sodium 135 135 - 145 mmol/L   Potassium 4.0 3.5 - 5.1 mmol/L   Chloride 101 98 - 111 mmol/L   CO2 24 22 - 32 mmol/L   Glucose, Bld 125 (H) 70 - 99 mg/dL   BUN 18 8 - 23 mg/dL   Creatinine, Ser 4.40 0.44 - 1.00 mg/dL   Calcium 34.7 8.9 - 42.5 mg/dL   GFR, Estimated >95 >63 mL/min   Anion gap 10 5 - 15    CBC: Recent Labs  Lab 11/08/23 2122  WBC 10.8*  NEUTROABS 9.7*  HGB 13.5  HCT 40.4  MCV 94.2  PLT 168   Basic Metabolic Panel: Recent Labs  Lab 11/08/23 2122  NA 135  K 4.0  CL 101  CO2 24  GLUCOSE 125*  BUN 18  CREATININE 0.69  CALCIUM 10.1   Unresulted Labs (From admission, onward)     Start     Ordered   11/09/23 0500  CBC  Tomorrow morning,   R        11/09/23 0009   11/09/23 0500  Basic  metabolic panel  Tomorrow morning,   R        11/09/23 0009   11/09/23 0148  Brain natriuretic peptide  Once,   R        11/09/23 0147   11/09/23 0145  Hepatic function panel  Add-on,   AD        11/09/23 0144   11/09/23 0145  T4, free  Add-on,   AD        11/09/23 0144   11/09/23 0145  TSH  Add-on,   AD        11/09/23 0144   11/09/23 0145  Vitamin B12  Add-on,   AD        11/09/23 0144   11/09/23 0009  Type and screen [facility]  Once,   R       Comments: [facility]    11/09/23 0009   11/08/23 2249  Urinalysis, Complete w Microscopic -Urine, Random  Once,   URGENT       Question:  Specimen Source  Answer:  Urine, Random   11/08/23 2248  Medications  hydrALAZINE (APRESOLINE) injection 5 mg (has no administration in time range)  HYDROcodone-acetaminophen (NORCO/VICODIN) 5-325 MG per tablet 1-2 tablet (has no administration in time range)  morphine (PF) 2 MG/ML injection 2 mg (has no administration in time range)  methocarbamol (ROBAXIN) tablet 500 mg (has no administration in time range)    Or  methocarbamol (ROBAXIN) injection 500 mg (has no administration in time range)  polyethylene glycol (MIRALAX / GLYCOLAX) packet 17 g (has no administration in time range)  bisacodyl (DULCOLAX) EC tablet 5 mg (has no administration in time range)  fentaNYL (SUBLIMAZE) injection 50 mcg (50 mcg Intravenous Given 11/08/23 2221)    Radiological Exams on Admission: CT Hip Right Wo Contrast Result Date: 11/08/2023 CLINICAL DATA:  Fracture evaluation.  Pain after fall EXAM: CT OF THE RIGHT HIP WITHOUT CONTRAST TECHNIQUE: Multidetector CT imaging of the right hip was performed according to the standard protocol. Multiplanar CT image reconstructions were also generated. RADIATION DOSE REDUCTION: This exam was performed according to the departmental dose-optimization program which includes automated exposure control, adjustment of the mA  and/or kV according to patient size and/or use of iterative reconstruction technique. COMPARISON:  Radiographs earlier today FINDINGS: Bones/Joint/Cartilage Subcapital fracture of the right femoral neck. The femoral shaft is displaced superiorly and laterally. The femoral head remains seated in the acetabulum. Ligaments Suboptimally assessed by CT. Muscles and Tendons No acute abnormality. Soft tissues Unremarkable. IMPRESSION: Subcapital fracture of the right femoral neck. Electronically Signed   By: Minerva Fester M.D.   On: 11/08/2023 22:42   DG Chest Portable 1 View Result Date: 11/08/2023 CLINICAL DATA:  Fall, preop EXAM: PORTABLE CHEST 1 VIEW COMPARISON:  None available. FINDINGS: Lungs are essentially clear. Mild linear scarring/atelectasis in the left lower lobe. No pleural effusion or pneumothorax. The heart is normal in size.  Thoracic aortic atherosclerosis. IMPRESSION: No acute cardiopulmonary disease. Electronically Signed   By: Charline Bills M.D.   On: 11/08/2023 22:15   DG Hip Unilat With Pelvis 2-3 Views Right Result Date: 11/08/2023 CLINICAL DATA:  Fall, pain EXAM: DG HIP (WITH OR WITHOUT PELVIS) 2-3V RIGHT COMPARISON:  None Available. FINDINGS: Angulated, impacted transcervical fracture of the right femoral neck. No other displaced fractures of the pelvis or proximal left femur seen in single frontal view. Nonobstructive pattern of overlying bowel gas. IMPRESSION: Angulated, impacted transcervical fracture of the right femoral neck. Electronically Signed   By: Jearld Lesch M.D.   On: 11/08/2023 19:18   CT Head Wo Contrast Result Date: 11/08/2023 CLINICAL DATA:  Head trauma, moderate-severe; Neck trauma (Age >= 65y) EXAM: CT HEAD WITHOUT CONTRAST CT CERVICAL SPINE WITHOUT CONTRAST TECHNIQUE: Multidetector CT imaging of the head and cervical spine was performed following the standard protocol without intravenous contrast. Multiplanar CT image reconstructions of the cervical spine  were also generated. RADIATION DOSE REDUCTION: This exam was performed according to the departmental dose-optimization program which includes automated exposure control, adjustment of the mA and/or kV according to patient size and/or use of iterative reconstruction technique. COMPARISON:  CT head and CT cervical spine Apr 13, 2022. FINDINGS: CT HEAD FINDINGS Brain: No evidence of acute infarction, hemorrhage, hydrocephalus, extra-axial collection or mass lesion/mass effect. Vascular: No hyperdense vessel or unexpected calcification. Skull: No acute fracture.  Right scalp contusion. Sinuses/Orbits: Clear sinuses.  No acute orbital findings. CT CERVICAL SPINE FINDINGS Alignment: No substantial sagittal subluxation. Skull base and vertebrae: No acute fracture. No primary bone lesion or focal pathologic process. Soft tissues and spinal canal: No  prevertebral fluid or swelling. No visible canal hematoma. Disc levels:  Mild for age multilevel bony degenerative change. Upper chest: Visualized lung apices are clear. Other: Heterogeneous enlarged thyroid. IMPRESSION: 1. No evidence of acute abnormality intracranially or in the cervical spine. 2. Right scalp contusion without fracture. 3. Heterogeneous enlarged thyroid. Recommend thyroid US (ref: J Am Coll Radiol. 2015 Feb;12(2): 143-50). Electronically Signed   By: Feliberto Harts M.D.   On: 11/08/2023 18:21   CT Cervical Spine Wo Contrast Result Date: 11/08/2023 CLINICAL DATA:  Head trauma, moderate-severe; Neck trauma (Age >= 65y) EXAM: CT HEAD WITHOUT CONTRAST CT CERVICAL SPINE WITHOUT CONTRAST TECHNIQUE: Multidetector CT imaging of the head and cervical spine was performed following the standard protocol without intravenous contrast. Multiplanar CT image reconstructions of the cervical spine were also generated. RADIATION DOSE REDUCTION: This exam was performed according to the departmental dose-optimization program which includes automated exposure control,  adjustment of the mA and/or kV according to patient size and/or use of iterative reconstruction technique. COMPARISON:  CT head and CT cervical spine Apr 13, 2022. FINDINGS: CT HEAD FINDINGS Brain: No evidence of acute infarction, hemorrhage, hydrocephalus, extra-axial collection or mass lesion/mass effect. Vascular: No hyperdense vessel or unexpected calcification. Skull: No acute fracture.  Right scalp contusion. Sinuses/Orbits: Clear sinuses.  No acute orbital findings. CT CERVICAL SPINE FINDINGS Alignment: No substantial sagittal subluxation. Skull base and vertebrae: No acute fracture. No primary bone lesion or focal pathologic process. Soft tissues and spinal canal: No prevertebral fluid or swelling. No visible canal hematoma. Disc levels:  Mild for age multilevel bony degenerative change. Upper chest: Visualized lung apices are clear. Other: Heterogeneous enlarged thyroid. IMPRESSION: 1. No evidence of acute abnormality intracranially or in the cervical spine. 2. Right scalp contusion without fracture. 3. Heterogeneous enlarged thyroid. Recommend thyroid US (ref: J Am Coll Radiol. 2015 Feb;12(2): 143-50). Electronically Signed   By: Feliberto Harts M.D.   On: 11/08/2023 18:21     Data Reviewed: Relevant notes from primary care and specialist visits, past discharge summaries as available in EHR, including Care Everywhere. Prior diagnostic testing as pertinent to current admission diagnoses Updated medications and problem lists for reconciliation ED course, including vitals, labs, imaging, treatment and response to treatment Triage notes, nursing and pharmacy notes and ED provider's notes Notable results as noted in HPI  Assessment and Plan: * Fall at home, initial encounter Patient's fall and history of dizziness has to be evaluated in detail and followed to identify the exact etiology.  While here in the hospital we will certainly monitor patient's cardiac status telemetry for any  dysrhythmias, attempt to  obtain orthostatic blood pressures once patient is postop if she is able to tolerate sitting up, certainly her blood pressure medications could be contributing to this. Will also defer to patient's primary care to continue evaluation for other evaluation for falls and possible syncope.  aspiration precaution.  Femur fracture, right [facility]) Per EDMD please reach out to orthopedic MD , anticipate surgery tomorrow in am will keep patient  NPO. Pain control.  MIVF.     Dizziness We will obtain: TFT.  Orthostatic vitals.  Monitor cbc.  Troponin. Bnp.  Hydrate pt for any component of dehydration from either decreased po intake or otherwise.    Primary hypertension Patient is not on any home medications which I suspect may be secondary to her history of dizziness and falls possibly.  While in house however we will start patient on low dose hydralazine with systolic goals of  140's.    Prognosis: Stable.   DVT prophylaxis:  SCD's   Consults:  Orthopedic: Dr. Steward Drone.  Advance Care Planning:    Code Status: Full Code   Family Communication:  None.   Disposition Plan:  STR.   Severity of Illness: The appropriate patient status for this patient is OBSERVATION. Observation status is judged to be reasonable and necessary in order to provide the required intensity of service to ensure the patient's safety. The patient's presenting symptoms, physical exam findings, and initial radiographic and laboratory data in the context of their medical condition is felt to place them at decreased risk for further clinical deterioration. Furthermore, it is anticipated that the patient will be medically stable for discharge from the hospital within 2 midnights of admission.   Author: Gertha Calkin, MD 11/09/2023 1:49 AM  For on call review [email or web address].

## 2023-11-08 NOTE — Assessment & Plan Note (Signed)
We will obtain: TFT.  Orthostatic vitals.  Monitor cbc.  Troponin. Bnp.  Hydrate pt for any component of dehydration from either decreased po intake or otherwise.

## 2023-11-08 NOTE — H&P (Incomplete)
History and Physical    Patient: Mary Mosley ZOX:[id number] DOB: 02-07-1941 DOA: 11/08/2023 DOS: the patient was seen and examined on 11/08/2023 PCP: Malva Limes, MD  Patient coming from: Home  Chief Complaint  Patient presents with  . Fall   HPI: Mary Mosley is a 82 y.o. female with medical history significant for essential hypertension, allergies to multiple medications including amlodipine, diltiazem, Fosamax, gabapentin, HCTZ, penicillin coming today for fall at home in her garden on her right side, patient did strike her head on the right side, but no loss of consciousness and had a laceration on arrival to the ED.  Patient has intermittent dizziness history in the past as well.  Patient is put in a cervical collar.  Initial vitals showed elevated blood pressures O2 sats 96% on room air blood sugar of 116.  Patient has been reporting double vision.  In emergency room vitals trend shows: Vitals:   11/08/23 2009 11/08/23 2030 11/08/23 2100 11/08/23 2200  BP: (!) 183/99 (!) 177/95 (!) 185/91 (!) 161/87  Pulse: (!) 106 99 (!) 110 95  Temp: 98.8 F (37.1 C)  98.7 F (37.1 C) 98.7 F (37.1 C)  Resp: 16  17 17   Height:      Weight:      SpO2: 96% 97% 98% 95%  TempSrc: Oral     BMI (Calculated):       >>Labs are notable for : Normal metabolic panel with a glucose of 125 and a BMI of 18.17. CBC with white count of 10.8 normal hemoglobin of 13.5 platelets of 168. Chest x-ray done today shows essentially clear lungs mild atelectasis, and left lower lobe, no pleural effusion or pneumothorax.  In the ED pt received: Medications  hydrALAZINE (APRESOLINE) injection 5 mg (has no administration in time range)  fentaNYL (SUBLIMAZE) injection 50 mcg (50 mcg Intravenous Given 11/08/23 2221)   Review of Systems  Musculoskeletal:  Positive for falls and joint pain.  Neurological:  Positive for dizziness and weakness.   Past Medical History:  Diagnosis Date  . Hypertension    . OSA (obstructive sleep apnea)    pt denies  . Osteopenia    Past Surgical History:  Procedure Laterality Date  . CATARACT EXTRACTION W/PHACO Right 06/07/2021   Procedure: CATARACT EXTRACTION PHACO AND INTRAOCULAR LENS PLACEMENT (IOC) RIGHT;  Surgeon: Nevada Crane, MD;  Location: [facility];  Service: Ophthalmology;  Laterality: Right;  8.45 00:50.7  . CATARACT EXTRACTION W/PHACO Left 06/21/2021   Procedure: CATARACT EXTRACTION PHACO AND INTRAOCULAR LENS PLACEMENT (IOC) LEFT 7.54 00:51.0;  Surgeon: Nevada Crane, MD;  Location: [facility];  Service: Ophthalmology;  Laterality: Left;  . EYE SURGERY Bilateral    due to trauma  . TONSILLECTOMY AND ADENOIDECTOMY      reports that she has never smoked. She has never used smokeless tobacco. She reports that she does not drink alcohol and does not use drugs.  Allergies  Allergen Reactions  . Amlodipine Itching  . Diltiazem Hcl Itching  . Fosamax [Alendronate Sodium] Itching  . Gabapentin   . Hydrochlorothiazide Itching  . Penicillins Hives    Family History  Problem Relation Age of Onset  . Heart attack Mother   . Lung cancer Brother   . Heart Problems Brother   . Heart Problems Brother        Has stents  . Liver cancer Son   . Breast cancer Neg Hx     Prior to Admission medications  Medication Sig Start Date End Date Taking? Authorizing Provider  cholecalciferol (VITAMIN D) 1000 UNITS tablet Take 1 tablet by mouth daily.    [provider]  MAXITROL 3.5-10000-0.1 OINT  12/01/22   [provider]  naproxen sodium (ALEVE) 220 MG tablet Take 220 mg by mouth daily as needed. Back and muscle pain 24 hr tablet    [provider]  traZODone (DESYREL) 50 MG tablet Take 1-2 tablets (50-100 mg total) by mouth at bedtime. 06/19/23   Malva Limes, MD     Vitals:   11/08/23 2009 11/08/23 2030 11/08/23 2100 11/08/23 2200  BP: (!) 183/99 (!) 177/95 (!) 185/91 (!) 161/87  Pulse:  (!) 106 99 (!) 110 95  Resp: 16  17 17   Temp: 98.8 F (37.1 C)  98.7 F (37.1 C) 98.7 F (37.1 C)  TempSrc: Oral     SpO2: 96% 97% 98% 95%  Weight:      Height:       Physical Exam Vitals and nursing note reviewed.  Constitutional:      General: She is not in acute distress. HENT:     Head: Normocephalic and atraumatic.     Right Ear: Hearing normal.     Left Ear: Hearing normal.     Nose: Nose normal. No nasal deformity.     Mouth/Throat:     Lips: Pink.     Tongue: No lesions.     Pharynx: Oropharynx is clear.  Eyes:     General: Lids are normal.     Extraocular Movements: Extraocular movements intact.  Cardiovascular:     Rate and Rhythm: Normal rate and regular rhythm.     Heart sounds: Normal heart sounds.  Pulmonary:     Effort: Pulmonary effort is normal.     Breath sounds: Normal breath sounds.  Abdominal:     General: Bowel sounds are normal. There is no distension.     Palpations: Abdomen is soft. There is no mass.     Tenderness: There is no abdominal tenderness.  Musculoskeletal:     Right lower leg: No edema.     Left lower leg: No edema.  Skin:    General: Skin is warm.  Neurological:     General: No focal deficit present.     Mental Status: She is alert and oriented to person, place, and time.     Cranial Nerves: Cranial nerves 2-12 are intact.  Psychiatric:        Attention and Perception: Attention normal.        Mood and Affect: Mood normal.        Speech: Speech normal.        Behavior: Behavior normal. Behavior is cooperative.      Labs on Admission: I have personally reviewed following labs and imaging studies Results for orders placed or performed during the hospital encounter of 11/08/23 (from the past 24 hours)  CBC with Differential     Status: Abnormal   Collection Time: 11/08/23  9:22 PM  Result Value Ref Range   WBC 10.8 (H) 4.0 - 10.5 K/uL   RBC 4.29 3.87 - 5.11 MIL/uL   Hemoglobin 13.5 12.0 - 15.0 g/dL   HCT 41.3 24.4 - 01.0  %   MCV 94.2 80.0 - 100.0 fL   MCH 31.5 26.0 - 34.0 pg   MCHC 33.4 30.0 - 36.0 g/dL   RDW 27.2 53.6 - 64.4 %   Platelets 168 150 - 400 K/uL  nRBC 0.0 0.0 - 0.2 %   Neutrophils Relative % 91 %   Neutro Abs 9.7 (H) 1.7 - 7.7 K/uL   Lymphocytes Relative 4 %   Lymphs Abs 0.4 (L) 0.7 - 4.0 K/uL   Monocytes Relative 5 %   Monocytes Absolute 0.6 0.1 - 1.0 K/uL   Eosinophils Relative 0 %   Eosinophils Absolute 0.0 0.0 - 0.5 K/uL   Basophils Relative 0 %   Basophils Absolute 0.0 0.0 - 0.1 K/uL   Immature Granulocytes 0 %   Abs Immature Granulocytes 0.04 0.00 - 0.07 K/uL  Basic metabolic panel     Status: Abnormal   Collection Time: 11/08/23  9:22 PM  Result Value Ref Range   Sodium 135 135 - 145 mmol/L   Potassium 4.0 3.5 - 5.1 mmol/L   Chloride 101 98 - 111 mmol/L   CO2 24 22 - 32 mmol/L   Glucose, Bld 125 (H) 70 - 99 mg/dL   BUN 18 8 - 23 mg/dL   Creatinine, Ser 4.69 0.44 - 1.00 mg/dL   Calcium 62.9 8.9 - 52.8 mg/dL   GFR, Estimated >41 >32 mL/min   Anion gap 10 5 - 15    CBC: Recent Labs  Lab 11/08/23 2122  WBC 10.8*  NEUTROABS 9.7*  HGB 13.5  HCT 40.4  MCV 94.2  PLT 168   Basic Metabolic Panel: Recent Labs  Lab 11/08/23 2122  NA 135  K 4.0  CL 101  CO2 24  GLUCOSE 125*  BUN 18  CREATININE 0.69  CALCIUM 10.1   Unresulted Labs (From admission, onward)     Start     Ordered   11/08/23 2249  Urinalysis, Complete w Microscopic -Urine, Random  Once,   URGENT       Question:  Specimen Source  Answer:  Urine, Random   11/08/23 2248            Medications  hydrALAZINE (APRESOLINE) injection 5 mg (has no administration in time range)  fentaNYL (SUBLIMAZE) injection 50 mcg (50 mcg Intravenous Given 11/08/23 2221)    Radiological Exams on Admission: CT Hip Right Wo Contrast Result Date: 11/08/2023 CLINICAL DATA:  Fracture evaluation.  Pain after fall EXAM: CT OF THE RIGHT HIP WITHOUT CONTRAST TECHNIQUE: Multidetector CT imaging of the right hip was  performed according to the standard protocol. Multiplanar CT image reconstructions were also generated. RADIATION DOSE REDUCTION: This exam was performed according to the departmental dose-optimization program which includes automated exposure control, adjustment of the mA and/or kV according to patient size and/or use of iterative reconstruction technique. COMPARISON:  Radiographs earlier today FINDINGS: Bones/Joint/Cartilage Subcapital fracture of the right femoral neck. The femoral shaft is displaced superiorly and laterally. The femoral head remains seated in the acetabulum. Ligaments Suboptimally assessed by CT. Muscles and Tendons No acute abnormality. Soft tissues Unremarkable. IMPRESSION: Subcapital fracture of the right femoral neck. Electronically Signed   By: Minerva Fester M.D.   On: 11/08/2023 22:42   DG Chest Portable 1 View Result Date: 11/08/2023 CLINICAL DATA:  Fall, preop EXAM: PORTABLE CHEST 1 VIEW COMPARISON:  None available. FINDINGS: Lungs are essentially clear. Mild linear scarring/atelectasis in the left lower lobe. No pleural effusion or pneumothorax. The heart is normal in size.  Thoracic aortic atherosclerosis. IMPRESSION: No acute cardiopulmonary disease. Electronically Signed   By: Charline Bills M.D.   On: 11/08/2023 22:15   DG Hip Unilat With Pelvis 2-3 Views Right Result Date: 11/08/2023 CLINICAL DATA:  Fall, pain  EXAM: DG HIP (WITH OR WITHOUT PELVIS) 2-3V RIGHT COMPARISON:  None Available. FINDINGS: Angulated, impacted transcervical fracture of the right femoral neck. No other displaced fractures of the pelvis or proximal left femur seen in single frontal view. Nonobstructive pattern of overlying bowel gas. IMPRESSION: Angulated, impacted transcervical fracture of the right femoral neck. Electronically Signed   By: Jearld Lesch M.D.   On: 11/08/2023 19:18   CT Head Wo Contrast Result Date: 11/08/2023 CLINICAL DATA:  Head trauma, moderate-severe; Neck trauma (Age >=  65y) EXAM: CT HEAD WITHOUT CONTRAST CT CERVICAL SPINE WITHOUT CONTRAST TECHNIQUE: Multidetector CT imaging of the head and cervical spine was performed following the standard protocol without intravenous contrast. Multiplanar CT image reconstructions of the cervical spine were also generated. RADIATION DOSE REDUCTION: This exam was performed according to the departmental dose-optimization program which includes automated exposure control, adjustment of the mA and/or kV according to patient size and/or use of iterative reconstruction technique. COMPARISON:  CT head and CT cervical spine Apr 13, 2022. FINDINGS: CT HEAD FINDINGS Brain: No evidence of acute infarction, hemorrhage, hydrocephalus, extra-axial collection or mass lesion/mass effect. Vascular: No hyperdense vessel or unexpected calcification. Skull: No acute fracture.  Right scalp contusion. Sinuses/Orbits: Clear sinuses.  No acute orbital findings. CT CERVICAL SPINE FINDINGS Alignment: No substantial sagittal subluxation. Skull base and vertebrae: No acute fracture. No primary bone lesion or focal pathologic process. Soft tissues and spinal canal: No prevertebral fluid or swelling. No visible canal hematoma. Disc levels:  Mild for age multilevel bony degenerative change. Upper chest: Visualized lung apices are clear. Other: Heterogeneous enlarged thyroid. IMPRESSION: 1. No evidence of acute abnormality intracranially or in the cervical spine. 2. Right scalp contusion without fracture. 3. Heterogeneous enlarged thyroid. Recommend thyroid US (ref: J Am Coll Radiol. 2015 Feb;12(2): 143-50). Electronically Signed   By: Feliberto Harts M.D.   On: 11/08/2023 18:21   CT Cervical Spine Wo Contrast Result Date: 11/08/2023 CLINICAL DATA:  Head trauma, moderate-severe; Neck trauma (Age >= 65y) EXAM: CT HEAD WITHOUT CONTRAST CT CERVICAL SPINE WITHOUT CONTRAST TECHNIQUE: Multidetector CT imaging of the head and cervical spine was performed following the standard  protocol without intravenous contrast. Multiplanar CT image reconstructions of the cervical spine were also generated. RADIATION DOSE REDUCTION: This exam was performed according to the departmental dose-optimization program which includes automated exposure control, adjustment of the mA and/or kV according to patient size and/or use of iterative reconstruction technique. COMPARISON:  CT head and CT cervical spine Apr 13, 2022. FINDINGS: CT HEAD FINDINGS Brain: No evidence of acute infarction, hemorrhage, hydrocephalus, extra-axial collection or mass lesion/mass effect. Vascular: No hyperdense vessel or unexpected calcification. Skull: No acute fracture.  Right scalp contusion. Sinuses/Orbits: Clear sinuses.  No acute orbital findings. CT CERVICAL SPINE FINDINGS Alignment: No substantial sagittal subluxation. Skull base and vertebrae: No acute fracture. No primary bone lesion or focal pathologic process. Soft tissues and spinal canal: No prevertebral fluid or swelling. No visible canal hematoma. Disc levels:  Mild for age multilevel bony degenerative change. Upper chest: Visualized lung apices are clear. Other: Heterogeneous enlarged thyroid. IMPRESSION: 1. No evidence of acute abnormality intracranially or in the cervical spine. 2. Right scalp contusion without fracture. 3. Heterogeneous enlarged thyroid. Recommend thyroid US (ref: J Am Coll Radiol. 2015 Feb;12(2): 143-50). Electronically Signed   By: Feliberto Harts M.D.   On: 11/08/2023 18:21     Data Reviewed: Relevant notes from primary care and specialist visits, past discharge summaries as available in EHR, including  Care Everywhere. Prior diagnostic testing as pertinent to current admission diagnoses Updated medications and problem lists for reconciliation ED course, including vitals, labs, imaging, treatment and response to treatment Triage notes, nursing and pharmacy notes and ED provider's notes Notable results as noted in HPI  Assessment  and Plan: * Fall at home, initial encounter Patient's fall and history of dizziness has to be evaluated in detail and followed to identify the exact etiology.  While here in the hospital we will certainly monitor patient's cardiac status telemetry for any dysrhythmias, attempt to  obtain orthostatic blood pressures once patient is postop if she is able to tolerate sitting up, certainly her blood pressure medications could be contributing to this. Will also defer to patient's primary care to continue evaluation for other evaluation for falls and possible syncope.  Precaution aspiration precaution.  Femur fracture, right [facility]) Per EDMD please reach out to orthopedic MD , anticipate surgery tomorrow in am will keep patient  NPO.  Dizziness We will obtain: TFT.  Orthostatic vitals.  Monitor cbc.  Troponin. Bnp.  Hydrate pt for any component of dehydration from either decreased po intake or otherwise.   Primary hypertension Patient is not on any home medications which I suspect may be secondary to her history of dizziness and falls possibly.  While in house however we will start patient on low dose hydralazine with systolic goals of 140's.    Prognosis: Stable.   DVT prophylaxis:  SCD's   Consults:  Orthopedic: Dr. Steward Drone.  Advance Care Planning:    Code Status: Not on file   Family Communication:  None.   Disposition Plan:  STR.   Severity of Illness: The appropriate patient status for this patient is OBSERVATION. Observation status is judged to be reasonable and necessary in order to provide the required intensity of service to ensure the patient's safety. The patient's presenting symptoms, physical exam findings, and initial radiographic and laboratory data in the context of their medical condition is felt to place them at decreased risk for further clinical deterioration. Furthermore, it is anticipated that the patient will be medically stable for discharge from the hospital  within 2 midnights of admission.   Author: Gertha Calkin, MD 11/08/2023 11:47 PM  For on call review [email or web address].

## 2023-11-08 NOTE — ED Notes (Signed)
Pt comes via [facility] from home with c/o trip[ and fall in driveway. Pt had vertigo episode. Pt in c collar, hip pain and lac to head.   180/90 100 CBG 116 96% RA  Pt not on thinners

## 2023-11-08 NOTE — ED Provider Triage Note (Signed)
Emergency Medicine Provider Triage Evaluation Note  Mary Mosley , a 82 y.o. female  was evaluated in triage.  Pt complains of fall.  She reports that she lost her balance and fell backwards and struck her head on the concrete.  Has a laceration.  No LOC.  Reports that she also hit her right hip, has not attempted to ambulate.  Not anticoagulated.  No vomiting.  Review of Systems  Positive: Headache, neck pain, hip pain Negative: Vomiting  Physical Exam  BP (!) 174/78   Pulse 100   Temp 97.8 F (36.6 C) (Oral)   Resp 20   Ht 5\' 7"  (1.702 m)   Wt 52.6 kg   SpO2 97%   BMI 18.17 kg/m  Gen:   Awake, no distress   Resp:  Normal effort  MSK:   Moves extremities without difficulty  Other:  In c collar  Medical Decision Making  Medically screening exam initiated at 5:26 PM.  Appropriate orders placed.  Mary Mosley was informed that the remainder of the evaluation will be completed by another provider, this initial triage assessment does not replace that evaluation, and the importance of remaining in the ED until their evaluation is complete.     Mary Hoehn, PA-C 11/08/23 1727

## 2023-11-08 NOTE — ED Provider Notes (Signed)
[facility] Provider Note    Event Date/Time   First MD Initiated Contact with Patient 11/08/23 2024     (approximate)   History   Fall   HPI  Mary Mosley is a 82 y.o. female who presents to the emergency department today after suffering a fall.  The patient states she was out in her garden.  She is bending down when she stood up she felt a little dizzy and fell onto her right side.  She says that this happens to her frequently where she will get dizzy.  She says it is due to her double vision.  The patient is complaining primarily of pain in the right hip.  Also hit the right side of her head.     Physical Exam   Triage Vital Signs: ED Triage Vitals  Encounter Vitals Group     BP 11/08/23 1724 (!) 174/78     Systolic BP Percentile --      Diastolic BP Percentile --      Pulse Rate 11/08/23 1724 100     Resp 11/08/23 1724 20     Temp 11/08/23 1724 97.8 F (36.6 C)     Temp Source 11/08/23 1724 Oral     SpO2 11/08/23 1724 97 %     Weight 11/08/23 1725 116 lb (52.6 kg)     Height 11/08/23 1725 5\' 7"  (1.702 m)     Head Circumference --      Peak Flow --      Pain Score 11/08/23 1724 6     Pain Loc --      Pain Education --      Exclude from Growth Chart --     Most recent vital signs: Vitals:   11/08/23 1724 11/08/23 2009  BP: (!) 174/78 (!) 183/99  Pulse: 100 (!) 106  Resp: 20 16  Temp: 97.8 F (36.6 C) 98.8 F (37.1 C)  SpO2: 97% 96%   General: Awake, alert, oriented. CV:  Good peripheral perfusion. Regular rate and rhythm. Resp:  Normal effort. Lungs clear. Abd:  No distention.  Other:  Abrasion to right scalp. Right hip with tenderness to manipulation, NV intact distally.   ED Results / Procedures / Treatments   Labs (all labs ordered are listed, but only abnormal results are displayed) Labs Reviewed  CBC WITH DIFFERENTIAL/PLATELET - Abnormal; Notable for the following components:      Result Value   WBC 10.8 (*)     Neutro Abs 9.7 (*)    Lymphs Abs 0.4 (*)    All other components within normal limits  BASIC METABOLIC PANEL - Abnormal; Notable for the following components:   Glucose, Bld 125 (*)    All other components within normal limits     EKG I, Phineas Semen, attending physician, personally viewed and interpreted this EKG  EKG Time: 2120 Rate: 99 Rhythm: sinus rhythm Axis: rightward axis Intervals: qtc 487 QRS: RBBB, LAFB ST changes: no st elevation Impression: abnormal ekg    RADIOLOGY I independently interpreted and visualized the right hip. My interpretation: femoral neck fracture Radiology interpretation:  IMPRESSION:  Angulated, impacted transcervical fracture of the right femoral  neck.    I independently interpreted and visualized the ct head/cervical spine. My interpretation: No ICH. No fracture Radiology interpretation:  IMPRESSION:  1. No evidence of acute abnormality intracranially or in the  cervical spine.  2. Right scalp contusion without fracture.  3. Heterogeneous enlarged thyroid. Recommend thyroid  US (ref: J Am  Coll Radiol. 2015 Feb;12(2): 143-50).     PROCEDURES:  Critical Care performed: No MEDICATIONS ORDERED IN ED: Medications - No data to display   IMPRESSION / MDM / ASSESSMENT AND PLAN / ED COURSE  I reviewed the triage vital signs and the nursing notes.                              Differential diagnosis includes, but is not limited to, ICH, concussion, hip fracture, contusion  Patient's presentation is most consistent with acute presentation with potential threat to life or bodily function.  Patient presents to the emergency department today after suffering a fall.  Patient complaining of right hip pain.  On exam does have some tenderness of the right hip although is neurovascularly intact.  Also does have an abrasion to her right scalp.  CT head and cervical spine without concerning traumatic injury.  Right hip however is consistent  with femoral neck fracture. Discussed with Dr. Steward Drone with orthopedics, did request CT of hip as well. Plan is for patient to go to OR tomorrow. Discussed with Dr. Allena Katz with the hospitalist service who will evaluate for admission.   FINAL CLINICAL IMPRESSION(S) / ED DIAGNOSES   Final diagnoses:  Fall, initial encounter  Closed fracture of right hip, initial encounter [facility])        Note:  This document was prepared using Dragon voice recognition software and may include unintentional dictation errors.    Phineas Semen, MD 11/08/23 2204

## 2023-11-08 NOTE — Assessment & Plan Note (Signed)
Patient is not on any home medications which I suspect may be secondary to her history of dizziness and falls possibly.  While in house however we will start patient on low dose hydralazine with systolic goals of 140's.

## 2023-11-08 NOTE — Assessment & Plan Note (Signed)
Patient's fall and history of dizziness has to be evaluated in detail and followed to identify the exact etiology.  While here in the hospital we will certainly monitor patient's cardiac status telemetry for any dysrhythmias, attempt to  obtain orthostatic blood pressures once patient is postop if she is able to tolerate sitting up, certainly her blood pressure medications could be contributing to this. Will also defer to patient's primary care to continue evaluation for other evaluation for falls and possible syncope.  Precaution aspiration precaution.

## 2023-11-08 NOTE — ED Triage Notes (Signed)
Patient states she tripped and fell in her garden hitting head on concrete; denies blood thinners, no LOC. Also complaining of right hip pain.

## 2023-11-09 ENCOUNTER — Other Ambulatory Visit: Payer: Self-pay

## 2023-11-09 ENCOUNTER — Encounter
Admission: EM | Disposition: A | Discharge status: Home Health | Payer: Self-pay | Source: Home / Self Care | Attending: Internal Medicine

## 2023-11-09 ENCOUNTER — Inpatient Hospital Stay: Payer: Medicare Other | Admitting: Anesthesiology

## 2023-11-09 ENCOUNTER — Inpatient Hospital Stay: Payer: Medicare Other

## 2023-11-09 DIAGNOSIS — E860 Dehydration: Secondary | ICD-10-CM | POA: Diagnosis present

## 2023-11-09 DIAGNOSIS — E440 Moderate protein-calorie malnutrition: Secondary | ICD-10-CM | POA: Diagnosis present

## 2023-11-09 DIAGNOSIS — Z8249 Family history of ischemic heart disease and other diseases of the circulatory system: Secondary | ICD-10-CM | POA: Diagnosis not present

## 2023-11-09 DIAGNOSIS — Z79899 Other long term (current) drug therapy: Secondary | ICD-10-CM | POA: Diagnosis not present

## 2023-11-09 DIAGNOSIS — I10 Essential (primary) hypertension: Secondary | ICD-10-CM | POA: Diagnosis not present

## 2023-11-09 DIAGNOSIS — Z96641 Presence of right artificial hip joint: Secondary | ICD-10-CM | POA: Diagnosis not present

## 2023-11-09 DIAGNOSIS — G4733 Obstructive sleep apnea (adult) (pediatric): Secondary | ICD-10-CM | POA: Diagnosis present

## 2023-11-09 DIAGNOSIS — M8580 Other specified disorders of bone density and structure, unspecified site: Secondary | ICD-10-CM | POA: Diagnosis present

## 2023-11-09 DIAGNOSIS — M25551 Pain in right hip: Secondary | ICD-10-CM | POA: Diagnosis present

## 2023-11-09 DIAGNOSIS — Z681 Body mass index (BMI) 19 or less, adult: Secondary | ICD-10-CM | POA: Diagnosis not present

## 2023-11-09 DIAGNOSIS — Z880 Allergy status to penicillin: Secondary | ICD-10-CM | POA: Diagnosis not present

## 2023-11-09 DIAGNOSIS — Z888 Allergy status to other drugs, medicaments and biological substances status: Secondary | ICD-10-CM | POA: Diagnosis not present

## 2023-11-09 DIAGNOSIS — H532 Diplopia: Secondary | ICD-10-CM | POA: Diagnosis present

## 2023-11-09 DIAGNOSIS — Y92009 Unspecified place in unspecified non-institutional (private) residence as the place of occurrence of the external cause: Secondary | ICD-10-CM | POA: Diagnosis not present

## 2023-11-09 DIAGNOSIS — S72001A Fracture of unspecified part of neck of right femur, initial encounter for closed fracture: Secondary | ICD-10-CM | POA: Diagnosis not present

## 2023-11-09 DIAGNOSIS — W19XXXA Unspecified fall, initial encounter: Secondary | ICD-10-CM | POA: Diagnosis not present

## 2023-11-09 DIAGNOSIS — W010XXA Fall on same level from slipping, tripping and stumbling without subsequent striking against object, initial encounter: Secondary | ICD-10-CM | POA: Diagnosis present

## 2023-11-09 DIAGNOSIS — Y92007 Garden or yard of unspecified non-institutional (private) residence as the place of occurrence of the external cause: Secondary | ICD-10-CM | POA: Diagnosis not present

## 2023-11-09 HISTORY — PX: HIP ARTHROPLASTY: SHX981

## 2023-11-09 LAB — HEMOGLOBIN AND HEMATOCRIT, BLOOD
HCT: 38 % (ref 36.0–46.0)
Hemoglobin: 12.6 g/dL (ref 12.0–15.0)

## 2023-11-09 LAB — TSH: TSH: 0.515 u[IU]/mL (ref 0.350–4.500)

## 2023-11-09 LAB — URINALYSIS, COMPLETE (UACMP) WITH MICROSCOPIC
Bacteria, UA: NONE SEEN
Bilirubin Urine: NEGATIVE
Glucose, UA: NEGATIVE mg/dL
Hgb urine dipstick: NEGATIVE
Ketones, ur: 5 mg/dL — AB
Leukocytes,Ua: NEGATIVE
Nitrite: NEGATIVE
Protein, ur: NEGATIVE mg/dL
Specific Gravity, Urine: 1.009 (ref 1.005–1.030)
Squamous Epithelial / HPF: 0 /[HPF] (ref 0–5)
pH: 7 (ref 5.0–8.0)

## 2023-11-09 LAB — BASIC METABOLIC PANEL
Anion gap: 9 (ref 5–15)
BUN: 15 mg/dL (ref 8–23)
CO2: 25 mmol/L (ref 22–32)
Calcium: 9.7 mg/dL (ref 8.9–10.3)
Chloride: 101 mmol/L (ref 98–111)
Creatinine, Ser: 0.71 mg/dL (ref 0.44–1.00)
GFR, Estimated: >60 mL/min (ref >60)
Glucose, Bld: 132 mg/dL — ABNORMAL HIGH (ref 70–99)
Potassium: 3.7 mmol/L (ref 3.5–5.1)
Sodium: 135 mmol/L (ref 135–145)

## 2023-11-09 LAB — TROPONIN I (HIGH SENSITIVITY): Troponin I (High Sensitivity): 318 ng/L (ref <18)

## 2023-11-09 LAB — CBC
HCT: 40.6 % (ref 36.0–46.0)
Hemoglobin: 13.6 g/dL (ref 12.0–15.0)
MCH: 30.6 pg (ref 26.0–34.0)
MCHC: 33.5 g/dL (ref 30.0–36.0)
MCV: 91.4 fL (ref 80.0–100.0)
Platelets: 154 10*3/uL (ref 150–400)
RBC: 4.44 MIL/uL (ref 3.87–5.11)
RDW: 13.2 % (ref 11.5–15.5)
WBC: 8.4 10*3/uL (ref 4.0–10.5)
nRBC: 0 % (ref 0.0–0.2)

## 2023-11-09 LAB — HEPATIC FUNCTION PANEL
ALT: 21 U/L (ref 0–44)
AST: 29 U/L (ref 15–41)
Albumin: 4.7 g/dL (ref 3.5–5.0)
Alkaline Phosphatase: 56 U/L (ref 38–126)
Bilirubin, Direct: 0.1 mg/dL (ref 0.0–0.2)
Indirect Bilirubin: 0.9 mg/dL (ref 0.3–0.9)
Total Bilirubin: 1 mg/dL (ref <1.2)
Total Protein: 7.3 g/dL (ref 6.5–8.1)

## 2023-11-09 LAB — BRAIN NATRIURETIC PEPTIDE: B Natriuretic Peptide: 413 pg/mL — ABNORMAL HIGH (ref 0.0–100.0)

## 2023-11-09 LAB — TYPE AND SCREEN
ABO/RH(D): O POS
Antibody Screen: NEGATIVE

## 2023-11-09 LAB — VITAMIN B12: Vitamin B-12: 617 pg/mL (ref 180–914)

## 2023-11-09 LAB — T4, FREE: Free T4: 0.94 ng/dL (ref 0.61–1.12)

## 2023-11-09 SURGERY — HEMIARTHROPLASTY, HIP, DIRECT ANTERIOR APPROACH, FOR FRACTURE
Anesthesia: General | Site: Hip | Laterality: Right

## 2023-11-09 MED ORDER — CEFAZOLIN SODIUM-DEXTROSE 2-4 GM/100ML-% IV SOLN
2.0000 g | Freq: Three times a day (TID) | INTRAVENOUS | Status: DC
Start: 2023-11-09 — End: 2023-11-10
  Administered 2023-11-09 – 2023-11-10 (×2): 2 g via INTRAVENOUS
  Filled 2023-11-09 (×2): qty 100

## 2023-11-09 MED ORDER — ALBUMIN HUMAN 5 % IV SOLN
INTRAVENOUS | Status: AC
  Filled 2023-11-09: qty 500

## 2023-11-09 MED ORDER — METHOCARBAMOL 500 MG PO TABS: 500.0000 mg | ORAL_TABLET | Freq: Four times a day (QID) | ORAL | Status: DC | PRN

## 2023-11-09 MED ORDER — VANCOMYCIN HCL 1000 MG IV SOLR
INTRAVENOUS | Status: AC
  Filled 2023-11-09: qty 20

## 2023-11-09 MED ORDER — EPHEDRINE SULFATE-NACL 50-0.9 MG/10ML-% IV SOSY
PREFILLED_SYRINGE | INTRAVENOUS | Status: DC | PRN
  Administered 2023-11-09: 5 mg via INTRAVENOUS

## 2023-11-09 MED ORDER — FENTANYL CITRATE (PF) 100 MCG/2ML IJ SOLN
INTRAMUSCULAR | Status: DC | PRN
  Administered 2023-11-09 (×2): 50 ug via INTRAVENOUS

## 2023-11-09 MED ORDER — OXYCODONE HCL 5 MG PO TABS: 5.0000 mg | ORAL_TABLET | Freq: Once | ORAL | Status: DC | PRN

## 2023-11-09 MED ORDER — CEFAZOLIN SODIUM-DEXTROSE 2-4 GM/100ML-% IV SOLN
INTRAVENOUS | Status: AC
  Filled 2023-11-09: qty 100

## 2023-11-09 MED ORDER — TRAZODONE HCL 50 MG PO TABS
50.0000 mg | ORAL_TABLET | Freq: Every day | ORAL | Status: DC
Start: 2023-11-09 — End: 2023-11-11
  Filled 2023-11-09: qty 2
  Filled 2023-11-09: qty 1

## 2023-11-09 MED ORDER — CEFAZOLIN SODIUM-DEXTROSE 2-4 GM/100ML-% IV SOLN
2.0000 g | INTRAVENOUS | Status: AC
  Administered 2023-11-09: 2 g via INTRAVENOUS

## 2023-11-09 MED ORDER — SUCCINYLCHOLINE CHLORIDE 200 MG/10ML IV SOSY
PREFILLED_SYRINGE | INTRAVENOUS | Status: DC | PRN
  Administered 2023-11-09: 80 mg via INTRAVENOUS

## 2023-11-09 MED ORDER — LACTATED RINGERS IV SOLN: INTRAVENOUS | Status: DC | PRN

## 2023-11-09 MED ORDER — 0.9 % SODIUM CHLORIDE (POUR BTL) OPTIME
TOPICAL | Status: DC | PRN
  Administered 2023-11-09: 500 mL

## 2023-11-09 MED ORDER — BISACODYL 5 MG PO TBEC: 5.0000 mg | DELAYED_RELEASE_TABLET | Freq: Every day | ORAL | Status: DC | PRN

## 2023-11-09 MED ORDER — HYDROCODONE-ACETAMINOPHEN 5-325 MG PO TABS
1.0000 | ORAL_TABLET | Freq: Four times a day (QID) | ORAL | Status: DC | PRN
  Administered 2023-11-10: 1 via ORAL
  Filled 2023-11-09: qty 1

## 2023-11-09 MED ORDER — DIPHENHYDRAMINE HCL 25 MG PO CAPS: 25.0000 mg | ORAL_CAPSULE | Freq: Four times a day (QID) | ORAL | Status: DC | PRN

## 2023-11-09 MED ORDER — LIDOCAINE HCL (CARDIAC) PF 100 MG/5ML IV SOSY
PREFILLED_SYRINGE | INTRAVENOUS | Status: DC | PRN
  Administered 2023-11-09: 80 mg via INTRAVENOUS

## 2023-11-09 MED ORDER — ONDANSETRON HCL 4 MG/2ML IJ SOLN
INTRAMUSCULAR | Status: DC | PRN
  Administered 2023-11-09 (×2): 4 mg via INTRAVENOUS

## 2023-11-09 MED ORDER — POLYETHYLENE GLYCOL 3350 17 G PO PACK: 17.0000 g | PACK | Freq: Every day | ORAL | Status: DC | PRN

## 2023-11-09 MED ORDER — GLYCOPYRROLATE 0.2 MG/ML IJ SOLN
INTRAMUSCULAR | Status: DC | PRN
  Administered 2023-11-09: .2 mg via INTRAVENOUS

## 2023-11-09 MED ORDER — FENTANYL CITRATE (PF) 100 MCG/2ML IJ SOLN
INTRAMUSCULAR | Status: AC
  Filled 2023-11-09: qty 2

## 2023-11-09 MED ORDER — VANCOMYCIN HCL 1 G IV SOLR
INTRAVENOUS | Status: DC | PRN
  Administered 2023-11-09: 1000 mg

## 2023-11-09 MED ORDER — CHLORHEXIDINE GLUCONATE 0.12 % MT SOLN
OROMUCOSAL | Status: AC
  Filled 2023-11-09: qty 15

## 2023-11-09 MED ORDER — ALBUMIN HUMAN 5 % IV SOLN
INTRAVENOUS | Status: AC
  Filled 2023-11-09: qty 250

## 2023-11-09 MED ORDER — OXYCODONE HCL 5 MG/5ML PO SOLN
5.0000 mg | Freq: Once | ORAL | Status: DC | PRN
Start: 2023-11-09 — End: 2023-11-09

## 2023-11-09 MED ORDER — SUGAMMADEX SODIUM 200 MG/2ML IV SOLN
INTRAVENOUS | Status: DC | PRN
  Administered 2023-11-09: 150 mg via INTRAVENOUS

## 2023-11-09 MED ORDER — METHOCARBAMOL 1000 MG/10ML IJ SOLN: 500.0000 mg | Freq: Four times a day (QID) | INTRAMUSCULAR | Status: DC | PRN

## 2023-11-09 MED ORDER — FENTANYL CITRATE (PF) 100 MCG/2ML IJ SOLN: 25.0000 ug | INTRAMUSCULAR | Status: DC | PRN

## 2023-11-09 MED ORDER — BUPIVACAINE-EPINEPHRINE (PF) 0.5% -1:200000 IJ SOLN
INTRAMUSCULAR | Status: DC | PRN
  Administered 2023-11-09: 20 mL

## 2023-11-09 MED ORDER — ACETAMINOPHEN 10 MG/ML IV SOLN
INTRAVENOUS | Status: AC
  Filled 2023-11-09: qty 100

## 2023-11-09 MED ORDER — DEXAMETHASONE SODIUM PHOSPHATE 10 MG/ML IJ SOLN
INTRAMUSCULAR | Status: DC | PRN
  Administered 2023-11-09: 10 mg via INTRAVENOUS

## 2023-11-09 MED ORDER — ROCURONIUM BROMIDE 100 MG/10ML IV SOLN
INTRAVENOUS | Status: DC | PRN
  Administered 2023-11-09: 10 mg via INTRAVENOUS
  Administered 2023-11-09: 40 mg via INTRAVENOUS
  Administered 2023-11-09: 20 mg via INTRAVENOUS

## 2023-11-09 MED ORDER — PHENYLEPHRINE 80 MCG/ML (10ML) SYRINGE FOR IV PUSH (FOR BLOOD PRESSURE SUPPORT)
PREFILLED_SYRINGE | INTRAVENOUS | Status: DC | PRN
  Administered 2023-11-09 (×3): 80 ug via INTRAVENOUS
  Administered 2023-11-09: 160 ug via INTRAVENOUS
  Administered 2023-11-09: 80 ug via INTRAVENOUS

## 2023-11-09 MED ORDER — MORPHINE SULFATE (PF) 2 MG/ML IV SOLN
2.0000 mg | INTRAVENOUS | Status: DC | PRN
  Administered 2023-11-09 (×2): 2 mg via INTRAVENOUS
  Filled 2023-11-09 (×2): qty 1

## 2023-11-09 MED ORDER — TRANEXAMIC ACID-NACL 1000-0.7 MG/100ML-% IV SOLN
1000.0000 mg | INTRAVENOUS | Status: AC
  Administered 2023-11-09: 1000 mg via INTRAVENOUS

## 2023-11-09 MED ORDER — SODIUM CHLORIDE 0.9 % IR SOLN
Status: DC | PRN
  Administered 2023-11-09: 3000 mL

## 2023-11-09 MED ORDER — CHLORHEXIDINE GLUCONATE 0.12 % MT SOLN
15.0000 mL | Freq: Once | OROMUCOSAL | Status: AC
  Administered 2023-11-09: 15 mL via OROMUCOSAL

## 2023-11-09 MED ORDER — TRANEXAMIC ACID-NACL 1000-0.7 MG/100ML-% IV SOLN
INTRAVENOUS | Status: AC
  Filled 2023-11-09: qty 100

## 2023-11-09 MED ORDER — ACETAMINOPHEN 10 MG/ML IV SOLN
INTRAVENOUS | Status: DC | PRN
  Administered 2023-11-09: 1000 mg via INTRAVENOUS

## 2023-11-09 MED ORDER — BUPIVACAINE-EPINEPHRINE (PF) 0.5% -1:200000 IJ SOLN
INTRAMUSCULAR | Status: AC
  Filled 2023-11-09: qty 20

## 2023-11-09 MED ORDER — ALBUMIN HUMAN 5 % IV SOLN: INTRAVENOUS | Status: DC | PRN

## 2023-11-09 MED ORDER — PROPOFOL 10 MG/ML IV BOLUS
INTRAVENOUS | Status: DC | PRN
  Administered 2023-11-09: 100 mg via INTRAVENOUS

## 2023-11-09 MED ORDER — ALBUMIN HUMAN 5 % IV SOLN
25.0000 g | Freq: Once | INTRAVENOUS | Status: AC
  Administered 2023-11-09: 25 g via INTRAVENOUS

## 2023-11-09 MED ORDER — PHENYLEPHRINE HCL-NACL 20-0.9 MG/250ML-% IV SOLN
INTRAVENOUS | Status: AC
  Filled 2023-11-09: qty 250

## 2023-11-09 MED ORDER — DIPHENHYDRAMINE HCL 50 MG/ML IJ SOLN
25.0000 mg | Freq: Once | INTRAMUSCULAR | Status: AC
Start: 2023-11-09 — End: 2023-11-09
  Administered 2023-11-09: 25 mg via INTRAVENOUS

## 2023-11-09 MED ORDER — VASOPRESSIN 20 UNIT/ML IV SOLN
INTRAVENOUS | Status: DC | PRN
  Administered 2023-11-09 (×3): 2 [IU] via INTRAVENOUS

## 2023-11-09 SURGICAL SUPPLY — 57 items
BALL HIP ARTICU 28 +5 (Hips) IMPLANT
BIPOLAR DEPUY 51 (Hips) ×1 IMPLANT
BLADE SAGITTAL 25.0X1.19X90 (BLADE) IMPLANT
BRUSH FEMORAL CANAL (MISCELLANEOUS) IMPLANT
CHLORAPREP W/TINT 26 (MISCELLANEOUS) ×1 IMPLANT
COVER LIGHT HANDLE STERIS (MISCELLANEOUS) ×2 IMPLANT
DRAPE SURG 17X23 STRL (DRAPES) ×1 IMPLANT
DRAPE U-SHAPE 47X51 STRL (DRAPES) ×1 IMPLANT
DRSG AQUACEL AG ADV 3.5X10 (GAUZE/BANDAGES/DRESSINGS) ×1 IMPLANT
DRSG AQUACEL AG ADV 3.5X14 (GAUZE/BANDAGES/DRESSINGS) ×1 IMPLANT
DRSG MEPILEX SACRM 8.7X9.8 (GAUZE/BANDAGES/DRESSINGS) ×1 IMPLANT
ELECT REM PT RETURN 9FT ADLT (ELECTROSURGICAL) ×1 IMPLANT
ELECTRODE REM PT RTRN 9FT ADLT (ELECTROSURGICAL) ×1 IMPLANT
GAUZE XEROFORM 1X8 LF (GAUZE/BANDAGES/DRESSINGS) IMPLANT
GLOVE BIO SURGEON STRL SZ8 (GLOVE) ×4 IMPLANT
GLOVE BIOGEL PI IND STRL 7.0 (GLOVE) ×2 IMPLANT
GLOVE SRG 8 PF TXTR STRL LF DI (GLOVE) ×1 IMPLANT
GOWN STRL REUS W/ TWL LRG LVL3 (GOWN DISPOSABLE) ×2 IMPLANT
GOWN STRL REUS W/ TWL XL LVL3 (GOWN DISPOSABLE) ×1 IMPLANT
HANDLE YANKAUER SUCT BULB TIP (MISCELLANEOUS) ×1 IMPLANT
HANDLE YANKAUER SUCT OPEN TIP (MISCELLANEOUS) IMPLANT
HEAD BIPOLAR DEPUY 51 (Hips) IMPLANT
HIP BALL ARTICU 28 +5 (Hips) ×1 IMPLANT
IV NS IRRIG 3000ML ARTHROMATIC (IV SOLUTION) ×1 IMPLANT
KIT TURNOVER KIT A (KITS) ×1 IMPLANT
MANIFOLD NEPTUNE II (INSTRUMENTS) ×1 IMPLANT
MARKER SKIN DUAL TIP RULER LAB (MISCELLANEOUS) ×1 IMPLANT
NDL HYPO 18GX1.5 BLUNT FILL (NEEDLE) ×1 IMPLANT
NDL HYPO 21X1.5 SAFETY (NEEDLE) ×1 IMPLANT
NDL MAYO 6 CRC TAPER PT (NEEDLE) IMPLANT
NEEDLE HYPO 18GX1.5 BLUNT FILL (NEEDLE) ×1 IMPLANT
NEEDLE HYPO 21X1.5 SAFETY (NEEDLE) ×1 IMPLANT
NEEDLE MAYO 6 CRC TAPER PT (NEEDLE) ×1 IMPLANT
NS IRRIG 1000ML POUR BTL (IV SOLUTION) ×1 IMPLANT
NS IRRIG 500ML POUR BTL (IV SOLUTION) IMPLANT
PACK BASIN MINOR ARMC (MISCELLANEOUS) ×1 IMPLANT
PACK HIP PROSTHESIS (MISCELLANEOUS) ×1 IMPLANT
PAD ARMBOARD 7.5X6 YLW CONV (MISCELLANEOUS) ×1 IMPLANT
PASSER SUT SWANSON 36MM LOOP (INSTRUMENTS) IMPLANT
PILLOW ABDUCTION FOAM SM (MISCELLANEOUS) IMPLANT
PIN STMN SNGL STERILE 9X3.6MM (PIN) IMPLANT
POSITIONER HEAD 8X9X4 ADT (SOFTGOODS) ×1 IMPLANT
PRESSURIZER FEM CANAL M (MISCELLANEOUS) IMPLANT
SET HNDPC FAN SPRY TIP SCT (DISPOSABLE) ×1 IMPLANT
SPIKE FLUID TRANSFER (MISCELLANEOUS) ×1 IMPLANT
STAPLER SKIN PROX 35W (STAPLE) IMPLANT
STEM FEMORAL SZ 6MM STD ACTIS (Stem) IMPLANT
STRIP CLOSURE SKIN 1/2X4 (GAUZE/BANDAGES/DRESSINGS) ×2 IMPLANT
SUT MNCRL AB 4-0 PS2 18 (SUTURE) ×2 IMPLANT
SUT MON AB 2-0 CT1 36 (SUTURE) ×1 IMPLANT
SUT VIC AB 1 CT1 36 (SUTURE) ×4 IMPLANT
SYR 30ML LL (SYRINGE) ×2 IMPLANT
SYR BULB IRRIG 60ML STRL (SYRINGE) ×2 IMPLANT
TOWER CARTRIDGE SMART MIX (DISPOSABLE) IMPLANT
TRAY FOLEY MTR SLVR 16FR STAT (SET/KITS/TRAYS/PACK) ×1 IMPLANT
TUBING CONNECTING 10 (TUBING) ×1 IMPLANT
WATER STERILE IRR 1000ML POUR (IV SOLUTION) ×2 IMPLANT

## 2023-11-09 NOTE — Progress Notes (Signed)
PROGRESS NOTE    Mary Mosley  GEX:[id number] DOB: 1941-02-03 DOA: 11/08/2023 PCP: Malva Limes, MD    Brief Narrative:   82 y.o. female with medical history significant for essential hypertension, allergies to multiple medications including amlodipine, diltiazem, Fosamax, gabapentin, HCTZ, penicillin coming today for fall at home in her garden on her right side, patient did strike her head on the right side, but no loss of consciousness and had a laceration on arrival to the ED.  Patient has intermittent dizziness history in the past as well.    Assessment & Plan:   Principal Problem:   Fall at home, initial encounter Active Problems:   Femur fracture, right (HCC)   Dizziness   Primary hypertension  Right-sided femur fracture Mechanical fall versus syncope Patient apparently fell at home when gardening.  She states she is she was bent over for an extended period of time and then rose up too quickly.  As a result she did experience dizziness.  Unclear whether she truly lost consciousness.  Suspect orthostasis and at least presyncopal event. Plan: N.p.o. for or today with orthopedics Pain control PT OT to start postoperative day #1 Hold chemoprophylaxis until least postoperative day #1  Essential hypertension Patient does not appear to be on any home medications Blood pressure elevated but may be secondary to pain IV hydralazine as needed for now   DVT prophylaxis: SCD Code Status: Full Family Communication: None Disposition Plan: Status is: Inpatient Remains inpatient appropriate because: Hip fracture   Level of care: Telemetry Medical  Consultants:  Orthopedics  Procedures:  Hip fracture repair  Antimicrobials: None   Subjective: Seen and Ament.  Resting in bed.  No visible distress  Objective: Vitals:   11/09/23 1000 11/09/23 1030 11/09/23 1100 11/09/23 1130  BP: (!) 155/82 (!) 155/78 (!) 149/83 (!) 156/78  Pulse: 76 76 70 66  Resp: 15 15 14  (!)  21  Temp:      TempSrc:      SpO2: 98% 98% 98% 98%  Weight:      Height:       No intake or output data in the 24 hours ending 11/09/23 1148 Filed Weights   11/08/23 1725  Weight: 52.6 kg    Examination:  General exam: Appears calm and comfortable  Respiratory system: Clear to auscultation. Respiratory effort normal. Cardiovascular system: S1-S2, RRR, no murmurs, no pedal edema Gastrointestinal system: Thin, soft, NT/ND, normal bowel sounds Central nervous system: Alert and oriented. No focal neurological deficits. Extremities: Right hip tender to palpation Skin: No rashes, lesions or ulcers Psychiatry: Judgement and insight appear normal. Mood & affect appropriate.     Data Reviewed: I have personally reviewed following labs and imaging studies  CBC: Recent Labs  Lab 11/08/23 2122 11/09/23 0540  WBC 10.8* 8.4  NEUTROABS 9.7*  --   HGB 13.5 13.6  HCT 40.4 40.6  MCV 94.2 91.4  PLT 168 154   Basic Metabolic Panel: Recent Labs  Lab 11/08/23 2122 11/09/23 0540  NA 135 135  K 4.0 3.7  CL 101 101  CO2 24 25  GLUCOSE 125* 132*  BUN 18 15  CREATININE 0.69 0.71  CALCIUM 10.1 9.7   GFR: Estimated Creatinine Clearance: 43.5 mL/min (by C-G formula based on SCr of 0.71 mg/dL). Liver Function Tests: Recent Labs  Lab 11/09/23 0540  AST 29  ALT 21  ALKPHOS 56  BILITOT 1.0  PROT 7.3  ALBUMIN 4.7   No results for input(s): "LIPASE", "AMYLASE"  in the last 168 hours. No results for input(s): "AMMONIA" in the last 168 hours. Coagulation Profile: No results for input(s): "INR", "PROTIME" in the last 168 hours. Cardiac Enzymes: No results for input(s): "CKTOTAL", "CKMB", "CKMBINDEX", "TROPONINI" in the last 168 hours. BNP (last 3 results) No results for input(s): "PROBNP" in the last 8760 hours. HbA1C: No results for input(s): "HGBA1C" in the last 72 hours. CBG: No results for input(s): "GLUCAP" in the last 168 hours. Lipid Profile: No results for input(s):  "CHOL", "HDL", "LDLCALC", "TRIG", "CHOLHDL", "LDLDIRECT" in the last 72 hours. Thyroid Function Tests: Recent Labs    11/09/23 0540  TSH 0.515  FREET4 0.94   Anemia Panel: No results for input(s): "VITAMINB12", "FOLATE", "FERRITIN", "TIBC", "IRON", "RETICCTPCT" in the last 72 hours. Sepsis Labs: No results for input(s): "PROCALCITON", "LATICACIDVEN" in the last 168 hours.  No results found for this or any previous visit (from the past 240 hours).       Radiology Studies: CT Hip Right Wo Contrast Result Date: 11/08/2023 CLINICAL DATA:  Fracture evaluation.  Pain after fall EXAM: CT OF THE RIGHT HIP WITHOUT CONTRAST TECHNIQUE: Multidetector CT imaging of the right hip was performed according to the standard protocol. Multiplanar CT image reconstructions were also generated. RADIATION DOSE REDUCTION: This exam was performed according to the departmental dose-optimization program which includes automated exposure control, adjustment of the mA and/or kV according to patient size and/or use of iterative reconstruction technique. COMPARISON:  Radiographs earlier today FINDINGS: Bones/Joint/Cartilage Subcapital fracture of the right femoral neck. The femoral shaft is displaced superiorly and laterally. The femoral head remains seated in the acetabulum. Ligaments Suboptimally assessed by CT. Muscles and Tendons No acute abnormality. Soft tissues Unremarkable. IMPRESSION: Subcapital fracture of the right femoral neck. Electronically Signed   By: Minerva Fester M.D.   On: 11/08/2023 22:42   DG Chest Portable 1 View Result Date: 11/08/2023 CLINICAL DATA:  Fall, preop EXAM: PORTABLE CHEST 1 VIEW COMPARISON:  None available. FINDINGS: Lungs are essentially clear. Mild linear scarring/atelectasis in the left lower lobe. No pleural effusion or pneumothorax. The heart is normal in size.  Thoracic aortic atherosclerosis. IMPRESSION: No acute cardiopulmonary disease. Electronically Signed   By: Charline Bills M.D.   On: 11/08/2023 22:15   DG Hip Unilat With Pelvis 2-3 Views Right Result Date: 11/08/2023 CLINICAL DATA:  Fall, pain EXAM: DG HIP (WITH OR WITHOUT PELVIS) 2-3V RIGHT COMPARISON:  None Available. FINDINGS: Angulated, impacted transcervical fracture of the right femoral neck. No other displaced fractures of the pelvis or proximal left femur seen in single frontal view. Nonobstructive pattern of overlying bowel gas. IMPRESSION: Angulated, impacted transcervical fracture of the right femoral neck. Electronically Signed   By: Jearld Lesch M.D.   On: 11/08/2023 19:18   CT Head Wo Contrast Result Date: 11/08/2023 CLINICAL DATA:  Head trauma, moderate-severe; Neck trauma (Age >= 65y) EXAM: CT HEAD WITHOUT CONTRAST CT CERVICAL SPINE WITHOUT CONTRAST TECHNIQUE: Multidetector CT imaging of the head and cervical spine was performed following the standard protocol without intravenous contrast. Multiplanar CT image reconstructions of the cervical spine were also generated. RADIATION DOSE REDUCTION: This exam was performed according to the departmental dose-optimization program which includes automated exposure control, adjustment of the mA and/or kV according to patient size and/or use of iterative reconstruction technique. COMPARISON:  CT head and CT cervical spine Apr 13, 2022. FINDINGS: CT HEAD FINDINGS Brain: No evidence of acute infarction, hemorrhage, hydrocephalus, extra-axial collection or mass lesion/mass effect. Vascular: No hyperdense  vessel or unexpected calcification. Skull: No acute fracture.  Right scalp contusion. Sinuses/Orbits: Clear sinuses.  No acute orbital findings. CT CERVICAL SPINE FINDINGS Alignment: No substantial sagittal subluxation. Skull base and vertebrae: No acute fracture. No primary bone lesion or focal pathologic process. Soft tissues and spinal canal: No prevertebral fluid or swelling. No visible canal hematoma. Disc levels:  Mild for age multilevel bony degenerative  change. Upper chest: Visualized lung apices are clear. Other: Heterogeneous enlarged thyroid. IMPRESSION: 1. No evidence of acute abnormality intracranially or in the cervical spine. 2. Right scalp contusion without fracture. 3. Heterogeneous enlarged thyroid. Recommend thyroid US (ref: J Am Coll Radiol. 2015 Feb;12(2): 143-50). Electronically Signed   By: Feliberto Harts M.D.   On: 11/08/2023 18:21   CT Cervical Spine Wo Contrast Result Date: 11/08/2023 CLINICAL DATA:  Head trauma, moderate-severe; Neck trauma (Age >= 65y) EXAM: CT HEAD WITHOUT CONTRAST CT CERVICAL SPINE WITHOUT CONTRAST TECHNIQUE: Multidetector CT imaging of the head and cervical spine was performed following the standard protocol without intravenous contrast. Multiplanar CT image reconstructions of the cervical spine were also generated. RADIATION DOSE REDUCTION: This exam was performed according to the departmental dose-optimization program which includes automated exposure control, adjustment of the mA and/or kV according to patient size and/or use of iterative reconstruction technique. COMPARISON:  CT head and CT cervical spine Apr 13, 2022. FINDINGS: CT HEAD FINDINGS Brain: No evidence of acute infarction, hemorrhage, hydrocephalus, extra-axial collection or mass lesion/mass effect. Vascular: No hyperdense vessel or unexpected calcification. Skull: No acute fracture.  Right scalp contusion. Sinuses/Orbits: Clear sinuses.  No acute orbital findings. CT CERVICAL SPINE FINDINGS Alignment: No substantial sagittal subluxation. Skull base and vertebrae: No acute fracture. No primary bone lesion or focal pathologic process. Soft tissues and spinal canal: No prevertebral fluid or swelling. No visible canal hematoma. Disc levels:  Mild for age multilevel bony degenerative change. Upper chest: Visualized lung apices are clear. Other: Heterogeneous enlarged thyroid. IMPRESSION: 1. No evidence of acute abnormality intracranially or in the cervical  spine. 2. Right scalp contusion without fracture. 3. Heterogeneous enlarged thyroid. Recommend thyroid US (ref: J Am Coll Radiol. 2015 Feb;12(2): 143-50). Electronically Signed   By: Feliberto Harts M.D.   On: 11/08/2023 18:21        Scheduled Meds: Continuous Infusions:   LOS: 0 days     Tresa Moore, MD [facility]   If 7PM-7AM, please contact night-coverage  11/09/2023, 11:48 AM

## 2023-11-09 NOTE — Consult Note (Signed)
ORTHOPAEDIC CONSULTATION  REQUESTING PHYSICIAN: Tresa Moore, MD  ASSESSMENT AND PLAN: 82 y.o. female with the following: Right Hip Displaced femoral neck fracture  This patient requires inpatient admission to the hospitalist, to include preoperative clearance and perioperative medical management  - Weight Bearing Status/Activity: NWB Right lower extremity  - Additional recommended labs/tests: Preop Labs: CBC, BMP, PT/INR, Chest XR, and EKG  -VTE Prophylaxis: Please hold prior to OR; to resume POD#1 at the discretion of the primary team  - Pain control: Recommend PO pain medications PRN; judicious use of narcotics  - Follow-up plan: F/u 10-14 days postop  -Procedures: Plan for OR once patient has been medically optimized  Plan for Right Hip hemiarthroplasty  Patient is NPO.  Plan for OR 11/09/2023     Chief Complaint: Right hip pain  HPI: Mary Mosley is a 82 y.o. female who presented to the ED for evaluation after sustaining a mechanical fall.  She was working in her garden when she stood up too fast and got dizzy.  She fell and landed on her right side.  No LOC.  She did hit her head.  No numbness or tingling.  She has pain in the right hip.  She is unable to ambulate.  Pain is worse when she moves.      Past Medical History:  Diagnosis Date   Hypertension    OSA (obstructive sleep apnea)    pt denies   Osteopenia    Past Surgical History:  Procedure Laterality Date   CATARACT EXTRACTION W/PHACO Right 06/07/2021   Procedure: CATARACT EXTRACTION PHACO AND INTRAOCULAR LENS PLACEMENT (IOC) RIGHT;  Surgeon: Nevada Crane, MD;  Location: [facility];  Service: Ophthalmology;  Laterality: Right;  8.45 00:50.7   CATARACT EXTRACTION W/PHACO Left 06/21/2021   Procedure: CATARACT EXTRACTION PHACO AND INTRAOCULAR LENS PLACEMENT (IOC) LEFT 7.54 00:51.0;  Surgeon: Nevada Crane, MD;  Location: [facility];  Service: Ophthalmology;  Laterality:  Left;   EYE SURGERY Bilateral    due to trauma   TONSILLECTOMY AND ADENOIDECTOMY     Social History   Socioeconomic History   Marital status: Widowed    Spouse name: Not on file   Number of children: 2   Years of education: Not on file   Highest education level: Some college, no degree  Occupational History   Occupation: Retired  Tobacco Use   Smoking status: Never   Smokeless tobacco: Never  Vaping Use   Vaping status: Never Used  Substance and Sexual Activity   Alcohol use: No    Alcohol/week: 0.0 standard drinks of alcohol   Drug use: No   Sexual activity: Not on file  Other Topics Concern   Not on file  Social History Narrative   Not on file   Social Drivers of Health   Financial Resource Strain: Low Risk  (06/16/2023)   Overall Financial Resource Strain (CARDIA)    Difficulty of Paying Living Expenses: Not hard at all  Food Insecurity: No Food Insecurity (06/16/2023)   Hunger Vital Sign    Worried About Running Out of Food in the Last Year: Never true    Ran Out of Food in the Last Year: Never true  Transportation Needs: No Transportation Needs (06/16/2023)   PRAPARE - Administrator, Civil Service (Medical): No    Lack of Transportation (Non-Medical): No  Physical Activity: Sufficiently Active (06/16/2023)   Exercise Vital Sign    Days of Exercise per Week:  7 days    Minutes of Exercise per Session: 90 min  Stress: No Stress Concern Present (06/16/2023)   [employer] of Occupational Health - Occupational Stress Questionnaire    Feeling of Stress : Not at all  Social Connections: Moderately Integrated (06/16/2023)   Social Connection and Isolation Panel [NHANES]    Frequency of Communication with Friends and Family: More than three times a week    Frequency of Social Gatherings with Friends and Family: More than three times a week    Attends Religious Services: More than 4 times per year    Active Member of [employer] or Organizations: Yes    Attends  Banker Meetings: More than 4 times per year    Marital Status: Widowed   Family History  Problem Relation Age of Onset   Heart attack Mother    Lung cancer Brother    Heart Problems Brother    Heart Problems Brother        Has stents   Liver cancer Son    Breast cancer Neg Hx    Allergies  Allergen Reactions   Amlodipine Itching   Diltiazem Hcl Itching   Fosamax [Alendronate Sodium] Itching   Gabapentin    Hydrochlorothiazide Itching   Penicillins Hives   Prior to Admission medications   Medication Sig Start Date End Date Taking? Authorizing Provider  cholecalciferol (VITAMIN D) 1000 UNITS tablet Take 1 tablet by mouth daily.   Yes [provider]  naproxen sodium (ALEVE) 220 MG tablet Take 220 mg by mouth daily as needed. Back and muscle pain 24 hr tablet   Yes [provider]  traZODone (DESYREL) 50 MG tablet Take 1-2 tablets (50-100 mg total) by mouth at bedtime. 06/19/23  Yes Malva Limes, MD  MAXITROL 3.5-10000-0.1 OINT  12/01/22   [provider]   CT Hip Right Wo Contrast Result Date: 11/08/2023 CLINICAL DATA:  Fracture evaluation.  Pain after fall EXAM: CT OF THE RIGHT HIP WITHOUT CONTRAST TECHNIQUE: Multidetector CT imaging of the right hip was performed according to the standard protocol. Multiplanar CT image reconstructions were also generated. RADIATION DOSE REDUCTION: This exam was performed according to the departmental dose-optimization program which includes automated exposure control, adjustment of the mA and/or kV according to patient size and/or use of iterative reconstruction technique. COMPARISON:  Radiographs earlier today FINDINGS: Bones/Joint/Cartilage Subcapital fracture of the right femoral neck. The femoral shaft is displaced superiorly and laterally. The femoral head remains seated in the acetabulum. Ligaments Suboptimally assessed by CT. Muscles and Tendons No acute abnormality. Soft tissues Unremarkable.  IMPRESSION: Subcapital fracture of the right femoral neck. Electronically Signed   By: Minerva Fester M.D.   On: 11/08/2023 22:42   DG Chest Portable 1 View Result Date: 11/08/2023 CLINICAL DATA:  Fall, preop EXAM: PORTABLE CHEST 1 VIEW COMPARISON:  None available. FINDINGS: Lungs are essentially clear. Mild linear scarring/atelectasis in the left lower lobe. No pleural effusion or pneumothorax. The heart is normal in size.  Thoracic aortic atherosclerosis. IMPRESSION: No acute cardiopulmonary disease. Electronically Signed   By: Charline Bills M.D.   On: 11/08/2023 22:15   DG Hip Unilat With Pelvis 2-3 Views Right Result Date: 11/08/2023 CLINICAL DATA:  Fall, pain EXAM: DG HIP (WITH OR WITHOUT PELVIS) 2-3V RIGHT COMPARISON:  None Available. FINDINGS: Angulated, impacted transcervical fracture of the right femoral neck. No other displaced fractures of the pelvis or proximal left femur seen in single frontal view. Nonobstructive pattern of overlying bowel gas.  IMPRESSION: Angulated, impacted transcervical fracture of the right femoral neck. Electronically Signed   By: Jearld Lesch M.D.   On: 11/08/2023 19:18   CT Head Wo Contrast Result Date: 11/08/2023 CLINICAL DATA:  Head trauma, moderate-severe; Neck trauma (Age >= 65y) EXAM: CT HEAD WITHOUT CONTRAST CT CERVICAL SPINE WITHOUT CONTRAST TECHNIQUE: Multidetector CT imaging of the head and cervical spine was performed following the standard protocol without intravenous contrast. Multiplanar CT image reconstructions of the cervical spine were also generated. RADIATION DOSE REDUCTION: This exam was performed according to the departmental dose-optimization program which includes automated exposure control, adjustment of the mA and/or kV according to patient size and/or use of iterative reconstruction technique. COMPARISON:  CT head and CT cervical spine Apr 13, 2022. FINDINGS: CT HEAD FINDINGS Brain: No evidence of acute infarction, hemorrhage,  hydrocephalus, extra-axial collection or mass lesion/mass effect. Vascular: No hyperdense vessel or unexpected calcification. Skull: No acute fracture.  Right scalp contusion. Sinuses/Orbits: Clear sinuses.  No acute orbital findings. CT CERVICAL SPINE FINDINGS Alignment: No substantial sagittal subluxation. Skull base and vertebrae: No acute fracture. No primary bone lesion or focal pathologic process. Soft tissues and spinal canal: No prevertebral fluid or swelling. No visible canal hematoma. Disc levels:  Mild for age multilevel bony degenerative change. Upper chest: Visualized lung apices are clear. Other: Heterogeneous enlarged thyroid. IMPRESSION: 1. No evidence of acute abnormality intracranially or in the cervical spine. 2. Right scalp contusion without fracture. 3. Heterogeneous enlarged thyroid. Recommend thyroid US (ref: J Am Coll Radiol. 2015 Feb;12(2): 143-50). Electronically Signed   By: Feliberto Harts M.D.   On: 11/08/2023 18:21   CT Cervical Spine Wo Contrast Result Date: 11/08/2023 CLINICAL DATA:  Head trauma, moderate-severe; Neck trauma (Age >= 65y) EXAM: CT HEAD WITHOUT CONTRAST CT CERVICAL SPINE WITHOUT CONTRAST TECHNIQUE: Multidetector CT imaging of the head and cervical spine was performed following the standard protocol without intravenous contrast. Multiplanar CT image reconstructions of the cervical spine were also generated. RADIATION DOSE REDUCTION: This exam was performed according to the departmental dose-optimization program which includes automated exposure control, adjustment of the mA and/or kV according to patient size and/or use of iterative reconstruction technique. COMPARISON:  CT head and CT cervical spine Apr 13, 2022. FINDINGS: CT HEAD FINDINGS Brain: No evidence of acute infarction, hemorrhage, hydrocephalus, extra-axial collection or mass lesion/mass effect. Vascular: No hyperdense vessel or unexpected calcification. Skull: No acute fracture.  Right scalp contusion.  Sinuses/Orbits: Clear sinuses.  No acute orbital findings. CT CERVICAL SPINE FINDINGS Alignment: No substantial sagittal subluxation. Skull base and vertebrae: No acute fracture. No primary bone lesion or focal pathologic process. Soft tissues and spinal canal: No prevertebral fluid or swelling. No visible canal hematoma. Disc levels:  Mild for age multilevel bony degenerative change. Upper chest: Visualized lung apices are clear. Other: Heterogeneous enlarged thyroid. IMPRESSION: 1. No evidence of acute abnormality intracranially or in the cervical spine. 2. Right scalp contusion without fracture. 3. Heterogeneous enlarged thyroid. Recommend thyroid US (ref: J Am Coll Radiol. 2015 Feb;12(2): 143-50). Electronically Signed   By: Feliberto Harts M.D.   On: 11/08/2023 18:21   Family History Reviewed and non-contributory, no pertinent history of problems with bleeding or anesthesia    Review of Systems No fevers or chills No numbness or tingling No chest pain No shortness of breath No bowel or bladder dysfunction No GI distress No headaches    OBJECTIVE  Vitals:Patient Vitals for the past 8 hrs:  BP Temp Pulse Resp SpO2  11/09/23  0700 (!) 156/78 -- 73 20 99 %  11/09/23 0500 (!) 160/85 98.1 F (36.7 C) 76 16 95 %  11/09/23 0430 (!) 164/95 -- 85 20 96 %  11/09/23 0400 (!) 162/80 -- 85 15 96 %  11/09/23 0330 (!) 164/94 -- 81 17 96 %  11/09/23 0200 (!) 159/93 98.1 F (36.7 C) 86 17 95 %   General: Alert, no acute distress Cardiovascular: Extremities are warm Respiratory: No cyanosis, no use of accessory musculature Skin: No lesions in the area of chief complaint  Neurologic: Sensation intact distally  Psychiatric: Patient is competent for consent with normal mood and affect Lymphatic: No swelling obvious and reported other than the area involved in the exam below Extremities  RLE: Extremity held in a fixed position.  ROM deferred due to known fracture.  Sensation is intact distally  in the sural, saphenous, DP, SP, and plantar nerve distribution. 2+ DP pulse.  Toes are WWP.  Active motion intact in the TA/EHL/GS. LLE: Sensation is intact distally in the sural, saphenous, DP, SP, and plantar nerve distribution. 2+ DP pulse.  Toes are WWP.  Active motion intact in the TA/EHL/GS. Tolerates gentle ROM of the hip.  No pain with axial loading.     Test Results Imaging XR and CT scan of the right hip demonstrates a Displaced femoral neck fracture.  Labs cbc Recent Labs    11/08/23 2122 11/09/23 0540  WBC 10.8* 8.4  HGB 13.5 13.6  HCT 40.4 40.6  PLT 168 154     Recent Labs    11/08/23 2122  NA 135  K 4.0  CL 101  CO2 24  GLUCOSE 125*  BUN 18  CREATININE 0.69  CALCIUM 10.1

## 2023-11-09 NOTE — Transfer of Care (Signed)
Immediate Anesthesia Transfer of Care Note  Patient: Mary Mosley  Procedure(s) Performed: ARTHROPLASTY BIPOLAR HIP (HEMIARTHROPLASTY) (Right: Hip)  Patient Location: PACU  Anesthesia Type:General  Level of Consciousness: awake, drowsy, and patient cooperative  Airway & Oxygen Therapy: Patient Spontanous Breathing and Patient connected to face mask oxygen  Post-op Assessment: Report given to RN and Post -op Vital signs reviewed and stable  Post vital signs: Reviewed and stable  Last Vitals:  Vitals Value Taken Time  BP    Temp    Pulse    Resp    SpO2      Last Pain:  Vitals:   11/09/23 0922  TempSrc:   PainSc: 4          Complications: No notable events documented.

## 2023-11-09 NOTE — Anesthesia Preprocedure Evaluation (Signed)
Anesthesia Evaluation  Patient identified by MRN, date of birth, ID band Patient awake    Reviewed: Allergy & Precautions, NPO status , Patient's Chart, lab work & pertinent test results  History of Anesthesia Complications Negative for: history of anesthetic complications  Airway Mallampati: III  TM Distance: <3 FB Neck ROM: full    Dental  (+) Upper Dentures, Lower Dentures, Implants   Pulmonary sleep apnea    Pulmonary exam normal        Cardiovascular Exercise Tolerance: Good hypertension, Normal cardiovascular exam     Neuro/Psych negative neurological ROS  negative psych ROS   GI/Hepatic negative GI ROS, Neg liver ROS,,,  Endo/Other  negative endocrine ROS    Renal/GU      Musculoskeletal   Abdominal   Peds  Hematology negative hematology ROS (+)   Anesthesia Other Findings Past Medical History: No date: Hypertension No date: OSA (obstructive sleep apnea)     Comment:  pt denies No date: Osteopenia  Past Surgical History: 06/07/2021: CATARACT EXTRACTION W/PHACO; Right     Comment:  Procedure: CATARACT EXTRACTION PHACO AND INTRAOCULAR               LENS PLACEMENT (IOC) RIGHT;  Surgeon: Nevada Crane,              MD;  Location: [facility];  Service:               Ophthalmology;  Laterality: Right;  8.45 00:50.7 06/21/2021: CATARACT EXTRACTION W/PHACO; Left     Comment:  Procedure: CATARACT EXTRACTION PHACO AND INTRAOCULAR               LENS PLACEMENT (IOC) LEFT 7.54 00:51.0;  Surgeon: Nevada Crane, MD;  Location: [facility];                Service: Ophthalmology;  Laterality: Left; No date: EYE SURGERY; Bilateral     Comment:  due to trauma No date: TONSILLECTOMY AND ADENOIDECTOMY  BMI    Body Mass Index: 18.17 kg/m      Reproductive/Obstetrics negative OB ROS                             Anesthesia Physical Anesthesia  Plan  ASA: 3  Anesthesia Plan: General ETT   Post-op Pain Management:    Induction: Intravenous  PONV Risk Score and Plan: Ondansetron, Dexamethasone, Midazolam and Treatment may vary due to age or medical condition  Airway Management Planned: Oral ETT  Additional Equipment:   Intra-op Plan:   Post-operative Plan: Extubation in OR  Informed Consent: I have reviewed the patients History and Physical, chart, labs and discussed the procedure including the risks, benefits and alternatives for the proposed anesthesia with the patient or authorized representative who has indicated his/her understanding and acceptance.     Dental Advisory Given  Plan Discussed with: Anesthesiologist, CRNA and Surgeon  Anesthesia Plan Comments: (Patient reports that her dentures are on implants and are a problem to remove. Consented for risk of damage to them if they remain in and she voiced assent.  Patient consented for risks of anesthesia including but not limited to:  - adverse reactions to medications - damage to eyes, teeth, lips or other oral mucosa - nerve damage due to positioning  - sore throat or hoarseness - Damage to heart, brain, nerves, lungs,  other parts of body or loss of life  Patient voiced understanding and assent.)       Anesthesia Quick Evaluation

## 2023-11-09 NOTE — ED Notes (Signed)
Patient given mouth swab at this time for dry mouth. No other needs expressed.

## 2023-11-09 NOTE — Anesthesia Postprocedure Evaluation (Signed)
Anesthesia Post Note  Patient: Mary Mosley  Procedure(s) Performed: ARTHROPLASTY BIPOLAR HIP (HEMIARTHROPLASTY) (Right: Hip)  Patient location during evaluation: PACU Anesthesia Type: General Level of consciousness: awake and alert Pain management: pain level controlled Vital Signs Assessment: post-procedure vital signs reviewed and stable Respiratory status: spontaneous breathing, nonlabored ventilation, respiratory function stable and patient connected to nasal cannula oxygen Cardiovascular status: blood pressure returned to baseline and stable Postop Assessment: no apparent nausea or vomiting Anesthetic complications: no   No notable events documented.   Last Vitals:  Vitals:   11/09/23 1825 11/09/23 2012  BP: 98/63 121/60  Pulse:  96  Resp: 18 16  Temp: 36.4 C 36.6 C  SpO2: 99% 99%    Last Pain:  Vitals:   11/09/23 1754  TempSrc:   PainSc: 0-No pain                 Lenard Simmer

## 2023-11-09 NOTE — Anesthesia Procedure Notes (Signed)
Procedure Name: Intubation Date/Time: 11/09/2023 1:28 PM  Performed by: Mohammed Kindle, CRNAPre-anesthesia Checklist: Patient identified, Emergency Drugs available, Suction available and Patient being monitored Patient Re-evaluated:Patient Re-evaluated prior to induction Oxygen Delivery Method: Circle system utilized Preoxygenation: Pre-oxygenation with 100% oxygen Induction Type: IV induction Ventilation: Mask ventilation without difficulty Laryngoscope Size: McGrath and 3 Grade View: Grade I Tube type: Oral Tube size: 6.5 mm Number of attempts: 1 Airway Equipment and Method: Stylet Placement Confirmation: ETT inserted through vocal cords under direct vision, positive ETCO2 and breath sounds checked- equal and bilateral Secured at: 21 cm Tube secured with: Tape Dental Injury: Teeth and Oropharynx as per pre-operative assessment

## 2023-11-09 NOTE — Op Note (Signed)
Orthopaedic Surgery Operative Note (CSN: [id number])  [name]  1941-04-13 Date of Surgery: 11/09/2023   Diagnoses:  Displaced right femoral neck fracture  Procedure: Right hip hemiarthroplasty   Operative Finding Successful completion of the planned procedure.  Size 6 Actis stem, standard neck with +5, 52 femoral head  Post-Op Diagnosis: Same Surgeons:Primary: Oliver Barre, MD Assistants: Ed Blalock Location: [facility] OR ROOM 03 Anesthesia: General with local anesthesia Antibiotics: Ancef 2 g with local vancomycin powder 1 g at the surgical site Tourniquet time: N/A Estimated Blood Loss: 300 cc Complications: None Specimens: None  Implants: Implant Name Type Inv. Item Serial No. Manufacturer Lot No. LRB No. Used Action  HIP BALL ARTICU 28 +5 - SNA Hips HIP BALL ARTICU 28 +5 NA [employer] [id number] Right 1 Implanted    Indications for Surgery:   Mary Mosley is a 82 y.o. female who fell and sustained a right displaced femoral neck fracture.  In order to restore form and function, I recommended surgery, specifically a right hip hemiarthroplasty.  Benefits and risks of operative and nonoperative management were discussed prior to surgery with the patient  and informed consent form was completed.  Specific risks including infection, need for additional surgery, bleeding, dislocation, blood clots, poor incorporation of the implants and more severe complications associated with anesthesia were discussed.  All questions have been answered.  Consent was finalized.    Procedure:   The patient was identified properly. Informed consent was obtained and the surgical site was marked. The patient was taken up to suite where general anesthesia was induced.  The patient was positioned lateral, with the right hip up.  The right leg was prepped and draped in the usual sterile fashion.  Timeout was performed before the beginning of the case.  Ancef dosing was confirmed prior to  incision.  Patient received 1 g of TXA before the start of the case  We started by making an incision centered over the greater trochanter with a scalpel. We used the scalpel to continue to dissect to the fascia.  Electrocautery was used to help with hemostasis.  A cobb elevator was used to clear the IT band and then it was pierced with Bovie electrocautery. Mayo scissors were used to cut the fascia in a longitudinal fashion. The gluteus maximus fibers were bluntly split in line with their fibers.   The femur was slowly internally rotated, putting tension on the posterior structures. The short external rotators were identified and tagged with #1 vicryl.  Bovie electrocautery was used to dissect the short external rotators off of the insertion onto the femur.  Next, we identified the capsule and made a T capsulotomy.  The capsule was then tagged with #1 Vicryl sutures.   Next, we visualized the femoral neck fracture. We identified the sciatic nerve by palpation and verified that it was not in danger during the dissection.    We then made a neck cut approximately 1 cm above the lesser trochanter with a saw. We removed the femoral head. We used the box cutter to cut away some of the greater trochanter for ease of insertion of the stem. We then used the canal finder to locate the femoral canal.   Using the angle of the femoral neck as our guide for version, we broached sequentially. We trialed components and found appropriate fit and stability.  The patient would come to full extension of the hip. The hip was stable at 90 degrees of flexion, and 45  degrees of internal rotation.  Leg lengths were approximately equal.  We then removed the trials as well as the broach. We ensured there were no foreign bodies or bone within the acetabulum. We copiously irrigated the wound.  We measured and placed an appropriately sized cement restrictor within the canal.  Cement was mixed on the back table and when ready, cement was  inserted into the canal and pressurized.  We then carefully placed our stem.  Excess cement was removed immediately.  We maintained pressure on the stem while the cement hardened.  An appropriately sized head was placed on to the stem and the hip was reduced. Again, we noted it was stable in the previously mentioned manipulations.   We repaired the posterior capsule.  Short external rotators repaired to the greater trochanter to bone tunnels. We closed the fascia of the iliotibial band and gluteus maximus with running and intterupted Vicryl sutures. We then closed Scarpa's fascia with Vicryl sutures. Skin was closed with 2-0 monocryl and staples, an Aquacel dressing was placed.  A hip abduction pillow was secured.  Patient was awoken taken to PACU in stable condition.   Post-operative plan:  The patient will be WBAT on the operative extremity. Hip abduction pillow in place at all times when in bed Posterior hip precautions PT/OT    DVT prophylaxis per primary team, no orthopedic contraindications.  Ok to resume on POD#1 Pain control with PRN pain medication preferring oral medicines.   Follow up plan will be scheduled in approximately 14 days for incision check and XR.  January  9th in [place], any time in [place].  If DC to a nursing facility, staples can be removed around 2 weeks and I will see the patient approximately 6 weeks after surgery.  If there are any questions or concerns, I can see the patient at any time.

## 2023-11-09 NOTE — Progress Notes (Signed)
Patient arrives to floor from PACU, patient has itchy red rash to entire body. Notified MD and order placed for Benadryl. Will continue to monitor. Patient has no c/o SOB or throat itching.

## 2023-11-09 NOTE — ED Notes (Signed)
Critical value: trop 318 received by this RN from lab. Value communicated with on call hospitalist coverage via secure chat.

## 2023-11-09 NOTE — Plan of Care (Signed)
Spoke with patient this morning regarding femoral neck fracture as well as her daughter-in-law who has spoken with her son.  Plan for formal consultation with Dr. Dallas Schimke and surgical intervention this afternoon please remain NPO.

## 2023-11-09 NOTE — Progress Notes (Signed)
Patient given Benadryl 25 mg IV for rash/ hives. Rash is very itchy and patient scratching hands. Rash to entire body : Face, hands, arms, chest, abdomen, back, thighs. Rash appears to be hives. Patient had multiple medications/ antibiotics in OR and PACU. Will pass on to oncoming shift to continue to monitor.

## 2023-11-09 NOTE — TOC Initial Note (Signed)
Transition of Care [facility]) - Initial/Assessment Note    Patient Details  Name: Mary Mosley MRN: [id number] Date of Birth: August 12, 1941  Transition of [facility]) CM/SW Contact:    Marquita Palms, LCSW Phone Number: 11/09/2023, 11:34 AM  Clinical Narrative:                  CSW met with patient and her son at bedside. Patient reports she has PCP and pharmacy needs met. Patient is agreeable to SNF after her hip replacement and her son is agreeable as well. Patient reports having double vision at this time and would like to go to [facility] for SNF when she is done with her surgery for rehabilitation. Patient sons reports he lives 5 mins from patient. No other needs at this time. Expected Discharge Plan: Skilled Nursing Facility Barriers to Discharge: Continued Medical Work up (Patient has surgery scheduled for hip replacement)   Patient Goals and CMS Choice Patient states their goals for this hospitalization and ongoing recovery are:: to be able to go home and live independently again.          Expected Discharge Plan and Services In-house Referral: Clinical Social Work   Post Acute Care Choice: NA Living arrangements for the past 2 months: Single Family Home                                      Prior Living Arrangements/Services Living arrangements for the past 2 months: Single Family Home Lives with:: Self Patient language and need for interpreter reviewed:: No Do you feel safe going back to the place where you live?: No   Surgery upcoming  Need for Family Participation in Patient Care: Yes (Comment) Care giver support system in place?: Yes (comment)   Criminal Activity/Legal Involvement Pertinent to Current Situation/Hospitalization: No - Comment as needed  Activities of Daily Living      Permission Sought/Granted                  Emotional Assessment Appearance:: Disheveled Attitude/Demeanor/Rapport: Gracious Affect (typically observed):  Hopeful Orientation: : Oriented to Self, Oriented to Place, Oriented to  Time, Oriented to Situation      Admission diagnosis:  Fall at home, initial encounter [W19.Lorne Skeens, Y92.009] Patient Active Problem List   Diagnosis Date Noted   Fall at home, initial encounter 11/08/2023   Dizziness 11/08/2023   Femur fracture, right (HCC) 11/08/2023   Thoracic facet joint syndrome 08/10/2022   Spinal stenosis, lumbar region, with neurogenic claudication 04/05/2022   Lumbar degenerative disc disease 04/05/2022   Lumbar spondylosis 04/05/2022   Chronic bilateral thoracic back pain 04/05/2022   Chronic pain syndrome 04/05/2022   DDD (degenerative disc disease), cervical 02/07/2022   Thoracic degenerative disc disease 02/07/2022   Scoliosis 02/07/2022   Primary hypertension 02/09/2021   Breast calcification, right 09/03/2015   Myalgia 09/03/2015   Osteopenia 08/01/2011   Edema 03/12/2010   Insomnia 01/01/2009   OSA (obstructive sleep apnea) 07/24/2008   PCP:  Malva Limes, MD Pharmacy:   [facility] [address] [place] - [address] [place] [address] Phone: [phone] Fax: [phone]     Social Drivers of Health (SDOH) Social History: SDOH Screenings   Food Insecurity: No Food Insecurity (06/16/2023)  Housing: Low Risk  (06/16/2023)  Transportation Needs: No Transportation Needs (06/16/2023)  Utilities: Not At Risk (12/31/2022)  Alcohol Screen:  Low Risk  (09/13/2022)  Depression (PHQ2-9): Low Risk  (02/06/2023)  Financial Resource Strain: Low Risk  (06/16/2023)  Physical Activity: Sufficiently Active (06/16/2023)  Social Connections: Moderately Integrated (06/16/2023)  Stress: No Stress Concern Present (06/16/2023)  Tobacco Use: Low Risk  (11/08/2023)   SDOH Interventions:     Readmission Risk Interventions    11/09/2023   11:31 AM  Readmission Risk Prevention Plan  Post Dischage Appt Complete  Medication Screening Complete   Transportation Screening Complete

## 2023-11-09 NOTE — Hospital Course (Signed)
Right hip fracture.  Ortho hip replacement tomorrow afternoon.

## 2023-11-10 ENCOUNTER — Encounter: Payer: Self-pay | Admitting: Orthopedic Surgery

## 2023-11-10 DIAGNOSIS — I10 Essential (primary) hypertension: Secondary | ICD-10-CM | POA: Diagnosis not present

## 2023-11-10 DIAGNOSIS — W19XXXA Unspecified fall, initial encounter: Secondary | ICD-10-CM | POA: Diagnosis not present

## 2023-11-10 DIAGNOSIS — Y92009 Unspecified place in unspecified non-institutional (private) residence as the place of occurrence of the external cause: Secondary | ICD-10-CM | POA: Diagnosis not present

## 2023-11-10 LAB — CBC WITH DIFFERENTIAL/PLATELET
Abs Immature Granulocytes: 0.03 10*3/uL (ref 0.00–0.07)
Basophils Absolute: 0 10*3/uL (ref 0.0–0.1)
Basophils Relative: 0 %
Eosinophils Absolute: 0 10*3/uL (ref 0.0–0.5)
Eosinophils Relative: 0 %
HCT: 32.8 % — ABNORMAL LOW (ref 36.0–46.0)
Hemoglobin: 11.3 g/dL — ABNORMAL LOW (ref 12.0–15.0)
Immature Granulocytes: 0 %
Lymphocytes Relative: 7 %
Lymphs Abs: 0.6 10*3/uL — ABNORMAL LOW (ref 0.7–4.0)
MCH: 31.8 pg (ref 26.0–34.0)
MCHC: 34.5 g/dL (ref 30.0–36.0)
MCV: 92.4 fL (ref 80.0–100.0)
Monocytes Absolute: 0.8 10*3/uL (ref 0.1–1.0)
Monocytes Relative: 9 %
Neutro Abs: 7.8 10*3/uL — ABNORMAL HIGH (ref 1.7–7.7)
Neutrophils Relative %: 84 %
Platelets: 146 10*3/uL — ABNORMAL LOW (ref 150–400)
RBC: 3.55 MIL/uL — ABNORMAL LOW (ref 3.87–5.11)
RDW: 13.3 % (ref 11.5–15.5)
WBC: 9.3 10*3/uL (ref 4.0–10.5)
nRBC: 0 % (ref 0.0–0.2)

## 2023-11-10 LAB — BASIC METABOLIC PANEL
Anion gap: 9 (ref 5–15)
BUN: 18 mg/dL (ref 8–23)
CO2: 23 mmol/L (ref 22–32)
Calcium: 8.8 mg/dL — ABNORMAL LOW (ref 8.9–10.3)
Chloride: 104 mmol/L (ref 98–111)
Creatinine, Ser: 0.84 mg/dL (ref 0.44–1.00)
GFR, Estimated: >60 mL/min (ref >60)
Glucose, Bld: 94 mg/dL (ref 70–99)
Potassium: 4.2 mmol/L (ref 3.5–5.1)
Sodium: 136 mmol/L (ref 135–145)

## 2023-11-10 MED ORDER — ENOXAPARIN SODIUM 40 MG/0.4ML IJ SOSY
40.0000 mg | PREFILLED_SYRINGE | INTRAMUSCULAR | Status: DC
  Administered 2023-11-10: 40 mg via SUBCUTANEOUS
  Filled 2023-11-10: qty 0.4

## 2023-11-10 MED ORDER — SENNOSIDES-DOCUSATE SODIUM 8.6-50 MG PO TABS
1.0000 | ORAL_TABLET | Freq: Two times a day (BID) | ORAL | Status: DC
  Administered 2023-11-10 – 2023-11-11 (×2): 1 via ORAL
  Filled 2023-11-10 (×3): qty 1

## 2023-11-10 MED ORDER — ENSURE ENLIVE PO LIQD
237.0000 mL | Freq: Two times a day (BID) | ORAL | Status: DC
  Administered 2023-11-10 – 2023-11-11 (×2): 237 mL via ORAL

## 2023-11-10 MED ORDER — ADULT MULTIVITAMIN W/MINERALS CH
1.0000 | ORAL_TABLET | Freq: Every day | ORAL | Status: DC
  Administered 2023-11-10 – 2023-11-11 (×2): 1 via ORAL
  Filled 2023-11-10 (×2): qty 1

## 2023-11-10 NOTE — Progress Notes (Signed)
   ORTHOPAEDIC PROGRESS NOTE  s/p Procedure(s): Right hip hemiarthroplasty  DOS: 11/09/2023.  SUBJECTIVE: No issues overnight.  She states that she has no pain.  She feels much better.  She is able to get some rest.  No family at bedside.  All questions were answered.  OBJECTIVE: PE:  Elderly female.  No acute distress.  Alert and oriented.  Laying in bed.  Hip abduction pillow is in place. Aquacel dressing is intact.  No strikethrough. Active motion intact in the EHL/TA Sensation tact of the dorsum of the foot.   Vitals:   11/10/23 0500 11/10/23 0746  BP: (!) 121/59 130/66  Pulse: 83 90  Resp: 16 16  Temp: 98.4 F (36.9 C) 99.6 F (37.6 C)  SpO2: 97% 96%      Latest Ref Rng & Units 11/10/2023    8:23 AM 11/09/2023    4:14 PM 11/09/2023    5:40 AM  CBC  WBC 4.0 - 10.5 K/uL 9.3   8.4   Hemoglobin 12.0 - 15.0 g/dL 69.6  29.5  28.4   Hematocrit 36.0 - 46.0 % 32.8  38.0  40.6   Platelets 150 - 400 K/uL 146   154      ASSESSMENT: Mary Mosley is a 82 y.o. female doing well postoperatively.  PLAN: Weightbearing: WBAT RLE; hip precautions Incisional and dressing care: Reinforce dressings as needed Orthopedic device(s):  Hip abduction pillow.  To be worn at all times while in bed. VTE prophylaxis:  Recommend 81 mg of aspirin twice daily for 6 weeks.  No orthopedic contraindications otherwise.   Pain control: As needed.  P.o. medications recommended.  Judicious use of narcotics Follow - up plan: Patient can be seen in follow-up in [place] on January 9.  Otherwise, I can see the patient approximately 2 weeks after surgery at my clinic in [place].  PT/OT evaluation   Contact information:     Azha Constantin A. Dallas Schimke, MD MS [facility] [address] [place],  [place]  [address] Phone: [phone] Fax: [phone]

## 2023-11-10 NOTE — Progress Notes (Signed)
Initial Nutrition Assessment  DOCUMENTATION CODES:   Underweight, Non-severe (moderate) malnutrition in context of social or environmental circumstances  INTERVENTION:   -Continue regular diet -MVI with minerals daily -Ensure Enlive po BID, each supplement provides 350 kcal and 20 grams of protein.   NUTRITION DIAGNOSIS:   Moderate Malnutrition related to social / environmental circumstances as evidenced by mild fat depletion, moderate fat depletion, moderate muscle depletion, mild muscle depletion.  GOAL:   Patient will meet greater than or equal to 90% of their needs  MONITOR:   PO intake, Supplement acceptance  REASON FOR ASSESSMENT:   Consult Assessment of nutrition requirement/status  ASSESSMENT:   Pt with medical history significant for essential hypertension, allergies to multiple medications including amlodipine, diltiazem, Fosamax, gabapentin, HCTZ, penicillin admitted for fall at home in her garden on her right side. Patient did strike her head on the right side, but no loss of consciousness and had a laceration.  Pt admitted with rt femur fracture.   12/19- s/p Right hip hemiarthroplasty   Reviewed I/O's: +350 ml x 24 hours  UOP: 950 ml x 24 hours   Spoke with pt, who was sitting in recliner chair at time of visit. Pt reports she feels good and just recently worked with acute rehab team, which went well. {Pt has not eaten anything today yet, but consumed a bowl of soup for dinner without difficulty.   PTA, pt reports fair appetite, consuming 3 meals per day (Breakfast: cereal; Lunch: Healthy Choice frozen meal; Dinner: mea,t starch, and vegetable). Pt drinks mostly water and coffee at home.   Pt denies any weight loss. She reports her UBW is around 150#. Reviewed wt hx; pt has experienced a 4.2% wt loss over the past 3 months, which is not significant for time frame.   Discussed importance of good meal and supplement intake to promote healing. Pt amenable to  supplements. She does not take them, but is willing to continue at home and that her son will purchase for her. She reports she is familiar with supplements as her husband who had RA drank them.   Medications reviewed and include MVI, senokot, and lovenox.   Labs reviewed.    NUTRITION - FOCUSED PHYSICAL EXAM:  Flowsheet Row Most Recent Value  Orbital Region Mild depletion  Upper Arm Region Moderate depletion  Thoracic and Lumbar Region No depletion  Buccal Region Mild depletion  Temple Region Mild depletion  Clavicle Bone Region Moderate depletion  Clavicle and Acromion Bone Region Moderate depletion  Scapular Bone Region Moderate depletion  Dorsal Hand Moderate depletion  Patellar Region Mild depletion  Anterior Thigh Region Mild depletion  Posterior Calf Region Mild depletion  Edema (RD Assessment) Mild  Hair Reviewed  Eyes Reviewed  Mouth Reviewed  Skin Reviewed  Nails Reviewed       Diet Order:   Diet Order             Diet regular Room service appropriate? Yes; Fluid consistency: Thin  Diet effective now                   EDUCATION NEEDS:   Education needs have been addressed  Skin:  Skin Assessment: Skin Integrity Issues: Skin Integrity Issues:: Incisions Incisions: closed rt hip  Last BM:  11/08/23  Height:   Ht Readings from Last 1 Encounters:  11/08/23 5\' 7"  (1.702 m)    Weight:   Wt Readings from Last 1 Encounters:  11/08/23 52.6 kg    Ideal Body  Weight:  61.4 kg  BMI:  Body mass index is 18.17 kg/m.  Estimated Nutritional Needs:   Kcal:  1650-1850  Protein:  85-100 grams  Fluid:  > 1.6 L    Levada Schilling, RD, LDN, CDCES Registered Dietitian III Certified Diabetes Care and Education Specialist If unable to reach this RD, please use "RD Inpatient" group chat on secure chat between hours of 8am-4 pm daily

## 2023-11-10 NOTE — Plan of Care (Signed)
  Problem: Education: Goal: Knowledge of General Education information will improve Description: Including pain rating scale, medication(s)/side effects and non-pharmacologic comfort measures Outcome: Progressing   Problem: Health Behavior/Discharge Planning: Goal: Ability to manage health-related needs will improve Outcome: Progressing   Problem: Clinical Measurements: Goal: Ability to maintain clinical measurements within normal limits will improve Outcome: Progressing Goal: Will remain free from infection Outcome: Progressing Goal: Diagnostic test results will improve Outcome: Progressing   Problem: Nutrition: Goal: Adequate nutrition will be maintained Outcome: Progressing   Problem: Coping: Goal: Level of anxiety will decrease Outcome: Progressing   Problem: Elimination: Goal: Will not experience complications related to bowel motility Outcome: Progressing   Problem: Pain Management: Goal: General experience of comfort will improve Outcome: Progressing   Problem: Safety: Goal: Ability to remain free from injury will improve Outcome: Progressing

## 2023-11-10 NOTE — Evaluation (Signed)
Occupational Therapy Evaluation Patient Details Name: Mary Mosley MRN: [id number] DOB: 1940-12-10 Today's Date: 11/10/2023   History of Present Illness Pt is an 82 y.o. female presenting to hospital 11/08/23 s/p fall.  Imaging showing "Angulated, impacted transcervical fracture of the right femoral neck".  Pt admitted with fall, R femur fx, and dizziness.  Pt s/p R hip hemiarthroplasty 11/09/23.  PMH includes htn, OSA, osteopenia.   Clinical Impression   Pt seen for OT evaluation this date, POD#1 from above surgery. Pt was independent in all ADLs prior to surgery, fell while working in her garden. Pt is eager to return to PLOF with less pain and improved safety and independence. Pt currently requires CGA-MIN A for bed mobility for RLE mgt, CGA for standing with VC for hands/feet placement to maintain precautions, and MIN-MOD A for LB ADL given posterior hip precautions. Pt (and son once he arrived) instructed in posterior total hip precautions and how to implement, self care skills, falls prevention strategies, home/routines modifications, and DME/AE for LB bathing and dressing tasks. At end of session, pt able to recall 2/3 posterior total hip precautions and required VC for third. Pt would benefit from additional instruction in self care skills and techniques to help maintain precautions with or without assistive devices to support recall and carryover prior to discharge.        If plan is discharge home, recommend the following: A little help with walking and/or transfers;A little help with bathing/dressing/bathroom;Assistance with cooking/housework;Assist for transportation;Help with stairs or ramp for entrance    Functional Status Assessment  Patient has had a recent decline in their functional status and demonstrates the ability to make significant improvements in function in a reasonable and predictable amount of time.  Equipment Recommendations  BSC/3in1;Other (comment);Tub/shower seat  (2WW)    Recommendations for Other Services       Precautions / Restrictions Precautions Precautions: Posterior Hip;Fall Precaution Booklet Issued: Yes (comment) Restrictions Weight Bearing Restrictions Per Provider Order: Yes RLE Weight Bearing Per Provider Order: Weight bearing as tolerated      Mobility Bed Mobility Overal bed mobility: Needs Assistance Bed Mobility: Supine to Sit     Supine to sit: Contact guard, Min assist, HOB elevated, Used rails     General bed mobility comments: VC for sequencing to maintain precautions, CGA-MIN A for RLE mgt, increased time/effort and use of rails    Transfers Overall transfer level: Needs assistance Equipment used: Rolling walker (2 wheels) Transfers: Sit to/from Stand Sit to Stand: Contact guard assist, From elevated surface           General transfer comment: VC for feet/hands placement and to correct for initial posterior LOB      Balance Overall balance assessment: Needs assistance Sitting-balance support: No upper extremity supported, Feet supported Sitting balance-Leahy Scale: Good     Standing balance support: Bilateral upper extremity supported, During functional activity, Reliant on assistive device for balance Standing balance-Leahy Scale: Fair Standing balance comment: fair dynamic balance after initially standing with slight posterior lean requiring VC and CGA to correct                           ADL either performed or assessed with clinical judgement   ADL Overall ADL's : Needs assistance/impaired                     Lower Body Dressing: Sit to/from stand;Minimal assistance;Moderate assistance   Toilet Transfer:  Contact guard assist;Rolling walker (2 wheels)           Functional mobility during ADLs: Contact guard assist;Cueing for sequencing;Rolling walker (2 wheels)       Vision         Perception         Praxis         Pertinent Vitals/Pain Pain  Assessment Pain Assessment: 0-10 Pain Score: 6  Pain Location: R hip Pain Descriptors / Indicators: Sore, Discomfort Pain Intervention(s): Limited activity within patient's tolerance, Monitored during session, Repositioned, Patient requesting pain meds-RN notified     Extremity/Trunk Assessment Upper Extremity Assessment Upper Extremity Assessment: Overall WFL for tasks assessed   Lower Extremity Assessment Lower Extremity Assessment: Generalized weakness;RLE deficits/detail RLE Deficits / Details: at least 2+5 hip flexion (limited d/t pain); at least 3/5 AROM knee flexio/extension and DF RLE: Unable to fully assess due to pain   Cervical / Trunk Assessment Cervical / Trunk Assessment: Normal   Communication Communication Communication: No apparent difficulties Cueing Techniques: Verbal cues   Cognition Arousal: Alert Behavior During Therapy: WFL for tasks assessed/performed Overall Cognitive Status: Within Functional Limits for tasks assessed                                 General Comments: a bit apprehensive     General Comments  R hip bandage in place    Exercises Other Exercises Other Exercises: Pt/son educated in posterior THPs and how to maintain during ADL and mobility, RW use, AE/DME for LB ADL, falls prevention, and home/routines modifications. Handout provided.   Shoulder Instructions      Home Living Family/patient expects to be discharged to:: Private residence Living Arrangements: Alone Available Help at Discharge: Family;Available PRN/intermittently (Pt's son lives 5 minutes away) Type of Home: House Home Access: Stairs to enter [employer] of Steps: 2 Entrance Stairs-Rails: None Home Layout: One level     Bathroom Shower/Tub: Producer, television/film/video: Handicapped height     Home Equipment: Hand held shower head;Adaptive equipment;Grab bars - tub/shower Adaptive Equipment: Reacher Additional Comments: Pt could  stay at her son's home initially (able to stay on main level; 1 STE no railing; tub/shower; no DME).  Son works from home so could provide 24/7 supervision.      Prior Functioning/Environment Prior Level of Function : Independent/Modified Independent;Driving;History of Falls (last six months)             Mobility Comments: Independent; no other falls reported ADLs Comments: Pt driving short familiar distances, avoids highways; indep with ADL and IADL        OT Problem List: Decreased strength;Decreased range of motion;Pain;Impaired balance (sitting and/or standing);Decreased knowledge of use of DME or AE;Decreased knowledge of precautions      OT Treatment/Interventions: Self-care/ADL training;Therapeutic exercise;Therapeutic activities;DME and/or AE instruction;Patient/family education;Balance training    OT Goals(Current goals can be found in the care plan section) Acute Rehab OT Goals Patient Stated Goal: get better and be independent OT Goal Formulation: With patient/family Time For Goal Achievement: 11/24/23 Potential to Achieve Goals: Good ADL Goals Pt Will Perform Grooming: with modified independence;standing (standing at sink, maintaining precautions) Pt Will Perform Lower Body Dressing: with adaptive equipment;sit to/from stand;with supervision (maintaining precautions) Pt Will Transfer to Toilet: with supervision;ambulating (LRAD, maintaining precautions, elevated toilet) Additional ADL Goal #1: Pt will verbalize 100% of hip precautions and how to maintain during LB ADL, ADL transfers,  and while negotiating her bathroom environment.  OT Frequency: Min 1X/week    Co-evaluation              AM-PAC OT "6 Clicks" Daily Activity     Outcome Measure Help from another person eating meals?: None Help from another person taking care of personal grooming?: A Little Help from another person toileting, which includes using toliet, bedpan, or urinal?: A Little Help from  another person bathing (including washing, rinsing, drying)?: A Lot Help from another person to put on and taking off regular upper body clothing?: A Little Help from another person to put on and taking off regular lower body clothing?: A Lot 6 Click Score: 17   End of Session Equipment Utilized During Treatment: Rolling walker (2 wheels) Nurse Communication: Patient requests pain meds  Activity Tolerance: Patient tolerated treatment well Patient left: in chair;with call bell/phone within reach;with chair alarm set;with family/visitor present  OT Visit Diagnosis: Other abnormalities of gait and mobility (R26.89);Muscle weakness (generalized) (M62.81);History of falling (Z91.81);Pain Pain - Right/Left: Right Pain - part of body: Hip                Time: 0826-0912 OT Time Calculation (min): 46 min Charges:  OT General Charges $OT Visit: 1 Visit OT Evaluation $OT Eval Moderate Complexity: 1 Mod OT Treatments $Self Care/Home Management : 23-37 mins  [name]., MPH, MS, OTR/L ascom [phone] 11/10/23, 12:46 PM

## 2023-11-10 NOTE — Evaluation (Signed)
Physical Therapy Evaluation Patient Details Name: Mary Mosley MRN: [id number] DOB: 08/20/1941 Today's Date: 11/10/2023  History of Present Illness  Pt is an 82 y.o. female presenting to hospital 11/08/23 s/p fall.  Imaging showing "Angulated, impacted transcervical fracture of the right femoral neck".  Pt admitted with fall, R femur fx, and dizziness.  Pt s/p R hip hemiarthroplasty 11/09/23.  PMH includes htn, OSA, osteopenia.  Clinical Impression  Prior to recent medical concerns, pt was independent with ambulation; lives alone in 1 level home with 2 STE (no railing); able to stay with her son who lives close by (stay on main level with 1 STE no railing; son works from home).  R hip pain 0/10 at rest beginning of session; pain increased with activity; and pain 3/10 at rest end of session.  Currently pt is CGA with transfer using RW and CGA to min assist to ambulate 20 feet with RW use (pt with 1 loss of balance when turning requiring min assist to regain balance).  Pt requiring cueing for posterior THP's during sessions activities.  Pt would currently benefit from skilled PT to address noted impairments and functional limitations (see below for any additional details).  Upon hospital discharge, pt would benefit from ongoing therapy.     If plan is discharge home, recommend the following: A little help with walking and/or transfers;A little help with bathing/dressing/bathroom;Assistance with cooking/housework;Assist for transportation;Help with stairs or ramp for entrance   Can travel by private vehicle        Equipment Recommendations Rolling walker (2 wheels);BSC/3in1  Recommendations for Other Services       Functional Status Assessment Patient has had a recent decline in their functional status and demonstrates the ability to make significant improvements in function in a reasonable and predictable amount of time.     Precautions / Restrictions Precautions Precautions: Posterior  Hip;Fall Precaution Booklet Issued: Yes (comment) Restrictions Weight Bearing Restrictions Per Provider Order: Yes RLE Weight Bearing Per Provider Order: Weight bearing as tolerated      Mobility  Bed Mobility               General bed mobility comments: Deferred (pt in recliner beginning/end of session)    Transfers Overall transfer level: Needs assistance Equipment used: Rolling walker (2 wheels) Transfers: Sit to/from Stand Sit to Stand: Contact guard assist           General transfer comment: vc's for UE/LE placement and posterior THP's; increased effort to stand on own    Ambulation/Gait Ambulation/Gait assistance: Contact guard assist, Min assist Gait Distance (Feet): 20 Feet Assistive device: Rolling walker (2 wheels)   Gait velocity: decreased     General Gait Details: antalgic; decreased stance time R LE; step to gait pattern; vc's for posterior THP's with turning; 1 loss of balance when turning (min assist to correct)  Stairs            Wheelchair Mobility     Tilt Bed    Modified Rankin (Stroke Patients Only)       Balance Overall balance assessment: Needs assistance Sitting-balance support: No upper extremity supported, Feet supported Sitting balance-Leahy Scale: Good Sitting balance - Comments: steady reaching within BOS   Standing balance support: Bilateral upper extremity supported, During functional activity, Reliant on assistive device for balance Standing balance-Leahy Scale: Fair Standing balance comment: 1 loss of balance posterior when turning while walking (min assist to correct)  Pertinent Vitals/Pain Pain Assessment Pain Assessment: 0-10 Pain Score: 3  Pain Location: R hip Pain Descriptors / Indicators: Sore, Discomfort Pain Intervention(s): Limited activity within patient's tolerance, Monitored during session, Repositioned Vitals (HR and SpO2 on room air) stable and WFL  throughout treatment session.    Home Living Family/patient expects to be discharged to:: Private residence Living Arrangements: Alone Available Help at Discharge: Family;Available PRN/intermittently (Pt's son lives 5 minutes away) Type of Home: House Home Access: Stairs to enter Entrance Stairs-Rails: None Entrance Stairs-Number of Steps: 2   Home Layout: One level Home Equipment: Hand held shower head;Adaptive equipment;Grab bars - tub/shower Additional Comments: Pt could stay at her son's home initially (able to stay on main level; 1 STE no railing; tub/shower; no DME).  Son works from home so could provide 24/7 supervision.    Prior Function Prior Level of Function : Independent/Modified Independent;Driving;History of Falls (last six months)             Mobility Comments: Independent; no other falls reported ADLs Comments: Pt driving short familiar distances, avoids highways; indep with ADL and IADL     Extremity/Trunk Assessment   Upper Extremity Assessment Upper Extremity Assessment: Overall WFL for tasks assessed    Lower Extremity Assessment Lower Extremity Assessment: Generalized weakness;RLE deficits/detail RLE Deficits / Details: at least 2+5 hip flexion (limited d/t pain); at least 3/5 AROM knee flexio/extension and DF RLE: Unable to fully assess due to pain    Cervical / Trunk Assessment Cervical / Trunk Assessment: Normal  Communication   Communication Communication: No apparent difficulties Cueing Techniques: Verbal cues;Visual cues  Cognition Arousal: Alert Behavior During Therapy: WFL for tasks assessed/performed Overall Cognitive Status: Within Functional Limits for tasks assessed                                          General Comments General comments (skin integrity, edema, etc.): R hip bandage in place.  Nursing cleared pt for participation in physical therapy.  Pt agreeable to PT session.    Exercises  Transfer and gait  training   Assessment/Plan    PT Assessment Patient needs continued PT services  PT Problem List Decreased strength;Decreased activity tolerance;Decreased balance;Decreased mobility;Decreased knowledge of use of DME;Decreased knowledge of precautions;Decreased skin integrity;Pain       PT Treatment Interventions DME instruction;Gait training;Stair training;Functional mobility training;Therapeutic activities;Therapeutic exercise;Balance training;Patient/family education    PT Goals (Current goals can be found in the Care Plan section)  Acute Rehab PT Goals Patient Stated Goal: to improve pain and walking PT Goal Formulation: With patient Time For Goal Achievement: 11/24/23 Potential to Achieve Goals: Good    Frequency BID     Co-evaluation               AM-PAC PT "6 Clicks" Mobility  Outcome Measure Help needed turning from your back to your side while in a flat bed without using bedrails?: None Help needed moving from lying on your back to sitting on the side of a flat bed without using bedrails?: A Little Help needed moving to and from a bed to a chair (including a wheelchair)?: A Little Help needed standing up from a chair using your arms (e.g., wheelchair or bedside chair)?: A Little Help needed to walk in hospital room?: A Little Help needed climbing 3-5 steps with a railing? : A Lot 6 Click Score: 18  End of Session Equipment Utilized During Treatment: Gait belt Activity Tolerance: Patient tolerated treatment well Patient left: in chair;with call bell/phone within reach;with chair alarm set;Other (comment) (pillows between pt's knees for posterior THP's) Nurse Communication: Mobility status;Precautions;Weight bearing status (via white board and NT notified) PT Visit Diagnosis: Other abnormalities of gait and mobility (R26.89);Muscle weakness (generalized) (M62.81);History of falling (Z91.81);Pain Pain - Right/Left: Right Pain - part of body: Hip    Time:  4098-1191 PT Time Calculation (min) (ACUTE ONLY): 25 min   Charges:   PT Evaluation $PT Eval Low Complexity: 1 Low PT Treatments $Gait Training: 8-22 mins PT General Charges $$ ACUTE PT VISIT: 1 Visit        Hendricks Limes, PT 11/10/23, 11:09 AM

## 2023-11-10 NOTE — Progress Notes (Signed)
Physical Therapy Treatment Patient Details Name: Mary Mosley MRN: [id number] DOB: 04-17-1941 Today's Date: 11/10/2023   History of Present Illness Pt is an 82 y.o. female presenting to hospital 11/08/23 s/p fall.  Imaging showing "Angulated, impacted transcervical fracture of the right femoral neck".  Pt admitted with fall, R femur fx, and dizziness.  Pt s/p R hip hemiarthroplasty 11/09/23.  PMH includes htn, OSA, osteopenia.    PT Comments  Pt resting in recliner upon PT arrival; pt agreeable to therapy; pt's son present.  2-3/10 R hip pain/discomfort during session.  Currently pt is CGA with transfers (x6 trials from recliner focusing on technique and posterior THP's) and CGA to SBA ambulating 120 feet with RW use.  Pt requiring intermittent cueing for posterior THP's with sessions activities.  Pt's son reports he needs to check with his wife to see if pt is able to stay with them at discharge.  Will continue to focus on strengthening, balance, and progressive functional mobility during hospitalization.    If plan is discharge home, recommend the following: A little help with walking and/or transfers;A little help with bathing/dressing/bathroom;Assistance with cooking/housework;Assist for transportation;Help with stairs or ramp for entrance   Can travel by private vehicle        Equipment Recommendations  Rolling walker (2 wheels);BSC/3in1    Recommendations for Other Services       Precautions / Restrictions Precautions Precautions: Posterior Hip;Fall Precaution Booklet Issued: Yes (comment) Restrictions Weight Bearing Restrictions Per Provider Order: Yes RLE Weight Bearing Per Provider Order: Weight bearing as tolerated     Mobility  Bed Mobility               General bed mobility comments: Deferred (pt in recliner beginning/end of session)    Transfers Overall transfer level: Needs assistance Equipment used: Rolling walker (2 wheels) Transfers: Sit to/from  Stand Sit to Stand: Contact guard assist           General transfer comment: vc's for UE/LE positioning and for posterior THP's; x6 trials (sit to/from stand) from recliner (pt's son present and able to give appropriate cueing)    Ambulation/Gait Ambulation/Gait assistance: Contact guard assist, Supervision Gait Distance (Feet): 120 Feet Assistive device: Rolling walker (2 wheels)   Gait velocity: decreased     General Gait Details: antalgic; decreased stance time R LE; partial step through gait pattern; occasional vc's for posterior THP's with turning   Stairs             Wheelchair Mobility     Tilt Bed    Modified Rankin (Stroke Patients Only)       Balance Overall balance assessment: Needs assistance Sitting-balance support: No upper extremity supported, Feet supported Sitting balance-Leahy Scale: Good Sitting balance - Comments: steady reaching within BOS   Standing balance support: Bilateral upper extremity supported, During functional activity, Reliant on assistive device for balance Standing balance-Leahy Scale: Fair Standing balance comment: steady ambulating with RW use                            Cognition Arousal: Alert Behavior During Therapy: WFL for tasks assessed/performed Overall Cognitive Status: Within Functional Limits for tasks assessed                                          Exercises  General Comments General comments (skin integrity, edema, etc.): R hip bandage in place      Pertinent Vitals/Pain Pain Assessment Pain Assessment: 0-10 Pain Score: 3  Pain Location: R hip Pain Descriptors / Indicators: Sore, Discomfort Pain Intervention(s): Limited activity within patient's tolerance, Monitored during session Vitals (HR and SpO2 on room air) stable and WFL throughout treatment session.    Home Living Family/patient expects to be discharged to:: Private residence Living Arrangements:  Alone Available Help at Discharge: Family;Available PRN/intermittently (Pt's son lives 5 minutes away) Type of Home: House Home Access: Stairs to enter Entrance Stairs-Rails: None Entrance Stairs-Number of Steps: 2   Home Layout: One level Home Equipment: Hand held shower head;Adaptive equipment;Grab bars - tub/shower Additional Comments: Pt could stay at her son's home initially (able to stay on main level; 1 STE no railing; tub/shower; no DME).  Son works from home so could provide 24/7 supervision.    Prior Function            PT Goals (current goals can now be found in the care plan section) Acute Rehab PT Goals Patient Stated Goal: to improve pain and walking PT Goal Formulation: With patient Time For Goal Achievement: 11/24/23 Potential to Achieve Goals: Good Progress towards PT goals: Progressing toward goals    Frequency    BID      PT Plan      Co-evaluation              AM-PAC PT "6 Clicks" Mobility   Outcome Measure  Help needed turning from your back to your side while in a flat bed without using bedrails?: None Help needed moving from lying on your back to sitting on the side of a flat bed without using bedrails?: A Little Help needed moving to and from a bed to a chair (including a wheelchair)?: A Little Help needed standing up from a chair using your arms (e.g., wheelchair or bedside chair)?: A Little Help needed to walk in hospital room?: A Little Help needed climbing 3-5 steps with a railing? : A Lot 6 Click Score: 18    End of Session Equipment Utilized During Treatment: Gait belt Activity Tolerance: Patient tolerated treatment well Patient left: in chair;with call bell/phone within reach;with chair alarm set;with family/visitor present;with SCD's reapplied;Other (comment) (pillows between pt's knees for posterior hip precautions; B heels floating via pillow support) Nurse Communication: Mobility status;Precautions;Weight bearing status (via  white board) PT Visit Diagnosis: Other abnormalities of gait and mobility (R26.89);Muscle weakness (generalized) (M62.81);History of falling (Z91.81);Pain Pain - Right/Left: Right Pain - part of body: Hip     Time: 1610-9604 PT Time Calculation (min) (ACUTE ONLY): 28 min  Charges:    $Gait Training: 8-22 mins $Therapeutic Activity: 8-22 mins PT General Charges $$ ACUTE PT VISIT: 1 Visit                     Hendricks Limes, PT 11/10/23, 4:34 PM

## 2023-11-10 NOTE — Progress Notes (Signed)
PROGRESS NOTE    Mary Mosley  NWG:[id number] DOB: November 21, 1941 DOA: 11/08/2023 PCP: Malva Limes, MD    Brief Narrative:   82 y.o. female with medical history significant for essential hypertension, allergies to multiple medications including amlodipine, diltiazem, Fosamax, gabapentin, HCTZ, penicillin coming today for fall at home in her garden on her right side, patient did strike her head on the right side, but no loss of consciousness and had a laceration on arrival to the ED.  Patient has intermittent dizziness history in the past as well.    Assessment & Plan:   Principal Problem:   Fall at home, initial encounter Active Problems:   Femur fracture, right (HCC)   Dizziness   Primary hypertension  Right-sided femur fracture Mechanical fall versus syncope Patient apparently fell at home when gardening.  She states she is she was bent over for an extended period of time and then rose up too quickly.  As a result she did experience dizziness.  Unclear whether she truly lost consciousness.  Suspect orthostasis and at least presyncopal event. Plan: OK for diet Pain control PT OT to start today  chemoprophylaxis to start today  Essential hypertension Patient does not appear to be on any home medications Blood pressure elevated but may be secondary to pain IV hydralazine as needed for now Ensure pain control   DVT prophylaxis: SQ lovenox Code Status: Full Family Communication: None Disposition Plan: Status is: Inpatient Remains inpatient appropriate because: Hip fracture   Level of care: Telemetry Medical  Consultants:  Orthopedics  Procedures:  Hip fracture repair  Antimicrobials: None   Subjective: Seen and examined.  Resting comfortably in bed.  Pain well-controlled.  Objective: Vitals:   11/09/23 2012 11/09/23 2340 11/10/23 0500 11/10/23 0746  BP: 121/60 124/66 (!) 121/59 130/66  Pulse: 96 82 83 90  Resp: 16 16 16 16   Temp: 97.8 F (36.6 C) 97.9  F (36.6 C) 98.4 F (36.9 C) 99.6 F (37.6 C)  TempSrc:      SpO2: 99% 100% 97% 96%  Weight:      Height:        Intake/Output Summary (Last 24 hours) at 11/10/2023 1219 Last data filed at 11/10/2023 0810 Gross per 24 hour  Intake 1600 ml  Output 1550 ml  Net 50 ml   Filed Weights   11/08/23 1725  Weight: 52.6 kg    Examination:  General exam: NAD Respiratory system: Clear to auscultation. Respiratory effort normal. Cardiovascular system: S1-S2, RRR, no murmurs, no pedal edema Gastrointestinal system: Thin, soft, NT/ND, normal bowel sounds Central nervous system: Alert and oriented. No focal neurological deficits. Extremities: Right hip surgical dressing CDI Skin: No rashes, lesions or ulcers Psychiatry: Judgement and insight appear normal. Mood & affect appropriate.     Data Reviewed: I have personally reviewed following labs and imaging studies  CBC: Recent Labs  Lab 11/08/23 2122 11/09/23 0540 11/09/23 1614 11/10/23 0823  WBC 10.8* 8.4  --  9.3  NEUTROABS 9.7*  --   --  7.8*  HGB 13.5 13.6 12.6 11.3*  HCT 40.4 40.6 38.0 32.8*  MCV 94.2 91.4  --  92.4  PLT 168 154  --  146*   Basic Metabolic Panel: Recent Labs  Lab 11/08/23 2122 11/09/23 0540 11/10/23 0823  NA 135 135 136  K 4.0 3.7 4.2  CL 101 101 104  CO2 24 25 23   GLUCOSE 125* 132* 94  BUN 18 15 18   CREATININE 0.69 0.71 0.84  CALCIUM 10.1 9.7 8.8*   GFR: Estimated Creatinine Clearance: 41.4 mL/min (by C-G formula based on SCr of 0.84 mg/dL). Liver Function Tests: Recent Labs  Lab 11/09/23 0540  AST 29  ALT 21  ALKPHOS 56  BILITOT 1.0  PROT 7.3  ALBUMIN 4.7   No results for input(s): "LIPASE", "AMYLASE" in the last 168 hours. No results for input(s): "AMMONIA" in the last 168 hours. Coagulation Profile: No results for input(s): "INR", "PROTIME" in the last 168 hours. Cardiac Enzymes: No results for input(s): "CKTOTAL", "CKMB", "CKMBINDEX", "TROPONINI" in the last 168 hours. BNP  (last 3 results) No results for input(s): "PROBNP" in the last 8760 hours. HbA1C: No results for input(s): "HGBA1C" in the last 72 hours. CBG: No results for input(s): "GLUCAP" in the last 168 hours. Lipid Profile: No results for input(s): "CHOL", "HDL", "LDLCALC", "TRIG", "CHOLHDL", "LDLDIRECT" in the last 72 hours. Thyroid Function Tests: Recent Labs    11/09/23 0540  TSH 0.515  FREET4 0.94   Anemia Panel: Recent Labs    11/09/23 0732  VITAMINB12 617   Sepsis Labs: No results for input(s): "PROCALCITON", "LATICACIDVEN" in the last 168 hours.  No results found for this or any previous visit (from the past 240 hours).       Radiology Studies: DG HIP UNILAT WITH PELVIS 2-3 VIEWS RIGHT Result Date: 11/09/2023 CLINICAL DATA:  Right hip replacement. EXAM: DG HIP (WITH OR WITHOUT PELVIS) 2-3V RIGHT COMPARISON:  CT right hip and right hip x-rays from yesterday. FINDINGS: The right hip demonstrates a hemiarthroplasty without evidence of hardware failure or complication. There is no fracture or dislocation. The alignment is anatomic. Post-surgical changes noted in the surrounding soft tissues. IMPRESSION: 1. Right hip hemiarthroplasty without immediate postoperative complication. Electronically Signed   By: Obie Dredge M.D.   On: 11/09/2023 19:41   CT Hip Right Wo Contrast Result Date: 11/08/2023 CLINICAL DATA:  Fracture evaluation.  Pain after fall EXAM: CT OF THE RIGHT HIP WITHOUT CONTRAST TECHNIQUE: Multidetector CT imaging of the right hip was performed according to the standard protocol. Multiplanar CT image reconstructions were also generated. RADIATION DOSE REDUCTION: This exam was performed according to the departmental dose-optimization program which includes automated exposure control, adjustment of the mA and/or kV according to patient size and/or use of iterative reconstruction technique. COMPARISON:  Radiographs earlier today FINDINGS: Bones/Joint/Cartilage Subcapital  fracture of the right femoral neck. The femoral shaft is displaced superiorly and laterally. The femoral head remains seated in the acetabulum. Ligaments Suboptimally assessed by CT. Muscles and Tendons No acute abnormality. Soft tissues Unremarkable. IMPRESSION: Subcapital fracture of the right femoral neck. Electronically Signed   By: Minerva Fester M.D.   On: 11/08/2023 22:42   DG Chest Portable 1 View Result Date: 11/08/2023 CLINICAL DATA:  Fall, preop EXAM: PORTABLE CHEST 1 VIEW COMPARISON:  None available. FINDINGS: Lungs are essentially clear. Mild linear scarring/atelectasis in the left lower lobe. No pleural effusion or pneumothorax. The heart is normal in size.  Thoracic aortic atherosclerosis. IMPRESSION: No acute cardiopulmonary disease. Electronically Signed   By: Charline Bills M.D.   On: 11/08/2023 22:15   DG Hip Unilat With Pelvis 2-3 Views Right Result Date: 11/08/2023 CLINICAL DATA:  Fall, pain EXAM: DG HIP (WITH OR WITHOUT PELVIS) 2-3V RIGHT COMPARISON:  None Available. FINDINGS: Angulated, impacted transcervical fracture of the right femoral neck. No other displaced fractures of the pelvis or proximal left femur seen in single frontal view. Nonobstructive pattern of overlying bowel gas. IMPRESSION: Angulated, impacted  transcervical fracture of the right femoral neck. Electronically Signed   By: Jearld Lesch M.D.   On: 11/08/2023 19:18   CT Head Wo Contrast Result Date: 11/08/2023 CLINICAL DATA:  Head trauma, moderate-severe; Neck trauma (Age >= 65y) EXAM: CT HEAD WITHOUT CONTRAST CT CERVICAL SPINE WITHOUT CONTRAST TECHNIQUE: Multidetector CT imaging of the head and cervical spine was performed following the standard protocol without intravenous contrast. Multiplanar CT image reconstructions of the cervical spine were also generated. RADIATION DOSE REDUCTION: This exam was performed according to the departmental dose-optimization program which includes automated exposure control,  adjustment of the mA and/or kV according to patient size and/or use of iterative reconstruction technique. COMPARISON:  CT head and CT cervical spine Apr 13, 2022. FINDINGS: CT HEAD FINDINGS Brain: No evidence of acute infarction, hemorrhage, hydrocephalus, extra-axial collection or mass lesion/mass effect. Vascular: No hyperdense vessel or unexpected calcification. Skull: No acute fracture.  Right scalp contusion. Sinuses/Orbits: Clear sinuses.  No acute orbital findings. CT CERVICAL SPINE FINDINGS Alignment: No substantial sagittal subluxation. Skull base and vertebrae: No acute fracture. No primary bone lesion or focal pathologic process. Soft tissues and spinal canal: No prevertebral fluid or swelling. No visible canal hematoma. Disc levels:  Mild for age multilevel bony degenerative change. Upper chest: Visualized lung apices are clear. Other: Heterogeneous enlarged thyroid. IMPRESSION: 1. No evidence of acute abnormality intracranially or in the cervical spine. 2. Right scalp contusion without fracture. 3. Heterogeneous enlarged thyroid. Recommend thyroid US (ref: J Am Coll Radiol. 2015 Feb;12(2): 143-50). Electronically Signed   By: Feliberto Harts M.D.   On: 11/08/2023 18:21   CT Cervical Spine Wo Contrast Result Date: 11/08/2023 CLINICAL DATA:  Head trauma, moderate-severe; Neck trauma (Age >= 65y) EXAM: CT HEAD WITHOUT CONTRAST CT CERVICAL SPINE WITHOUT CONTRAST TECHNIQUE: Multidetector CT imaging of the head and cervical spine was performed following the standard protocol without intravenous contrast. Multiplanar CT image reconstructions of the cervical spine were also generated. RADIATION DOSE REDUCTION: This exam was performed according to the departmental dose-optimization program which includes automated exposure control, adjustment of the mA and/or kV according to patient size and/or use of iterative reconstruction technique. COMPARISON:  CT head and CT cervical spine Apr 13, 2022. FINDINGS: CT  HEAD FINDINGS Brain: No evidence of acute infarction, hemorrhage, hydrocephalus, extra-axial collection or mass lesion/mass effect. Vascular: No hyperdense vessel or unexpected calcification. Skull: No acute fracture.  Right scalp contusion. Sinuses/Orbits: Clear sinuses.  No acute orbital findings. CT CERVICAL SPINE FINDINGS Alignment: No substantial sagittal subluxation. Skull base and vertebrae: No acute fracture. No primary bone lesion or focal pathologic process. Soft tissues and spinal canal: No prevertebral fluid or swelling. No visible canal hematoma. Disc levels:  Mild for age multilevel bony degenerative change. Upper chest: Visualized lung apices are clear. Other: Heterogeneous enlarged thyroid. IMPRESSION: 1. No evidence of acute abnormality intracranially or in the cervical spine. 2. Right scalp contusion without fracture. 3. Heterogeneous enlarged thyroid. Recommend thyroid US (ref: J Am Coll Radiol. 2015 Feb;12(2): 143-50). Electronically Signed   By: Feliberto Harts M.D.   On: 11/08/2023 18:21        Scheduled Meds:  enoxaparin (LOVENOX) injection  40 mg Subcutaneous Q24H   senna-docusate  1 tablet Oral BID   traZODone  50-100 mg Oral QHS   Continuous Infusions:   LOS: 1 day     Tresa Moore, MD [facility]   If 7PM-7AM, please contact night-coverage  11/10/2023, 12:19 PM

## 2023-11-11 DIAGNOSIS — E440 Moderate protein-calorie malnutrition: Secondary | ICD-10-CM | POA: Insufficient documentation

## 2023-11-11 DIAGNOSIS — I10 Essential (primary) hypertension: Secondary | ICD-10-CM | POA: Diagnosis not present

## 2023-11-11 DIAGNOSIS — W19XXXA Unspecified fall, initial encounter: Secondary | ICD-10-CM | POA: Diagnosis not present

## 2023-11-11 DIAGNOSIS — Y92009 Unspecified place in unspecified non-institutional (private) residence as the place of occurrence of the external cause: Secondary | ICD-10-CM | POA: Diagnosis not present

## 2023-11-11 MED ORDER — SENNOSIDES-DOCUSATE SODIUM 8.6-50 MG PO TABS: 1.0000 | ORAL_TABLET | Freq: Two times a day (BID) | ORAL | 0 refills | Status: DC

## 2023-11-11 MED ORDER — ASPIRIN 81 MG PO TBEC: 81.0000 mg | DELAYED_RELEASE_TABLET | Freq: Two times a day (BID) | ORAL | 0 refills | Status: AC

## 2023-11-11 MED ORDER — OXYCODONE HCL 5 MG PO TABS: 5.0000 mg | ORAL_TABLET | Freq: Four times a day (QID) | ORAL | 0 refills | Status: DC | PRN

## 2023-11-11 NOTE — Progress Notes (Signed)
Physical Therapy Treatment Patient Details Name: Mary Mosley MRN: [id number] DOB: 10-Aug-1941 Today's Date: 11/11/2023   History of Present Illness Pt is an 82 y.o. female presenting to hospital 11/08/23 s/p fall.  Imaging showing "Angulated, impacted transcervical fracture of the right femoral neck".  Pt admitted with fall, R femur fx, and dizziness.  Pt s/p R hip hemiarthroplasty 11/09/23.  PMH includes htn, OSA, osteopenia.    PT Comments  Pt was long sitting in bed upon arrival. She is A and O x 4. Agrees to session and remains motivated/cooperative. PT endorse 2/10 pain at rest that elevated to 5/10 in wt bearing. She did not want to take any pain medications pre or post session. " Its tolerable." Pt required min assist to safely exit bed. Once seated EOB, pt is safely able to stand and ambulate with use of RW. Author reviewed hip precautions throughout session with pt easily adhering. Lengthy discussion with pt/pt's son/ and pt's daughter in law about DC disposition. Pt is progressing well however family states unable to provide pt with 24/7 assistance. Pt's son requesting STR placement. Daughter in law does endorse being off for next 2 weeks. DC recs updated. Author will return later this date and perform stairs to simulate home environment.      If plan is discharge home, recommend the following: A little help with walking and/or transfers;A little help with bathing/dressing/bathroom;Assistance with cooking/housework;Assist for transportation;Help with stairs or ramp for entrance     Equipment Recommendations  Rolling walker (2 wheels);BSC/3in1       Precautions / Restrictions Precautions Precautions: Posterior Hip;Fall Precaution Booklet Issued: Yes (comment) Restrictions Weight Bearing Restrictions Per Provider Order: Yes RLE Weight Bearing Per Provider Order: Weight bearing as tolerated     Mobility  Bed Mobility Overal bed mobility: Needs Assistance Bed Mobility: Supine to  Sit  Supine to sit: Min assist  General bed mobility comments: Pt required min assist to safely exit bed. Vcs for technique/ sequencing to adhere to hip precautions.    Transfers Overall transfer level: Needs assistance Equipment used: Rolling walker (2 wheels) Transfers: Sit to/from Stand Sit to Stand: Supervision, Contact guard assist  General transfer comment: Pt stood from EOB with CGA but no physical assistance to stand from [facility] after urination.    Ambulation/Gait Ambulation/Gait assistance: Supervision Gait Distance (Feet): 150 Feet Assistive device: Rolling walker (2 wheels) Gait Pattern/deviations: Step-to pattern, Step-through pattern, Antalgic Gait velocity: decreased  General Gait Details: Pt started gai training with slow cautious step to gait pattern however was able to progress to step through gait with vcs for sequencing    Balance Overall balance assessment: Needs assistance Sitting-balance support: No upper extremity supported, Feet supported Sitting balance-Leahy Scale: Normal     Standing balance support: Bilateral upper extremity supported, During functional activity Standing balance-Leahy Scale: Fair Standing balance comment: pt is reliant on RW for dynamic standing activity       Cognition Arousal: Alert Behavior During Therapy: WFL for tasks assessed/performed Overall Cognitive Status: Within Functional Limits for tasks assessed      General Comments: Pt is A and O x 4. has baseline doubhle vision. cautious throughout session.               Pertinent Vitals/Pain Pain Assessment Pain Assessment: 0-10 Pain Score: 5  Pain Location: R hip Pain Descriptors / Indicators: Sore, Discomfort Pain Intervention(s): Limited activity within patient's tolerance, Monitored during session, Repositioned     PT Goals (current goals can now  be found in the care plan section) Acute Rehab PT Goals Patient Stated Goal: Go home when safe Progress towards PT  goals: Progressing toward goals    Frequency    BID       AM-PAC PT "6 Clicks" Mobility   Outcome Measure  Help needed turning from your back to your side while in a flat bed without using bedrails?: None Help needed moving from lying on your back to sitting on the side of a flat bed without using bedrails?: A Little Help needed moving to and from a bed to a chair (including a wheelchair)?: A Little Help needed standing up from a chair using your arms (e.g., wheelchair or bedside chair)?: A Little Help needed to walk in hospital room?: A Little Help needed climbing 3-5 steps with a railing? : A Little 6 Click Score: 19    End of Session   Activity Tolerance: Patient tolerated treatment well Patient left: in chair;with call bell/phone within reach;with chair alarm set;with family/visitor present;with SCD's reapplied;Other (comment) Nurse Communication: Mobility status;Precautions;Weight bearing status PT Visit Diagnosis: Other abnormalities of gait and mobility (R26.89);Muscle weakness (generalized) (M62.81);History of falling (Z91.81);Pain Pain - Right/Left: Right Pain - part of body: Hip     Time: 0750-0820 PT Time Calculation (min) (ACUTE ONLY): 30 min  Charges:    $Gait Training: 8-22 mins $Therapeutic Activity: 8-22 mins PT General Charges $$ ACUTE PT VISIT: 1 Visit                     Jetta Lout PTA 11/11/23, 8:37 AM

## 2023-11-11 NOTE — TOC Transition Note (Addendum)
Transition of Care [facility]) - Discharge Note   Patient Details  Name: Mary Mosley MRN: [id number] Date of Birth: [birth year]  Transition of [facility] CM/SW Contact:  Bing Quarry, RN Phone Number: 11/11/2023, 11:23 AM   Clinical Narrative: 12/21: Sherron Monday with son regarding start of service date for [facility] service to begin 12/26 due to [facility] staffing and he was okay with that and stated they were able to provider the assistance needed. PT just completed another treatment with patient and son stated patient did "really well". DME to be delivered to room and explained this to son. Son is at bedside and will transport patient to home. Patient normally lives alone. DIL will be helping out for 2 weeks then son will help at night and overnight if needed after that.   Please contact TOC if any changed needed in this discharge plan prior to discharge today.   Update: RN from unit notified RN CM that DME has not yet been delivered. Several contacts with on call representative and as of 5 pm it is still not delivered. Rep said manager will call patient to explain/apologize give a new ETA on the DME. Supposed to be within about a 2 hour range after ordering. Was ordered at 1130 this am. It is not 502 pm. Updated unit RN. Gabriel Cirri RN CM 5 pm 11/11/23.    Gabriel Cirri MSN RN CM  Care Management Department.  [place]  [facility] Direct Dial: [phone] Main Office Phone: [phone] Weekends Only         Final next level of care: Home w Home Health Services Barriers to Discharge: No Barriers Identified, Barriers Resolved   Patient Goals and CMS Choice Patient states their goals for this hospitalization and ongoing recovery are:: to be able to go home and live independently again. CMS Medicare.gov Compare Post Acute Care list provided to:: Patient Choice offered to / list presented to : Patient [place] ownership interest in [facility].provided to::  (NA)    Discharge  Placement                       Discharge Plan and Services Additional resources added to the After Visit Summary for   In-house Referral: Clinical Social Work   Post Acute Care Choice: NA          DME Arranged: 3-N-1, Walker rolling DME Agency: [employer] Date DME Agency Contacted: 11/11/23 Time DME Agency Contacted: [id number] Representative spoke with at DME Agency: Selena Batten HH Arranged: PT, OT, Nurse's Aide HH Agency: [facility] Date [facility] Agency Contacted: 11/11/23 Time HH Agency Contacted: [phone] Representative spoke with at [facility] Agency: Kandee Keen (Start of Service 11/16/23.)  Social Drivers of Health (SDOH) Interventions SDOH Screenings   Food Insecurity: No Food Insecurity (11/10/2023)  Housing: Low Risk  (11/09/2023)  Transportation Needs: No Transportation Needs (11/09/2023)  Utilities: Not At Risk (12/31/2022)  Alcohol Screen: Low Risk  (09/13/2022)  Depression (PHQ2-9): Low Risk  (02/06/2023)  Financial Resource Strain: Low Risk  (06/16/2023)  Physical Activity: Sufficiently Active (06/16/2023)  Social Connections: Moderately Integrated (06/16/2023)  Stress: No Stress Concern Present (06/16/2023)  Tobacco Use: Low Risk  (11/08/2023)     Readmission Risk Interventions    11/09/2023   11:31 AM  Readmission Risk Prevention Plan  Post Dischage Appt Complete  Medication Screening Complete  Transportation Screening Complete

## 2023-11-11 NOTE — Plan of Care (Signed)

## 2023-11-11 NOTE — Progress Notes (Addendum)
Physical Therapy Treatment Patient Details Name: Mary Mosley MRN: [id number] DOB: 28-Feb-1941 Today's Date: 11/11/2023   History of Present Illness Pt is an 82 y.o. female presenting to hospital 11/08/23 s/p fall.  Imaging showing "Angulated, impacted transcervical fracture of the right femoral neck".  Pt admitted with fall, R femur fx, and dizziness.  Pt s/p R hip hemiarthroplasty 11/09/23.  PMH includes htn, OSA, osteopenia.    PT Comments  Author returned for BID/second session. Pt was in recliner and remains cooperative and pleasant. Family is now agreeable to DC home later this date. MD is aware. Pt demonstrated safe abilities to stand, ambulate, and perform stairs to simulate home entry/exit. Author educated family on expectations and importance of continued OOB activity. Pt is cleared from an acute PT standpoint for safe DC home with HHPT to continue to progress pt to maximal independence .     If plan is discharge home, recommend the following: A little help with walking and/or transfers;A little help with bathing/dressing/bathroom;Assistance with cooking/housework;Assist for transportation;Help with stairs or ramp for entrance     Equipment Recommendations  Rolling walker (2 wheels);BSC/3in1       Precautions / Restrictions Precautions Precautions: Posterior Hip;Fall Precaution Booklet Issued: Yes (comment) Restrictions Weight Bearing Restrictions Per Provider Order: Yes RLE Weight Bearing Per Provider Order: Weight bearing as tolerated     Mobility  Bed Mobility Overal bed mobility: Needs Assistance Bed Mobility: Supine to Sit  Supine to sit: Min assist  General bed mobility comments: in recliner pre/post session    Transfers Overall transfer level: Needs assistance Equipment used: Rolling walker (2 wheels) Transfers: Sit to/from Stand Sit to Stand: Supervision  General transfer comment: Pt stood from EOB with CGA but no physical assistance to stand from [facility] after  urination.    Ambulation/Gait Ambulation/Gait assistance: Supervision Gait Distance (Feet): 300 Feet Assistive device: Rolling walker (2 wheels) Gait Pattern/deviations: Step-to pattern, Step-through pattern Gait velocity: decreased  General Gait Details: pt demonstrated safe steady gait > 250 ft with RW. no LOB noted   Stairs Stairs: Yes Stairs assistance: Supervision Stair Management: No rails, Step to pattern, Backwards, Forwards, With walker Number of Stairs: 2 General stair comments: Pt performed then Thereasa Parkin tought family proper stair performance with use of RW. Both pt/ pt's family state confidenec in safely getting in/out of home    Balance Overall balance assessment: Needs assistance Sitting-balance support: No upper extremity supported, Feet supported Sitting balance-Leahy Scale: Normal     Standing balance support: Bilateral upper extremity supported, During functional activity Standing balance-Leahy Scale: Good Standing balance comment: pt is reliant on RW for dynamic standing activity       Cognition Arousal: Alert Behavior During Therapy: WFL for tasks assessed/performed Overall Cognitive Status: Within Functional Limits for tasks assessed  General Comments: Pt is A and O x 4. has baseline doubhle vision. cautious throughout session.           General Comments General comments (skin integrity, edema, etc.): reviewed car transfers, importance of HEP + routine mobility, and what to expect form HHPT      Pertinent Vitals/Pain Pain Assessment Pain Assessment: 0-10 Pain Score: 3  Pain Location: R hip Pain Descriptors / Indicators: Sore, Discomfort Pain Intervention(s): Limited activity within patient's tolerance, Monitored during session, Premedicated before session, Repositioned     PT Goals (current goals can now be found in the care plan section) Acute Rehab PT Goals Patient Stated Goal: go home Progress towards PT goals: Progressing toward  goals     Frequency    BID       AM-PAC PT "6 Clicks" Mobility   Outcome Measure  Help needed turning from your back to your side while in a flat bed without using bedrails?: None Help needed moving from lying on your back to sitting on the side of a flat bed without using bedrails?: A Little Help needed moving to and from a bed to a chair (including a wheelchair)?: A Little Help needed standing up from a chair using your arms (e.g., wheelchair or bedside chair)?: A Little Help needed to walk in hospital room?: A Little Help needed climbing 3-5 steps with a railing? : A Little 6 Click Score: 19    End of Session   Activity Tolerance: Patient tolerated treatment well Patient left: in chair;with call bell/phone within reach;with nursing/sitter in room Nurse Communication: Mobility status PT Visit Diagnosis: Other abnormalities of gait and mobility (R26.89);Muscle weakness (generalized) (M62.81);History of falling (Z91.81);Pain Pain - Right/Left: Right Pain - part of body: Hip     Time: 1610-9604 PT Time Calculation (min) (ACUTE ONLY): 17 min  Charges:    $Gait Training: 8-22 mins $Therapeutic Activity: 8-22 mins PT General Charges $$ ACUTE PT VISIT: 1 Visit                     Jetta Lout PTA 11/11/23, 11:30 AM

## 2023-11-11 NOTE — Discharge Summary (Signed)
Physician Discharge Summary  Mary Mosley HQI:[id number] DOB: 09-10-1941 DOA: 11/08/2023  PCP: Malva Limes, MD  Admit date: 11/08/2023 Discharge date: 11/11/2023  Admitted From: Home Disposition: Home with home health  Recommendations for Outpatient Follow-up:  Follow up with PCP in 1-2 weeks Follow-up orthopedics 2 weeks  Home Health: Yes PT OT aide Equipment/Devices: Rear wheel walker, 3 in 1  Discharge Condition: Stable CODE STATUS: Full Diet recommendation: Regular  Brief/Interim Summary:   82 y.o. female with medical history significant for essential hypertension, allergies to multiple medications including amlodipine, diltiazem, Fosamax, gabapentin, HCTZ, penicillin coming today for fall at home in her garden on her right side, patient did strike her head on the right side, but no loss of consciousness and had a laceration on arrival to the ED.  Patient has intermittent dizziness history in the past as well.       Discharge Diagnoses:  Principal Problem:   Fall at home, initial encounter Active Problems:   Femur fracture, right (HCC)   Dizziness   Primary hypertension   Malnutrition of moderate degree  Right-sided femur fracture Mechanical fall versus syncope Patient apparently fell at home when gardening.  She states she is she was bent over for an extended period of time and then rose up too quickly.  As a result she did experience dizziness.  Unclear whether she truly lost consciousness.  Suspect orthostasis and at least presyncopal event.  Patient postoperative day 2 status post ORIF right hip.  Tolerated procedure well.  Had BM on postoperative day #1. Plan: Did well with physical therapy.  Okay for discharge home with home services.  Full scope of home services ordered.  Services to start 12/26 given the holiday.  Aspirin 81 twice daily x 6 weeks per orthopedic recommendations for VTE prophylaxis.  Follow-up outpatient PCP 1 to 2 weeks.  Follow-up outpatient  orthopedics 2 weeks.   Essential hypertension Patient does not appear to be on any home medications Blood pressure elevated but may be secondary to pain Follow-up outpatient PCP to discuss antihypertensive strategy  Discharge Instructions  Discharge Instructions     Diet - low sodium heart healthy   Complete by: As directed    Increase activity slowly   Complete by: As directed       Allergies as of 11/11/2023       Reactions   Amlodipine Itching   Diltiazem Hcl Itching   Fosamax [alendronate Sodium] Itching   Gabapentin    Hydrochlorothiazide Itching   Penicillins Hives        Medication List     STOP taking these medications    Maxitrol 3.5-10000-0.1 Oint Generic drug: neomycin-polymyxin-dexameth       TAKE these medications    aspirin EC 81 MG tablet Take 1 tablet (81 mg total) by mouth 2 (two) times daily. Swallow whole.   cholecalciferol 1000 units tablet Commonly known as: VITAMIN D Take 1 tablet by mouth daily.   naproxen sodium 220 MG tablet Commonly known as: ALEVE Take 220 mg by mouth daily as needed. Back and muscle pain 24 hr tablet   oxyCODONE 5 MG immediate release tablet Commonly known as: Roxicodone Take 1 tablet (5 mg total) by mouth every 6 (six) hours as needed for severe pain (pain score 7-10) or breakthrough pain.   senna-docusate 8.6-50 MG tablet Commonly known as: Senokot-S Take 1 tablet by mouth 2 (two) times daily.   traZODone 50 MG tablet Commonly known as: DESYREL Take 1-2 tablets (  50-100 mg total) by mouth at bedtime.               Durable Medical Equipment  (From admission, onward)           Start     Ordered   11/11/23 1034  For home use only DME Walker rolling  Once       Question Answer Comment  Walker: With 5 Inch Wheels   Patient needs a walker to treat with the following condition Weakness      11/11/23 1033   11/11/23 1034  For home use only DME 3 n 1  Once        11/11/23 1033             Follow-up Information     Fisher, Demetrios Isaacs, MD. Schedule an appointment as soon as possible for a visit in 1 week(s).   Specialty: Family Medicine Contact information: [address] [place] [address] [phone]         Oliver Barre, MD. Schedule an appointment as soon as possible for a visit on 11/30/2023.   Specialties: Orthopedic Surgery, Sports Medicine Contact information: [address] [place] [address] [phone]                Allergies  Allergen Reactions   Amlodipine Itching   Diltiazem Hcl Itching   Fosamax [Alendronate Sodium] Itching   Gabapentin    Hydrochlorothiazide Itching   Penicillins Hives    Consultations: Orthopedics   Procedures/Studies: DG HIP UNILAT WITH PELVIS 2-3 VIEWS RIGHT Result Date: 11/09/2023 CLINICAL DATA:  Right hip replacement. EXAM: DG HIP (WITH OR WITHOUT PELVIS) 2-3V RIGHT COMPARISON:  CT right hip and right hip x-rays from yesterday. FINDINGS: The right hip demonstrates a hemiarthroplasty without evidence of hardware failure or complication. There is no fracture or dislocation. The alignment is anatomic. Post-surgical changes noted in the surrounding soft tissues. IMPRESSION: 1. Right hip hemiarthroplasty without immediate postoperative complication. Electronically Signed   By: Obie Dredge M.D.   On: 11/09/2023 19:41   CT Hip Right Wo Contrast Result Date: 11/08/2023 CLINICAL DATA:  Fracture evaluation.  Pain after fall EXAM: CT OF THE RIGHT HIP WITHOUT CONTRAST TECHNIQUE: Multidetector CT imaging of the right hip was performed according to the standard protocol. Multiplanar CT image reconstructions were also generated. RADIATION DOSE REDUCTION: This exam was performed according to the departmental dose-optimization program which includes automated exposure control, adjustment of the mA and/or kV according to patient size and/or use of iterative reconstruction technique.  COMPARISON:  Radiographs earlier today FINDINGS: Bones/Joint/Cartilage Subcapital fracture of the right femoral neck. The femoral shaft is displaced superiorly and laterally. The femoral head remains seated in the acetabulum. Ligaments Suboptimally assessed by CT. Muscles and Tendons No acute abnormality. Soft tissues Unremarkable. IMPRESSION: Subcapital fracture of the right femoral neck. Electronically Signed   By: Minerva Fester M.D.   On: 11/08/2023 22:42   DG Chest Portable 1 View Result Date: 11/08/2023 CLINICAL DATA:  Fall, preop EXAM: PORTABLE CHEST 1 VIEW COMPARISON:  None available. FINDINGS: Lungs are essentially clear. Mild linear scarring/atelectasis in the left lower lobe. No pleural effusion or pneumothorax. The heart is normal in size.  Thoracic aortic atherosclerosis. IMPRESSION: No acute cardiopulmonary disease. Electronically Signed   By: Charline Bills M.D.   On: 11/08/2023 22:15   DG Hip Unilat With Pelvis 2-3 Views Right Result Date: 11/08/2023 CLINICAL DATA:  Fall, pain EXAM: DG HIP (WITH  OR WITHOUT PELVIS) 2-3V RIGHT COMPARISON:  None Available. FINDINGS: Angulated, impacted transcervical fracture of the right femoral neck. No other displaced fractures of the pelvis or proximal left femur seen in single frontal view. Nonobstructive pattern of overlying bowel gas. IMPRESSION: Angulated, impacted transcervical fracture of the right femoral neck. Electronically Signed   By: Jearld Lesch M.D.   On: 11/08/2023 19:18   CT Head Wo Contrast Result Date: 11/08/2023 CLINICAL DATA:  Head trauma, moderate-severe; Neck trauma (Age >= 65y) EXAM: CT HEAD WITHOUT CONTRAST CT CERVICAL SPINE WITHOUT CONTRAST TECHNIQUE: Multidetector CT imaging of the head and cervical spine was performed following the standard protocol without intravenous contrast. Multiplanar CT image reconstructions of the cervical spine were also generated. RADIATION DOSE REDUCTION: This exam was performed according to the  departmental dose-optimization program which includes automated exposure control, adjustment of the mA and/or kV according to patient size and/or use of iterative reconstruction technique. COMPARISON:  CT head and CT cervical spine Apr 13, 2022. FINDINGS: CT HEAD FINDINGS Brain: No evidence of acute infarction, hemorrhage, hydrocephalus, extra-axial collection or mass lesion/mass effect. Vascular: No hyperdense vessel or unexpected calcification. Skull: No acute fracture.  Right scalp contusion. Sinuses/Orbits: Clear sinuses.  No acute orbital findings. CT CERVICAL SPINE FINDINGS Alignment: No substantial sagittal subluxation. Skull base and vertebrae: No acute fracture. No primary bone lesion or focal pathologic process. Soft tissues and spinal canal: No prevertebral fluid or swelling. No visible canal hematoma. Disc levels:  Mild for age multilevel bony degenerative change. Upper chest: Visualized lung apices are clear. Other: Heterogeneous enlarged thyroid. IMPRESSION: 1. No evidence of acute abnormality intracranially or in the cervical spine. 2. Right scalp contusion without fracture. 3. Heterogeneous enlarged thyroid. Recommend thyroid US (ref: J Am Coll Radiol. 2015 Feb;12(2): 143-50). Electronically Signed   By: Feliberto Harts M.D.   On: 11/08/2023 18:21   CT Cervical Spine Wo Contrast Result Date: 11/08/2023 CLINICAL DATA:  Head trauma, moderate-severe; Neck trauma (Age >= 65y) EXAM: CT HEAD WITHOUT CONTRAST CT CERVICAL SPINE WITHOUT CONTRAST TECHNIQUE: Multidetector CT imaging of the head and cervical spine was performed following the standard protocol without intravenous contrast. Multiplanar CT image reconstructions of the cervical spine were also generated. RADIATION DOSE REDUCTION: This exam was performed according to the departmental dose-optimization program which includes automated exposure control, adjustment of the mA and/or kV according to patient size and/or use of iterative  reconstruction technique. COMPARISON:  CT head and CT cervical spine Apr 13, 2022. FINDINGS: CT HEAD FINDINGS Brain: No evidence of acute infarction, hemorrhage, hydrocephalus, extra-axial collection or mass lesion/mass effect. Vascular: No hyperdense vessel or unexpected calcification. Skull: No acute fracture.  Right scalp contusion. Sinuses/Orbits: Clear sinuses.  No acute orbital findings. CT CERVICAL SPINE FINDINGS Alignment: No substantial sagittal subluxation. Skull base and vertebrae: No acute fracture. No primary bone lesion or focal pathologic process. Soft tissues and spinal canal: No prevertebral fluid or swelling. No visible canal hematoma. Disc levels:  Mild for age multilevel bony degenerative change. Upper chest: Visualized lung apices are clear. Other: Heterogeneous enlarged thyroid. IMPRESSION: 1. No evidence of acute abnormality intracranially or in the cervical spine. 2. Right scalp contusion without fracture. 3. Heterogeneous enlarged thyroid. Recommend thyroid US (ref: J Am Coll Radiol. 2015 Feb;12(2): 143-50). Electronically Signed   By: Feliberto Harts M.D.   On: 11/08/2023 18:21      Subjective: Seen and examined on the day of discharge.  Stable no distress.  Appropriate for discharge home.  Discharge Exam:  Vitals:   11/11/23 0459 11/11/23 0728  BP: (!) 148/72 (!) 171/88  Pulse: 97 (!) 106  Resp: 16 16  Temp: 98.5 F (36.9 C) 99.5 F (37.5 C)  SpO2: 95% 97%   Vitals:   11/10/23 2215 11/11/23 0031 11/11/23 0459 11/11/23 0728  BP: (!) 129/56 130/65 (!) 148/72 (!) 171/88  Pulse: 80 97 97 (!) 106  Resp: 18 18 16 16   Temp: 98.3 F (36.8 C) 99.7 F (37.6 C) 98.5 F (36.9 C) 99.5 F (37.5 C)  TempSrc:      SpO2: 99% 95% 95% 97%  Weight:      Height:        General: Pt is alert, awake, not in acute distress Cardiovascular: RRR, S1/S2 +, no rubs, no gallops Respiratory: CTA bilaterally, no wheezing, no rhonchi Abdominal: Soft, NT, ND, bowel sounds  + Extremities: no edema, no cyanosis    The results of significant diagnostics from this hospitalization (including imaging, microbiology, ancillary and laboratory) are listed below for reference.     Microbiology: No results found for this or any previous visit (from the past 240 hours).   Labs: BNP (last 3 results) Recent Labs    11/09/23 0540  BNP 413.0*   Basic Metabolic Panel: Recent Labs  Lab 11/08/23 2122 11/09/23 0540 11/10/23 0823  NA 135 135 136  K 4.0 3.7 4.2  CL 101 101 104  CO2 24 25 23   GLUCOSE 125* 132* 94  BUN 18 15 18   CREATININE 0.69 0.71 0.84  CALCIUM 10.1 9.7 8.8*   Liver Function Tests: Recent Labs  Lab 11/09/23 0540  AST 29  ALT 21  ALKPHOS 56  BILITOT 1.0  PROT 7.3  ALBUMIN 4.7   No results for input(s): "LIPASE", "AMYLASE" in the last 168 hours. No results for input(s): "AMMONIA" in the last 168 hours. CBC: Recent Labs  Lab 11/08/23 2122 11/09/23 0540 11/09/23 1614 11/10/23 0823  WBC 10.8* 8.4  --  9.3  NEUTROABS 9.7*  --   --  7.8*  HGB 13.5 13.6 12.6 11.3*  HCT 40.4 40.6 38.0 32.8*  MCV 94.2 91.4  --  92.4  PLT 168 154  --  146*   Cardiac Enzymes: No results for input(s): "CKTOTAL", "CKMB", "CKMBINDEX", "TROPONINI" in the last 168 hours. BNP: Invalid input(s): "POCBNP" CBG: No results for input(s): "GLUCAP" in the last 168 hours. D-Dimer No results for input(s): "DDIMER" in the last 72 hours. Hgb A1c No results for input(s): "HGBA1C" in the last 72 hours. Lipid Profile No results for input(s): "CHOL", "HDL", "LDLCALC", "TRIG", "CHOLHDL", "LDLDIRECT" in the last 72 hours. Thyroid function studies Recent Labs    11/09/23 0540  TSH 0.515   Anemia work up Recent Labs    11/09/23 0732  VITAMINB12 617   Urinalysis    Component Value Date/Time   COLORURINE STRAW (A) 11/09/2023 0138   APPEARANCEUR CLEAR (A) 11/09/2023 0138   LABSPEC 1.009 11/09/2023 0138   PHURINE 7.0 11/09/2023 0138   GLUCOSEU NEGATIVE  11/09/2023 0138   HGBUR NEGATIVE 11/09/2023 0138   BILIRUBINUR NEGATIVE 11/09/2023 0138   BILIRUBINUR negative 07/31/2019 1242   KETONESUR 5 (A) 11/09/2023 0138   PROTEINUR NEGATIVE 11/09/2023 0138   UROBILINOGEN 0.2 07/31/2019 1242   NITRITE NEGATIVE 11/09/2023 0138   LEUKOCYTESUR NEGATIVE 11/09/2023 0138   Sepsis Labs Recent Labs  Lab 11/08/23 2122 11/09/23 0540 11/10/23 0823  WBC 10.8* 8.4 9.3   Microbiology No results found for this or any previous visit (from  the past 240 hours).   Time coordinating discharge: Over 30 minutes  SIGNED:   Tresa Moore, MD  [facility] 11/11/2023, 12:40 PM Pager   If 7PM-7AM, please contact night-coverage

## 2023-11-16 ENCOUNTER — Telehealth: Payer: Self-pay | Admitting: Family Medicine

## 2023-11-16 NOTE — Telephone Encounter (Signed)
Mary Mosley with [facility] called to say they have a delay by the patient in starting PT  Dec 30 th is the start date.

## 2023-11-20 DIAGNOSIS — S72011D Unspecified intracapsular fracture of right femur, subsequent encounter for closed fracture with routine healing: Secondary | ICD-10-CM | POA: Diagnosis not present

## 2023-11-20 DIAGNOSIS — E049 Nontoxic goiter, unspecified: Secondary | ICD-10-CM | POA: Diagnosis not present

## 2023-11-20 DIAGNOSIS — Z96641 Presence of right artificial hip joint: Secondary | ICD-10-CM | POA: Diagnosis not present

## 2023-11-20 DIAGNOSIS — S72031D Displaced midcervical fracture of right femur, subsequent encounter for closed fracture with routine healing: Secondary | ICD-10-CM | POA: Diagnosis not present

## 2023-11-20 DIAGNOSIS — I10 Essential (primary) hypertension: Secondary | ICD-10-CM | POA: Diagnosis not present

## 2023-11-21 DIAGNOSIS — S72011D Unspecified intracapsular fracture of right femur, subsequent encounter for closed fracture with routine healing: Secondary | ICD-10-CM | POA: Diagnosis not present

## 2023-11-21 DIAGNOSIS — E049 Nontoxic goiter, unspecified: Secondary | ICD-10-CM | POA: Diagnosis not present

## 2023-11-21 DIAGNOSIS — S72031D Displaced midcervical fracture of right femur, subsequent encounter for closed fracture with routine healing: Secondary | ICD-10-CM | POA: Diagnosis not present

## 2023-11-21 DIAGNOSIS — Z96641 Presence of right artificial hip joint: Secondary | ICD-10-CM | POA: Diagnosis not present

## 2023-11-21 DIAGNOSIS — I10 Essential (primary) hypertension: Secondary | ICD-10-CM | POA: Diagnosis not present

## 2023-11-24 ENCOUNTER — Ambulatory Visit: Payer: Medicare Other | Admitting: Family Medicine

## 2023-11-24 VITALS — BP 152/65 | HR 86 | Resp 20 | Ht 67.0 in | Wt 123.0 lb

## 2023-11-24 DIAGNOSIS — S728X1D Other fracture of right femur, subsequent encounter for closed fracture with routine healing: Secondary | ICD-10-CM

## 2023-11-24 MED ORDER — RISEDRONATE SODIUM 35 MG PO TABS: 35.0000 mg | ORAL_TABLET | ORAL | 2 refills | Status: AC

## 2023-11-24 NOTE — Patient Instructions (Signed)
 Marland Kitchen  Please review the attached list of medications and notify my office if there are any errors.   . Please bring all of your medications to every appointment so we can make sure that our medication list is the same as yours.

## 2023-11-24 NOTE — Progress Notes (Signed)
 Established patient visit   Patient: Mary Mosley   DOB: Jun 10, 1941   83 y.o. Female  MRN: [id number] Visit Date: 11/24/2023  Today's healthcare provider: Nancyann Perry, MD   Chief Complaint  Patient presents with   Hospitalization Follow-up        Subjective    Discussed the use of AI scribe software for clinical note transcription with the patient, who gave verbal consent to proceed.  History of Present Illness   The patient, accompanied by their daughter, presents approximately three weeks post hip fracture sustained on December 18th. The fall was precipitated by a bout of dizziness, a symptom she frequently experiences, particularly when moving her head quickly. The patient was gardening at the time, and upon standing up from a bent position, she experienced dizziness and fell. She believes her fall was exacerbated by an existing eye condition.   The patient has been managing post-surgery without the need for pain medication, only occasionally taking Tylenol  for head pain. She reports no pain at the incision site and has been able to move around, including getting up from bed and performing light tasks in the kitchen. She has an upcoming appointment with the surgeon next week. patient has been able to shower with the bandages on, as advised by the surgeon. The bandages were examined during the consultation and were deemed to be healing well.  The patient has a history of  osteopenia and is on vitamin D . she was prescribed alendronate  in 2019 but stopped due to itching       Medications: Outpatient Medications Prior to Visit  Medication Sig   aspirin  EC 81 MG tablet Take 1 tablet (81 mg total) by mouth 2 (two) times daily. Swallow whole.   bisacodyl  (DULCOLAX) 5 MG EC tablet Take 5 mg by mouth daily as needed for moderate constipation.   cholecalciferol (VITAMIN D ) 1000 UNITS tablet Take 1 tablet by mouth daily.   naproxen sodium (ALEVE) 220 MG tablet Take 220 mg by  mouth daily as needed. Back and muscle pain 24 hr tablet   traZODone  (DESYREL ) 50 MG tablet Take 1-2 tablets (50-100 mg total) by mouth at bedtime.   [DISCONTINUED] oxyCODONE  (ROXICODONE ) 5 MG immediate release tablet Take 1 tablet (5 mg total) by mouth every 6 (six) hours as needed for severe pain (pain score 7-10) or breakthrough pain. (Patient not taking: Reported on 11/24/2023)   [DISCONTINUED] senna-docusate (SENOKOT-S) 8.6-50 MG tablet Take 1 tablet by mouth 2 (two) times daily. (Patient not taking: Reported on 11/24/2023)   No facility-administered medications prior to visit.   Review of Systems     Objective    BP (!) 152/65   Pulse 86   Resp 20   Ht 5' 7 (1.702 m)   Wt 123 lb (55.8 kg)   BMI 19.26 kg/m   Physical Exam Ambulating with walker. Able to stand without assistance. Wound clean dry and intact.    Assessment & Plan        Hip Fracture Three weeks post-fall with good mobility and minimal pain. No complications noted on visual inspection of surgical site. Follow-up with surgeon scheduled for next week. -Continue current management and follow-up with surgeon as planned.  Osteopenia Recent hip fracture and previous bone density test indicating borderline osteoporosis. Discussed the benefits of starting bisphosphonate to reduce of future fractures. Previous trial of alendronate  may have caused itching. will try weekly risedronate .   -Continue Vitamin D3 supplementation   Follow up  orthopedics next week as scheduled.    Follow up her in 3 months for BP check and follow risedronate .    Return in about 3 months (around 02/22/2024) for Hypertension.      Nancyann Perry, MD  [facility] [phone] (phone) [phone] (fax)  [facility]

## 2023-11-25 DIAGNOSIS — S72031D Displaced midcervical fracture of right femur, subsequent encounter for closed fracture with routine healing: Secondary | ICD-10-CM | POA: Diagnosis not present

## 2023-11-25 DIAGNOSIS — I10 Essential (primary) hypertension: Secondary | ICD-10-CM | POA: Diagnosis not present

## 2023-11-25 DIAGNOSIS — Z96641 Presence of right artificial hip joint: Secondary | ICD-10-CM | POA: Diagnosis not present

## 2023-11-25 DIAGNOSIS — S72011D Unspecified intracapsular fracture of right femur, subsequent encounter for closed fracture with routine healing: Secondary | ICD-10-CM | POA: Diagnosis not present

## 2023-11-25 DIAGNOSIS — E049 Nontoxic goiter, unspecified: Secondary | ICD-10-CM | POA: Diagnosis not present

## 2023-11-27 DIAGNOSIS — S72031D Displaced midcervical fracture of right femur, subsequent encounter for closed fracture with routine healing: Secondary | ICD-10-CM | POA: Diagnosis not present

## 2023-11-27 DIAGNOSIS — E049 Nontoxic goiter, unspecified: Secondary | ICD-10-CM | POA: Diagnosis not present

## 2023-11-27 DIAGNOSIS — I10 Essential (primary) hypertension: Secondary | ICD-10-CM | POA: Diagnosis not present

## 2023-11-27 DIAGNOSIS — S72011D Unspecified intracapsular fracture of right femur, subsequent encounter for closed fracture with routine healing: Secondary | ICD-10-CM | POA: Diagnosis not present

## 2023-11-27 DIAGNOSIS — Z96641 Presence of right artificial hip joint: Secondary | ICD-10-CM | POA: Diagnosis not present

## 2023-11-29 ENCOUNTER — Other Ambulatory Visit: Payer: Self-pay

## 2023-11-29 ENCOUNTER — Other Ambulatory Visit: Payer: Self-pay | Admitting: Orthopedic Surgery

## 2023-11-29 DIAGNOSIS — F440 Dissociative amnesia: Secondary | ICD-10-CM

## 2023-11-29 DIAGNOSIS — Z96641 Presence of right artificial hip joint: Secondary | ICD-10-CM | POA: Diagnosis not present

## 2023-11-29 DIAGNOSIS — S72011D Unspecified intracapsular fracture of right femur, subsequent encounter for closed fracture with routine healing: Secondary | ICD-10-CM | POA: Diagnosis not present

## 2023-11-29 DIAGNOSIS — E049 Nontoxic goiter, unspecified: Secondary | ICD-10-CM | POA: Diagnosis not present

## 2023-11-29 DIAGNOSIS — G4733 Obstructive sleep apnea (adult) (pediatric): Secondary | ICD-10-CM | POA: Diagnosis not present

## 2023-11-29 DIAGNOSIS — M25551 Pain in right hip: Secondary | ICD-10-CM

## 2023-11-29 DIAGNOSIS — I10 Essential (primary) hypertension: Secondary | ICD-10-CM | POA: Diagnosis not present

## 2023-11-29 DIAGNOSIS — Z7982 Long term (current) use of aspirin: Secondary | ICD-10-CM | POA: Diagnosis not present

## 2023-11-29 DIAGNOSIS — S72031D Displaced midcervical fracture of right femur, subsequent encounter for closed fracture with routine healing: Secondary | ICD-10-CM | POA: Diagnosis not present

## 2023-11-29 DIAGNOSIS — Z9181 History of falling: Secondary | ICD-10-CM | POA: Diagnosis not present

## 2023-11-30 ENCOUNTER — Ambulatory Visit
Admission: RE | Admit: 2023-11-30 | Discharge: 2023-11-30 | Disposition: A | Discharge status: Home / Self Care | Payer: Medicare Other | Source: Ambulatory Visit | Attending: Orthopedic Surgery | Admitting: Orthopedic Surgery

## 2023-11-30 ENCOUNTER — Ambulatory Visit
Admission: RE | Admit: 2023-11-30 | Discharge: 2023-11-30 | Disposition: A | Discharge status: Home / Self Care | Payer: Medicare Other | Attending: Orthopedic Surgery | Admitting: Orthopedic Surgery

## 2023-11-30 ENCOUNTER — Ambulatory Visit (INDEPENDENT_AMBULATORY_CARE_PROVIDER_SITE_OTHER): Payer: Medicare Other | Admitting: Orthopedic Surgery

## 2023-11-30 DIAGNOSIS — Z96641 Presence of right artificial hip joint: Secondary | ICD-10-CM | POA: Diagnosis not present

## 2023-11-30 DIAGNOSIS — I10 Essential (primary) hypertension: Secondary | ICD-10-CM | POA: Diagnosis not present

## 2023-11-30 DIAGNOSIS — S72011D Unspecified intracapsular fracture of right femur, subsequent encounter for closed fracture with routine healing: Secondary | ICD-10-CM | POA: Diagnosis not present

## 2023-11-30 DIAGNOSIS — Z471 Aftercare following joint replacement surgery: Secondary | ICD-10-CM | POA: Diagnosis not present

## 2023-11-30 DIAGNOSIS — S72001A Fracture of unspecified part of neck of right femur, initial encounter for closed fracture: Secondary | ICD-10-CM

## 2023-11-30 DIAGNOSIS — M25551 Pain in right hip: Secondary | ICD-10-CM | POA: Insufficient documentation

## 2023-11-30 DIAGNOSIS — S72031D Displaced midcervical fracture of right femur, subsequent encounter for closed fracture with routine healing: Secondary | ICD-10-CM | POA: Diagnosis not present

## 2023-11-30 DIAGNOSIS — E049 Nontoxic goiter, unspecified: Secondary | ICD-10-CM | POA: Diagnosis not present

## 2023-11-30 NOTE — Progress Notes (Signed)
 Orthopaedic Postop Note  Assessment: Mary Mosley is a 83 y.o. female s/p Right hip hemiarthroplasty  DOS: 11/09/2023  Plan: Sutures trimmed, steri strips placed Continue using hip abduction pillow while in bed until 6 weeks after surgery Continue posterior hip precautions until 3 months postop Continue with DVT prophylaxis for at least 6 weeks after surgery WBAT on the operative extremity Follow up in 4 weeks; call with any issues  Follow-up: No follow-ups on file. XR at next visit: AP pelvis and Right hip  Subjective:  Chief Complaint  Patient presents with   Right Hip - Routine Post Op    Doing well, walking with cane at home.  No meds, no pain.     History of Present Illness: Mary Mosley is a 83 y.o. female who presents following the above stated procedure.  She is doing very well following surgery.  She has returned home.  She is ambulating at home with the assistance of a cane.  Occasional Tylenol , primarily for headaches.  No issues with her surgical incisions.  Home health is working with her.  Review of Systems: No fevers or chills No numbness or tingling No Chest Pain No shortness of breath   Objective: There were no vitals taken for this visit.  Physical Exam:  Alert and oriented.  No acute distress.  Right hip lateral based incision is healing.  No surrounding erythema or drainage.  Mild tenderness to palpation.  She tolerates gentle range of motion of the right hip.  She is able to maintain a straight leg raise.  Sensation is intact distally.  Active motion intact to the EHL/TA.  IMAGING: I personally ordered and reviewed the following images:  XR of the Right hip and AP pelvis demonstrates a left hip arthroplasty in good position.  The hip remains reduced.  There is no evidence of implant subsidence.  No acute fractures are noted.  Impression: Right hip hemiarthroplasty in stable position, without evidence of migration or subsidence compared to  prior XR   Oneil DELENA Horde, MD 11/30/2023 10:41 AM

## 2023-12-02 ENCOUNTER — Encounter: Payer: Self-pay | Admitting: Orthopedic Surgery

## 2023-12-04 DIAGNOSIS — I10 Essential (primary) hypertension: Secondary | ICD-10-CM | POA: Diagnosis not present

## 2023-12-04 DIAGNOSIS — Z96641 Presence of right artificial hip joint: Secondary | ICD-10-CM | POA: Diagnosis not present

## 2023-12-04 DIAGNOSIS — E049 Nontoxic goiter, unspecified: Secondary | ICD-10-CM | POA: Diagnosis not present

## 2023-12-04 DIAGNOSIS — S72011D Unspecified intracapsular fracture of right femur, subsequent encounter for closed fracture with routine healing: Secondary | ICD-10-CM | POA: Diagnosis not present

## 2023-12-04 DIAGNOSIS — S72031D Displaced midcervical fracture of right femur, subsequent encounter for closed fracture with routine healing: Secondary | ICD-10-CM | POA: Diagnosis not present

## 2023-12-07 DIAGNOSIS — I10 Essential (primary) hypertension: Secondary | ICD-10-CM | POA: Diagnosis not present

## 2023-12-07 DIAGNOSIS — S72011D Unspecified intracapsular fracture of right femur, subsequent encounter for closed fracture with routine healing: Secondary | ICD-10-CM | POA: Diagnosis not present

## 2023-12-07 DIAGNOSIS — Z96641 Presence of right artificial hip joint: Secondary | ICD-10-CM | POA: Diagnosis not present

## 2023-12-07 DIAGNOSIS — S72031D Displaced midcervical fracture of right femur, subsequent encounter for closed fracture with routine healing: Secondary | ICD-10-CM | POA: Diagnosis not present

## 2023-12-07 DIAGNOSIS — E049 Nontoxic goiter, unspecified: Secondary | ICD-10-CM | POA: Diagnosis not present

## 2023-12-12 DIAGNOSIS — S72011D Unspecified intracapsular fracture of right femur, subsequent encounter for closed fracture with routine healing: Secondary | ICD-10-CM | POA: Diagnosis not present

## 2023-12-12 DIAGNOSIS — E049 Nontoxic goiter, unspecified: Secondary | ICD-10-CM | POA: Diagnosis not present

## 2023-12-12 DIAGNOSIS — S72031D Displaced midcervical fracture of right femur, subsequent encounter for closed fracture with routine healing: Secondary | ICD-10-CM | POA: Diagnosis not present

## 2023-12-12 DIAGNOSIS — I10 Essential (primary) hypertension: Secondary | ICD-10-CM | POA: Diagnosis not present

## 2023-12-12 DIAGNOSIS — Z96641 Presence of right artificial hip joint: Secondary | ICD-10-CM | POA: Diagnosis not present

## 2023-12-20 DIAGNOSIS — I10 Essential (primary) hypertension: Secondary | ICD-10-CM | POA: Diagnosis not present

## 2023-12-20 DIAGNOSIS — E049 Nontoxic goiter, unspecified: Secondary | ICD-10-CM | POA: Diagnosis not present

## 2023-12-20 DIAGNOSIS — S72031D Displaced midcervical fracture of right femur, subsequent encounter for closed fracture with routine healing: Secondary | ICD-10-CM | POA: Diagnosis not present

## 2023-12-20 DIAGNOSIS — Z96641 Presence of right artificial hip joint: Secondary | ICD-10-CM | POA: Diagnosis not present

## 2023-12-20 DIAGNOSIS — S72011D Unspecified intracapsular fracture of right femur, subsequent encounter for closed fracture with routine healing: Secondary | ICD-10-CM | POA: Diagnosis not present

## 2023-12-27 DIAGNOSIS — E049 Nontoxic goiter, unspecified: Secondary | ICD-10-CM | POA: Diagnosis not present

## 2023-12-27 DIAGNOSIS — Z96641 Presence of right artificial hip joint: Secondary | ICD-10-CM | POA: Diagnosis not present

## 2023-12-27 DIAGNOSIS — S72031D Displaced midcervical fracture of right femur, subsequent encounter for closed fracture with routine healing: Secondary | ICD-10-CM | POA: Diagnosis not present

## 2023-12-27 DIAGNOSIS — S72011D Unspecified intracapsular fracture of right femur, subsequent encounter for closed fracture with routine healing: Secondary | ICD-10-CM | POA: Diagnosis not present

## 2023-12-27 DIAGNOSIS — I10 Essential (primary) hypertension: Secondary | ICD-10-CM | POA: Diagnosis not present

## 2024-01-03 ENCOUNTER — Ambulatory Visit: Payer: Medicare Other

## 2024-01-03 VITALS — Ht 66.0 in | Wt 113.0 lb

## 2024-01-03 DIAGNOSIS — Z0000 Encounter for general adult medical examination without abnormal findings: Secondary | ICD-10-CM | POA: Diagnosis not present

## 2024-01-03 NOTE — Patient Instructions (Addendum)
Ms. Rutten , Thank you for taking time to come for your Medicare Wellness Visit. I appreciate your ongoing commitment to your health goals. Please review the following plan we discussed and let me know if I can assist you in the future.   Referrals/Orders/Follow-Ups/Clinician Recommendations: Keep up the good work!!  This is a list of the screening recommended for you and due dates:  Health Maintenance  Topic Date Due   COVID-19 Vaccine (3 - 2024-25 season) 07/23/2023   Medicare Annual Wellness Visit  01/02/2025   DEXA scan (bone density measurement)  04/27/2026   DTaP/Tdap/Td vaccine (2 - Td or Tdap) 04/13/2032   Pneumonia Vaccine  Completed   Flu Shot  Completed   Zoster (Shingles) Vaccine  Completed   HPV Vaccine  Aged Out    Advanced directives: (In Chart) A copy of your advanced directives are scanned into your chart should your provider ever need it.  Next Medicare Annual Wellness Visit scheduled for next year: Yes, 01/08/25 @ 10:10am (phone visit)  Fall Prevention in the Home, Adult Falls can cause injuries and affect people of all ages. There are many simple things that you can do to make your home safe and to help prevent falls. If you need it, ask for help making these changes. What actions can I take to prevent falls? General information Use good lighting in all rooms. Make sure to: Replace any light bulbs that burn out. Turn on lights if it is dark and use night-lights. Keep items that you use often in easy-to-reach places. Lower the shelves around your home if needed. Move furniture so that there are clear paths around it. Do not keep throw rugs or other things on the floor that can make you trip. If any of your floors are uneven, fix them. Add color or contrast paint or tape to clearly mark and help you see: Grab bars or handrails. First and last steps of staircases. Where the edge of each step is. If you use a ladder or stepladder: Make sure that it is fully  opened. Do not climb a closed ladder. Make sure the sides of the ladder are locked in place. Have someone hold the ladder while you use it. Know where your pets are as you move through your home. What can I do in the bathroom?     Keep the floor dry. Clean up any water that is on the floor right away. Remove soap buildup in the bathtub or shower. Buildup makes bathtubs and showers slippery. Use non-skid mats or decals on the floor of the bathtub or shower. Attach bath mats securely with double-sided, non-slip rug tape. If you need to sit down while you are in the shower, use a non-slip stool. Install grab bars by the toilet and in the bathtub and shower. Do not use towel bars as grab bars. What can I do in the bedroom? Make sure that you have a light by your bed that is easy to reach. Do not use any sheets or blankets on your bed that hang to the floor. Have a firm bench or chair with side arms that you can use for support when you get dressed. What can I do in the kitchen? Clean up any spills right away. If you need to reach something above you, use a sturdy step stool that has a grab bar. Keep electrical cables out of the way. Do not use floor polish or wax that makes floors slippery. What can I do with  my stairs? Do not leave anything on the stairs. Make sure that you have a light switch at the top and the bottom of the stairs. Have them installed if you do not have them. Make sure that there are handrails on both sides of the stairs. Fix handrails that are broken or loose. Make sure that handrails are as long as the staircases. Install non-slip stair treads on all stairs in your home if they do not have carpet. Avoid having throw rugs at the top or bottom of stairs, or secure the rugs with carpet tape to prevent them from moving. Choose a carpet design that does not hide the edge of steps on the stairs. Make sure that carpet is firmly attached to the stairs. Fix any carpet that is  loose or worn. What can I do on the outside of my home? Use bright outdoor lighting. Repair the edges of walkways and driveways and fix any cracks. Clear paths of anything that can make you trip, such as tools or rocks. Add color or contrast paint or tape to clearly mark and help you see high doorway thresholds. Trim any bushes or trees on the main path into your home. Check that handrails are securely fastened and in good repair. Both sides of all steps should have handrails. Install guardrails along the edges of any raised decks or porches. Have leaves, snow, and ice cleared regularly. Use sand, salt, or ice melt on walkways during winter months if you live where there is ice and snow. In the garage, clean up any spills right away, including grease or oil spills. What other actions can I take? Review your medicines with your health care provider. Some medicines can make you confused or feel dizzy. This can increase your chance of falling. Wear closed-toe shoes that fit well and support your feet. Wear shoes that have rubber soles and low heels. Use a cane, walker, scooter, or crutches that help you move around if needed. Talk with your provider about other ways that you can decrease your risk of falls. This may include seeing a physical therapist to learn to do exercises to improve movement and strength. Where to find more information Centers for Disease Control and Prevention, STEADI: TonerPromos.no [employer] on Aging: BaseRingTones.pl National Institute on Aging: BaseRingTones.pl Contact a health care provider if: You are afraid of falling at home. You feel weak, drowsy, or dizzy at home. You fall at home. Get help right away if you: Lose consciousness or have trouble moving after a fall. Have a fall that causes a head injury. These symptoms may be an emergency. Get help right away. Call 911. Do not wait to see if the symptoms will go away. Do not drive yourself to the hospital. This  information is not intended to replace advice given to you by your health care provider. Make sure you discuss any questions you have with your health care provider. Document Revised: 07/11/2022 Document Reviewed: 07/11/2022 Elsevier Patient Education  2024 [employer].

## 2024-01-03 NOTE — Progress Notes (Signed)
 Subjective:   Mary Mosley is a 83 y.o. female who presents for Medicare Annual (Subsequent) preventive examination.  This patient declined Interactive audio and Acupuncturist. Therefore the visit was completed with audio only.   Visit Complete: Virtual I connected with  [name] on 01/03/24 by a audio enabled telemedicine application and verified that I am speaking with the correct person using two identifiers.  Patient Location: Home  Provider Location: Home Office  I discussed the limitations of evaluation and management by telemedicine. The patient expressed understanding and agreed to proceed.  Vital Signs: Because this visit was a virtual/telehealth visit, some criteria may be missing or patient reported. Any vitals not documented were not able to be obtained and vitals that have been documented are patient reported.  Patient Medicare AWV questionnaire was completed by the patient on 12/30/23; I have confirmed that all information answered by patient is correct and no changes since this date.  Cardiac Risk Factors include: advanced age (>23men, >36 women);hypertension;Other (see comment), Risk factor comments: history of OSA     Objective:    Today's Vitals   01/03/24 1008  Weight: 113 lb (51.3 kg)  Height: 5\' 6"  (1.676 m)   Body mass index is 18.24 kg/m.     01/03/2024   10:22 AM 11/09/2023   12:45 PM 11/08/2023    5:25 PM 01/02/2023    9:25 AM 08/10/2022    2:13 PM 06/30/2022   10:44 AM 04/13/2022    5:31 PM  Advanced Directives  Does Patient Have a Medical Advance Directive? Yes Yes Yes Yes Yes No No  Type of Estate agent of [place];Living will  Healthcare Power of [place];Living will Living will;Healthcare Power of [employer] Power of [place];Living will    Does patient want to make changes to medical advance directive? No - Patient declined No - Patient declined No - Patient declined      Copy of Healthcare Power  of Attorney in Chart? Yes - validated most recent copy scanned in chart (See row information)  No - copy requested      Would patient like information on creating a medical advance directive?      No - Patient declined No - Patient declined    Current Medications (verified) Outpatient Encounter Medications as of 01/03/2024  Medication Sig   aspirin EC (ASPIRIN 81) 81 MG tablet Take 81 mg by mouth 2 (two) times daily.   bisacodyl (DULCOLAX) 5 MG EC tablet Take 5 mg by mouth daily as needed for moderate constipation.   cholecalciferol (VITAMIN D) 1000 UNITS tablet Take 1 tablet by mouth daily.   diphenhydrAMINE (BENADRYL) 25 mg capsule Take 25 mg by mouth daily.   Multiple Vitamins-Minerals (CENTRUM SILVER PO) Take 1 tablet by mouth daily.   naproxen sodium (ALEVE) 220 MG tablet Take 220 mg by mouth daily as needed. Back and muscle pain 24 hr tablet   traZODone (DESYREL) 50 MG tablet Take 1-2 tablets (50-100 mg total) by mouth at bedtime.   risedronate (ACTONEL) 35 MG tablet Take 1 tablet (35 mg total) by mouth every 7 (seven) days. with water on empty stomach, nothing by mouth or lie down for next 30 minutes. (Patient not taking: Reported on 01/03/2024)   No facility-administered encounter medications on file as of 01/03/2024.    Allergies (verified) Amlodipine, Diltiazem hcl, Fosamax [alendronate sodium], Gabapentin, Hydrochlorothiazide, Penicillins, and Actonel [risedronate]   History: Past Medical History:  Diagnosis Date   Hypertension  OSA (obstructive sleep apnea)    pt denies   Osteopenia    Past Surgical History:  Procedure Laterality Date   CATARACT EXTRACTION W/PHACO Right 06/07/2021   Procedure: CATARACT EXTRACTION PHACO AND INTRAOCULAR LENS PLACEMENT (IOC) RIGHT;  Surgeon: Nevada Crane, MD;  Location: [facility];  Service: Ophthalmology;  Laterality: Right;  8.45 00:50.7   CATARACT EXTRACTION W/PHACO Left 06/21/2021   Procedure: CATARACT EXTRACTION PHACO AND  INTRAOCULAR LENS PLACEMENT (IOC) LEFT 7.54 00:51.0;  Surgeon: Nevada Crane, MD;  Location: [facility];  Service: Ophthalmology;  Laterality: Left;   EYE SURGERY Bilateral    due to trauma   HIP ARTHROPLASTY Right 11/09/2023   Procedure: ARTHROPLASTY BIPOLAR HIP (HEMIARTHROPLASTY);  Surgeon: Oliver Barre, MD;  Location: [facility];  Service: Orthopedics;  Laterality: Right;   TONSILLECTOMY AND ADENOIDECTOMY     Family History  Problem Relation Age of Onset   Heart attack Mother    Diabetes Father    Other Father 38       auto accident   Lung cancer Brother    Heart Problems Brother    Heart Problems Brother        Has stents   Liver cancer Son    Breast cancer Neg Hx    Social History   Socioeconomic History   Marital status: Widowed    Spouse name: Not on file   Number of children: 2   Years of education: Not on file   Highest education level: Some college, no degree  Occupational History   Occupation: Retired  Tobacco Use   Smoking status: Never   Smokeless tobacco: Never  Vaping Use   Vaping status: Never Used  Substance and Sexual Activity   Alcohol use: No    Alcohol/week: 0.0 standard drinks of alcohol   Drug use: No   Sexual activity: Not on file  Other Topics Concern   Not on file  Social History Narrative   1 living son, 1 adopted son deceased   Social Drivers of Corporate investment banker Strain: Low Risk  (01/03/2024)   Overall Financial Resource Strain (CARDIA)    Difficulty of Paying Living Expenses: Not very hard  Food Insecurity: No Food Insecurity (01/03/2024)   Hunger Vital Sign    Worried About Running Out of Food in the Last Year: Never true    Ran Out of Food in the Last Year: Never true  Transportation Needs: No Transportation Needs (01/03/2024)   PRAPARE - Administrator, Civil Service (Medical): No    Lack of Transportation (Non-Medical): No  Physical Activity: Sufficiently Active (01/03/2024)   Exercise Vital  Sign    Days of Exercise per Week: 6 days    Minutes of Exercise per Session: 30 min  Stress: No Stress Concern Present (01/03/2024)   [employer] of Occupational Health - Occupational Stress Questionnaire    Feeling of Stress : Only a little  Social Connections: Moderately Integrated (01/03/2024)   Social Connection and Isolation Panel [NHANES]    Frequency of Communication with Friends and Family: More than three times a week    Frequency of Social Gatherings with Friends and Family: More than three times a week    Attends Religious Services: More than 4 times per year    Active Member of [employer] or Organizations: Yes    Attends Banker Meetings: More than 4 times per year    Marital Status: Widowed    Tobacco  Counseling Counseling given: Not Answered   Clinical Intake:  Pre-visit preparation completed: Yes  Pain : No/denies pain     BMI - recorded: 18.24 Nutritional Status: BMI <19  Underweight Nutritional Risks: None Diabetes: No  How often do you need to have someone help you when you read instructions, pamphlets, or other written materials from your doctor or pharmacy?: 1 - Never  Interpreter Needed?: No  Information entered by :: Tora Kindred, CMA   Activities of Daily Living    01/03/2024   10:09 AM 12/30/2023    4:22 PM  In your present state of health, do you have any difficulty performing the following activities:  Hearing? 0 0  Vision? 0 0  Difficulty concentrating or making decisions? 0 0  Walking or climbing stairs? 0 0  Dressing or bathing? 0 0  Doing errands, shopping? 0 0  Preparing Food and eating ? N N  Using the Toilet? N N  In the past six months, have you accidently leaked urine? N N  Do you have problems with loss of bowel control? N N  Managing your Medications? N N  Managing your Finances? N N  Housekeeping or managing your Housekeeping? N N    Patient Care Team: Malva Limes, MD as PCP - General (Family  Medicine) Nevada Crane, MD as Consulting Physician (Ophthalmology)  Indicate any recent Medical Services you may have received from other than [facility] providers in the past year (date may be approximate).     Assessment:   This is a routine wellness examination for Emmalee.  Hearing/Vision screen Hearing Screening - Comments:: Denies hearing loss Vision Screening - Comments:: Gets routine eye exam, Dr. Willey Blade @ [facility]   Goals Addressed             This Visit's Progress    Patient Stated       Maintain current health and activity      Depression Screen    01/03/2024   10:20 AM 11/24/2023    3:17 PM 02/06/2023   10:08 AM 01/02/2023    9:23 AM 09/13/2022    9:12 AM 08/10/2022    2:13 PM 06/30/2022   10:44 AM  PHQ 2/9 Scores  PHQ - 2 Score 0 0 0 0 0 0 0  PHQ- 9 Score 0 3 1  0      Fall Risk    01/03/2024   10:23 AM 12/30/2023    4:22 PM 11/24/2023    3:17 PM 02/06/2023   10:09 AM 12/31/2022   12:05 PM  Fall Risk   Falls in the past year? 1 1 1  0 0  Number falls in past yr: 0 0 0 0 0  Injury with Fall? 1 1 1  0 1  Risk for fall due to : History of fall(s);Impaired balance/gait;Orthopedic patient   No Fall Risks   Follow up Falls evaluation completed;Education provided;Falls prevention discussed   Falls evaluation completed     MEDICARE RISK AT HOME: Medicare Risk at Home Any stairs in or around the home?: Yes If so, are there any without handrails?: No Home free of loose throw rugs in walkways, pet beds, electrical cords, etc?: Yes Adequate lighting in your home to reduce risk of falls?: Yes Life alert?: No Use of a cane, walker or w/c?: No Grab bars in the bathroom?: Yes Shower chair or bench in shower?: Yes Elevated toilet seat or a handicapped toilet?: Yes  TIMED UP AND GO:  Was  the test performed?  No    Cognitive Function:    09/11/2020    3:35 PM  MMSE - Mini Mental State Exam  Orientation to time 5  Orientation to Place 5  Registration 3   Attention/ Calculation 5  Recall 3  Language- name 2 objects 2  Language- repeat 1  Language- follow 3 step command 3  Language- read & follow direction 1  Write a sentence 1  Copy design 1  Total score 30        01/03/2024   10:24 AM 01/02/2023    9:26 AM 07/30/2019    9:25 AM 07/21/2017    1:11 PM  6CIT Screen  What Year? 0 points 0 points 0 points 0 points  What month? 0 points 0 points 0 points 0 points  What time? 0 points 0 points 0 points 0 points  Count back from 20 0 points 0 points 0 points 0 points  Months in reverse 2 points 0 points 0 points 0 points  Repeat phrase 0 points 0 points 0 points 4 points  Total Score 2 points 0 points 0 points 4 points    Immunizations Immunization History  Administered Date(s) Administered   Fluad Quad(high Dose 65+) 09/11/2020, 08/20/2021, 09/13/2022   Influenza, High Dose Seasonal PF 09/07/2015, 09/15/2016, 07/21/2017, 07/24/2018, 09/16/2019   PFIZER(Purple Top)SARS-COV-2 Vaccination 09/22/2020, 10/13/2020   Pneumococcal Conjugate-13 09/04/2014   Pneumococcal Polysaccharide-23 01/08/2004   Tdap 04/13/2022   Zoster Recombinant(Shingrix) 03/14/2022, 06/17/2022    TDAP status: Up to date  Flu Vaccine status: Up to date  Pneumococcal vaccine status: Up to date  Covid-19 vaccine status: Declined, Education has been provided regarding the importance of this vaccine but patient still declined. Advised may receive this vaccine at local pharmacy or Health Dept.or vaccine clinic. Aware to provide a copy of the vaccination record if obtained from local pharmacy or Health Dept. Verbalized acceptance and understanding.  Qualifies for Shingles Vaccine? Yes   Zostavax completed No   Shingrix Completed?: Yes  Screening Tests Health Maintenance  Topic Date Due   INFLUENZA VACCINE  06/22/2023   COVID-19 Vaccine (3 - 2024-25 season) 07/23/2023   Medicare Annual Wellness (AWV)  01/02/2025   DEXA SCAN  04/27/2026   DTaP/Tdap/Td (2 - Td  or Tdap) 04/13/2032   Pneumonia Vaccine 21+ Years old  Completed   Zoster Vaccines- Shingrix  Completed   HPV VACCINES  Aged Out    Health Maintenance  Health Maintenance Due  Topic Date Due   INFLUENZA VACCINE  06/22/2023   COVID-19 Vaccine (3 - 2024-25 season) 07/23/2023    Colorectal cancer screening: No longer required.   Mammogram status: No longer required due to age.  Bone Density status: Completed 04/28/23. Results reflect: Bone density results: OSTEOPENIA. Repeat every 3 years.  Lung Cancer Screening: (Low Dose CT Chest recommended if Age 37-80 years, 20 pack-year currently smoking OR have quit w/in 15years.) does not qualify.   Lung Cancer Screening Referral: n/a  Additional Screening:  Hepatitis C Screening: does not qualify;  Vision Screening: Recommended annual ophthalmology exams for early detection of glaucoma and other disorders of the eye.  Dental Screening: Recommended annual dental exams for proper oral hygiene    Community Resource Referral / Chronic Care Management: CRR required this visit?  No   CCM required this visit?  No     Plan:     I have personally reviewed and noted the following in the patient's chart:   Medical and  social history Use of alcohol, tobacco or illicit drugs  Current medications and supplements including opioid prescriptions. Patient is not currently taking opioid prescriptions. Functional ability and status Nutritional status Physical activity Advanced directives List of other physicians Hospitalizations, surgeries, and ER visits in previous 12 months Vitals Screenings to include cognitive, depression, and falls Referrals and appointments  In addition, I have reviewed and discussed with patient certain preventive protocols, quality metrics, and best practice recommendations. A written personalized care plan for preventive services as well as general preventive health recommendations were provided to patient.      Tora Kindred, CMA   01/03/2024   After Visit Summary: (MyChart) Due to this being a telephonic visit, the after visit summary with patients personalized plan was offered to patient via MyChart   Nurse Notes:  6 CIT Score - 2 Declined covid vaccine

## 2024-01-04 ENCOUNTER — Ambulatory Visit (INDEPENDENT_AMBULATORY_CARE_PROVIDER_SITE_OTHER): Payer: Medicare Other | Admitting: Orthopedic Surgery

## 2024-01-04 ENCOUNTER — Encounter: Payer: Self-pay | Admitting: Orthopedic Surgery

## 2024-01-04 DIAGNOSIS — S72001A Fracture of unspecified part of neck of right femur, initial encounter for closed fracture: Secondary | ICD-10-CM

## 2024-01-04 NOTE — Progress Notes (Signed)
Orthopaedic Postop Note  Assessment: Mary Mosley is a 83 y.o. female s/p Right hip hemiarthroplasty  DOS: 11/09/2023  Plan: Mrs. Mary Mosley is doing very well following surgery.  She is walking without assistive device.  No pain.  Continue to walk to improve strength and help with healing.  Very pleased with her progress.  All questions have been answered.  I would like to see her in clinic one more time unless she has some issues.  Follow up in 6 weeks; call with any issues  Follow-up: Return in about 6 weeks (around 02/15/2024). XR at next visit: AP pelvis and Right hip  Subjective:  Chief Complaint  Patient presents with   Follow-up    Postop, DOS 11/09/23, R Hip Hemiarthroplasty, and doing ok, no problems.  Ambulating unassisted.     History of Present Illness: Mary Mosley is a 83 y.o. female who presents following the above stated procedure.  She continues to do very well.  She is no longer using an assistive device.  She occasionally takes tylenol, but typically this is for a headache, not pain in her hip.  She is able to sleep on her right side.  No issues with her incision.   Review of Systems: No fevers or chills No numbness or tingling No Chest Pain No shortness of breath   Objective: There were no vitals taken for this visit.  Physical Exam:  Alert and oriented.  No acute distress.  Steady gait without assistive device.  Right hip lateral based incision has healed.  No surrounding erythema or drainage.  No tenderness to palpation.  She tolerates gentle range of motion of the right hip.  She is able to maintain a straight leg raise.  Sensation is intact distally.  Active motion intact to the EHL/TA.  IMAGING: I personally ordered and reviewed the following images:  No new imaging was obtained today  Oliver Barre, MD 01/04/2024 10:16 AM

## 2024-01-31 ENCOUNTER — Other Ambulatory Visit: Payer: Self-pay | Admitting: Family Medicine

## 2024-01-31 DIAGNOSIS — G4700 Insomnia, unspecified: Secondary | ICD-10-CM

## 2024-01-31 MED ORDER — TRAZODONE HCL 50 MG PO TABS
50.0000 mg | ORAL_TABLET | Freq: Every day | ORAL | 4 refills | Status: AC
Start: 2024-01-31 — End: ?

## 2024-01-31 NOTE — Telephone Encounter (Signed)
 Copied from CRM [id number]. Topic: Clinical - Medication Refill >> Jan 31, 2024 11:53 AM Fuller Mandril wrote: Most Recent Primary Care Visit:  Provider: Tora Kindred  Department: [facility]  Visit Type: MEDICARE AWV, SEQUENTIAL  Date: 01/03/2024  Medication: traZODone (DESYREL) 50 MG tablet  Has the patient contacted their pharmacy? Yes (Agent: If no, request that the patient contact the pharmacy for the refill. If patient does not wish to contact the pharmacy document the reason why and proceed with request.) (Agent: If yes, when and what did the pharmacy advise?) No refills   Is this the correct pharmacy for this prescription? Yes If no, delete pharmacy and type the correct one.  This is the patient's preferred pharmacy:  [facility] [address] [place] - [address] [place] [employer] [place] [address] Phone: [phone] Fax: [phone]   Has the prescription been filled recently? No  Is the patient out of the medication? No - 4 left   Has the patient been seen for an appointment in the last year OR does the patient have an upcoming appointment? Yes  Can we respond through MyChart? Yes  Agent: Please be advised that Rx refills may take up to 3 business days. We ask that you follow-up with your pharmacy.

## 2024-02-01 ENCOUNTER — Ambulatory Visit: Payer: Medicare Other | Admitting: Orthopedic Surgery

## 2024-02-13 ENCOUNTER — Other Ambulatory Visit: Payer: Self-pay

## 2024-02-13 ENCOUNTER — Ambulatory Visit
Admission: RE | Admit: 2024-02-13 | Discharge: 2024-02-13 | Disposition: A | Discharge status: Home / Self Care | Source: Ambulatory Visit | Attending: Physician Assistant | Admitting: Physician Assistant

## 2024-02-13 DIAGNOSIS — M25551 Pain in right hip: Secondary | ICD-10-CM

## 2024-02-13 DIAGNOSIS — Z96641 Presence of right artificial hip joint: Secondary | ICD-10-CM | POA: Diagnosis not present

## 2024-02-13 DIAGNOSIS — Z471 Aftercare following joint replacement surgery: Secondary | ICD-10-CM | POA: Diagnosis not present

## 2024-02-14 ENCOUNTER — Ambulatory Visit: Admitting: Orthopedic Surgery

## 2024-02-14 ENCOUNTER — Encounter: Payer: Self-pay | Admitting: Orthopedic Surgery

## 2024-02-14 VITALS — BP 189/70 | HR 65 | Ht 66.0 in | Wt 120.0 lb

## 2024-02-14 DIAGNOSIS — S72001A Fracture of unspecified part of neck of right femur, initial encounter for closed fracture: Secondary | ICD-10-CM

## 2024-02-14 NOTE — Progress Notes (Signed)
 Orthopaedic Postop Note  Assessment: Mary Mosley is a 83 y.o. female s/p Right hip hemiarthroplasty  DOS: 11/09/2023  Plan: Mary Mosley has done great.  She is not having any pain.  She is ambulating without an assistive device.  Radiographs look good.  Nothing further is needed at this time.  She will contact the clinic with any issues.  Otherwise, follow up as needed.   Follow-up: Return if symptoms worsen or fail to improve. XR at next visit: AP pelvis and Right hip  Subjective:  Chief Complaint  Patient presents with   Hip Pain    Patient fell on 11/08/23 she is here today for a follow up she said she is doing okay, no pain, walking okay      History of Present Illness: Mary Mosley is a 83 y.o. female who presents following the above stated procedure.  Surgery was about 3 months ago.  She denies pain.  She is not taking medicines.  She is starting to do some work in her yard.  She has started driving.  No issues with her incision.   Review of Systems: No fevers or chills No numbness or tingling No Chest Pain No shortness of breath   Objective: BP (!) 189/70   Pulse 65   Ht 5\' 6"  (1.676 m)   Wt 120 lb (54.4 kg)   BMI 19.37 kg/m   Physical Exam:  Alert and oriented.  No acute distress.  Steady gait without assistive device.  Surgical incision is healed.  No surrounding erythema or drainage.  No tenderness to palpation.  She is able to maintain a straight leg raise.  She has good right leg strength.  Toes warm and well-perfused.  Active motion intact in EHL/TA.  IMAGING: I personally ordered and reviewed the following images:  AP pelvis and right hip x-rays were reviewed in clinic.  Prosthesis remains intact.  No evidence of subsidence.  No fractures.  No lucency around the implants.  Oliver Barre, MD 02/14/2024 2:06 PM

## 2024-02-15 ENCOUNTER — Ambulatory Visit: Payer: Medicare Other | Admitting: Orthopedic Surgery

## 2024-02-23 ENCOUNTER — Ambulatory Visit (INDEPENDENT_AMBULATORY_CARE_PROVIDER_SITE_OTHER): Payer: Self-pay | Admitting: Family Medicine

## 2024-02-23 ENCOUNTER — Encounter: Payer: Self-pay | Admitting: Family Medicine

## 2024-02-23 VITALS — BP 159/71 | HR 74 | Ht 66.0 in | Wt 118.4 lb

## 2024-02-23 DIAGNOSIS — M816 Localized osteoporosis [Lequesne]: Secondary | ICD-10-CM | POA: Diagnosis not present

## 2024-02-23 DIAGNOSIS — I10 Essential (primary) hypertension: Secondary | ICD-10-CM

## 2024-03-08 NOTE — Progress Notes (Signed)
 Established patient visit   Patient: Mary Mosley   DOB: 1941-04-10   83 y.o. Female  MRN: [id number] Visit Date: 02/23/2024  Today's healthcare provider: Jeralene Mom, MD   Chief Complaint  Patient presents with   Follow-up   Hypertension    Pt took bp at home at 2pm and it read 134/67 pulse 76, Normally around the same reading daily, no concerns at this time Pt not taking any bp medication currently   Subjective    Discussed the use of AI scribe software for clinical note transcription with the patient, who gave verbal consent to proceed.  History of Present Illness   Mary Mosley is a 83 year old female with osteoporosis who presents for follow-up on her hip fracture and medication review.  Her hip is doing well following the replacement of the ball in her right hip due to a fracture. She previously attempted to take Actonel  for bone strengthening but experienced severe itching after the second dose, leading to discontinuation. She has not taken any other osteoporosis medications since then.  She monitors her blood pressure at home, which usually reads in the 130s, and a recent reading by a home health nurse was 118. Her blood pressure tends to be higher during office visits. No trouble with breathing, shortness of breath, or chest pains.  She takes vitamin D  daily and uses trazodone  as needed for sleep, noting that she is sleeping much better recently.       Medications: Outpatient Medications Prior to Visit  Medication Sig   bisacodyl  (DULCOLAX) 5 MG EC tablet Take 5 mg by mouth daily as needed for moderate constipation.   cholecalciferol (VITAMIN D ) 1000 UNITS tablet Take 1 tablet by mouth daily.   diphenhydrAMINE  (BENADRYL ) 25 mg capsule Take 25 mg by mouth daily as needed.   Multiple Vitamins-Minerals (CENTRUM SILVER PO) Take 1 tablet by mouth daily.   aspirin  EC (ASPIRIN  81) 81 MG tablet Take 81 mg by mouth 2 (two) times daily. (Patient not taking: Reported  on 02/23/2024)   naproxen sodium (ALEVE) 220 MG tablet Take 220 mg by mouth daily as needed. Back and muscle pain 24 hr tablet   risedronate  (ACTONEL ) 35 MG tablet Take 1 tablet (35 mg total) by mouth every 7 (seven) days. with water on empty stomach, nothing by mouth or lie down for next 30 minutes. (Patient not taking: Reported on 02/23/2024)   traZODone  (DESYREL ) 50 MG tablet Take 1-2 tablets (50-100 mg total) by mouth at bedtime. (Patient taking differently: Take 50-100 mg by mouth daily as needed.)   No facility-administered medications prior to visit.   Review of Systems     Objective    BP (!) 159/71   Pulse 74   Ht 5\' 6"  (1.676 m)   Wt 118 lb 6.4 oz (53.7 kg)   SpO2 100%   BMI 19.11 kg/m   Physical Exam   General: Appearance:    Thin female in no acute distress  Eyes:    PERRL, conjunctiva/corneas clear, EOM's intact       Lungs:     Clear to auscultation bilaterally, respirations unlabored  Heart:    Normal heart rate. Normal rhythm. No murmurs, rubs, or gallops.    MS:   All extremities are intact.    Neurologic:   Awake, alert, oriented x 3. No apparent focal neurological defect.         Assessment & Plan  Osteoporosis Osteoporosis with history of right hip fracture. Actonel  discontinued due to pruritus. Prolia discussed as alternative, effective in preventing fractures. - Will need to investigate local options for Prolia Injection  Hypertension Blood pressure readings typically in 130s/67 mmHg, recent 118 mmHg. No current concern.  General Health Maintenance Takes daily vitamin D . No need for blood work or screenings. - Continue daily vitamin D  supplementation.    No follow-ups on file.      Jeralene Mom, MD  [facility] [phone] (phone) [phone] (fax)  [facility]

## 2024-05-03 ENCOUNTER — Ambulatory Visit (INDEPENDENT_AMBULATORY_CARE_PROVIDER_SITE_OTHER): Admitting: Family Medicine

## 2024-05-03 ENCOUNTER — Encounter: Payer: Self-pay | Admitting: Family Medicine

## 2024-05-03 DIAGNOSIS — J321 Chronic frontal sinusitis: Secondary | ICD-10-CM

## 2024-05-03 MED ORDER — CEFDINIR 300 MG PO CAPS: 600.0000 mg | ORAL_CAPSULE | Freq: Every day | ORAL | 0 refills | Status: AC

## 2024-05-03 NOTE — Patient Instructions (Signed)
 Marland Kitchen  Please review the attached list of medications and notify my office if there are any errors.   . Please bring all of your medications to every appointment so we can make sure that our medication list is the same as yours.

## 2024-05-03 NOTE — Progress Notes (Signed)
 Established patient visit   Patient: Mary Mosley   DOB: [birth year]   83 y.o. Female  MRN: [id number] Visit Date: 05/03/2024  Today's healthcare provider: Jeralene Mom, MD   Chief Complaint  Patient presents with   Sinus Problem    X 2 weeks    Subjective    Discussed the use of AI scribe software for clinical note transcription with the patient, who gave verbal consent to proceed.  History of Present Illness   Mary Mosley is an 83 year old female who presents with sinus congestion and pressure.  She has been experiencing sinus congestion and pressure for over two weeks, characterized by a sensation of 'compression' around her eyes. The symptoms began during a period of rainy weather and have persisted since then. She notes minimal nasal discharge.  She has been using a nasal spray once daily, which provides some relief. Additionally, she uses hot and cold towels to alleviate the pressure, but the symptoms have not resolved completely.  No fever, chills, or sweats are present. Occasionally, she experiences a need to cough, producing a small amount of sputum, but this is infrequent. She also reports occasional throat soreness.  She is allergic to penicillin, which causes hives.       Medications: Outpatient Medications Prior to Visit  Medication Sig   bisacodyl  (DULCOLAX) 5 MG EC tablet Take 5 mg by mouth daily as needed for moderate constipation.   cholecalciferol (VITAMIN D ) 1000 UNITS tablet Take 1 tablet by mouth daily.   diphenhydrAMINE  (BENADRYL ) 25 mg capsule Take 25 mg by mouth daily as needed.   Multiple Vitamins-Minerals (CENTRUM SILVER PO) Take 1 tablet by mouth daily.   naproxen sodium (ALEVE) 220 MG tablet Take 220 mg by mouth daily as needed. Back and muscle pain 24 hr tablet   traZODone  (DESYREL ) 50 MG tablet Take 1-2 tablets (50-100 mg total) by mouth at bedtime. (Patient taking differently: Take 50-100 mg by mouth daily as needed.)   risedronate   (ACTONEL ) 35 MG tablet Take 1 tablet (35 mg total) by mouth every 7 (seven) days. with water on empty stomach, nothing by mouth or lie down for next 30 minutes. (Patient not taking: Reported on 05/03/2024)   [DISCONTINUED] aspirin  EC (ASPIRIN  81) 81 MG tablet Take 81 mg by mouth 2 (two) times daily. (Patient not taking: Reported on 02/23/2024)   No facility-administered medications prior to visit.   Review of Systems  Constitutional:  Negative for appetite change, chills, fatigue and fever.  Respiratory:  Negative for chest tightness and shortness of breath.   Cardiovascular:  Negative for chest pain and palpitations.  Gastrointestinal:  Negative for abdominal pain, nausea and vomiting.  Neurological:  Negative for dizziness and weakness.       Objective    BP (!) 159/71 (BP Location: Left Arm, Patient Position: Sitting, Cuff Size: Normal)   Pulse 78   Temp 97.7 F (36.5 C) (Oral)   Resp 16   Ht 5' 7 (1.702 m)   Wt 118 lb (53.5 kg)   SpO2 100%   BMI 18.48 kg/m   Physical Exam   General Appearance:    Thin female, alert, cooperative, in no acute distress  HENT:   bilateral TM normal without fluid or infection, neck without nodes, throat normal without erythema or exudate, bilateral frontal sinus tender, and nasal mucosa congested  Eyes:    PERRL, conjunctiva/corneas clear, EOM's intact       Lungs:  Clear to auscultation bilaterally, respirations unlabored  Heart:    Normal heart rate. Normal rhythm. No murmurs, rubs, or gallops.    Neurologic:   Awake, alert, oriented x 3. No apparent focal neurological           defect.           Assessment & Plan        Sinusitis Sinusitis with persistent symptoms over two weeks, possibly related to barometric pressure changes. Allergic to penicillin. - Prescribed Cefdinir 600mg  once daily, noted potential balance issues. - Continued nasal spray for congestion and inflammation.    No follow-ups on file.     Jeralene Mom, MD  [facility] [phone] (phone) [phone] (fax)  [facility]

## 2024-05-23 ENCOUNTER — Encounter: Payer: Self-pay | Admitting: Student in an Organized Health Care Education/Training Program

## 2024-05-23 ENCOUNTER — Ambulatory Visit
Attending: Student in an Organized Health Care Education/Training Program | Admitting: Student in an Organized Health Care Education/Training Program

## 2024-05-23 VITALS — BP 177/79 | HR 74 | Temp 97.3°F | Resp 16 | Ht 66.0 in | Wt 114.0 lb

## 2024-05-23 DIAGNOSIS — G894 Chronic pain syndrome: Secondary | ICD-10-CM | POA: Diagnosis not present

## 2024-05-23 DIAGNOSIS — M5134 Other intervertebral disc degeneration, thoracic region: Secondary | ICD-10-CM | POA: Diagnosis not present

## 2024-05-23 DIAGNOSIS — M47894 Other spondylosis, thoracic region: Secondary | ICD-10-CM | POA: Insufficient documentation

## 2024-05-23 NOTE — Progress Notes (Signed)
 PROVIDER NOTE: Information contained herein reflects review and annotations entered in association with encounter. Interpretation of such information and data should be left to medically-trained personnel. Information provided to patient can be located elsewhere in the medical record under Patient Instructions. Document created using STT-dictation technology, any transcriptional errors that may result from process are unintentional.    Patient: Mary Mosley  Service Category: E/M  Provider: Wallie Sherry, MD  DOB: Oct 07, 1941  DOS: 05/23/2024  Referring Provider: Gasper Nancyann BRAVO, MD  MRN: [id number]  Specialty: Interventional Pain Management  PCP: Gasper Nancyann BRAVO, MD  Type: Established Patient  Setting: Ambulatory outpatient    Location: Office  Delivery: Face-to-face     HPI  Mary Mosley, a 83 y.o. 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 Mary Mosley primary complain today is Back Pain (Thoracic midline) Last encounter: My last encounter with her was on 10/05/22  Pain Assessment: Severity of Chronic pain is reported as a 7 /10. Location: Back Left, Right, Mid/denies. Onset: More than a month ago. Quality: Constant, Tender, Sore, Discomfort. Timing: Constant. Modifying factor(s): cushion for support. Vitals:  height is 5' 6 (1.676 m) and weight is 114 lb (51.7 kg). Her temporal temperature is 97.3 F (36.3 C) (abnormal). Her blood pressure is 177/79 (abnormal) and her pulse is 74. Her respiration is 16 and oxygen saturation is 100%.   Reason for encounter:   Patient was last seen by me 10/05/2022.  She presents today with increased midthoracic pain overlying her T10-L1 region.  It is worse with lateral rotation and extension.  Of note she saw me back in 2023 where she received diagnostic and therapeutic lumbar facet medial branch nerve blocks bilaterally at L3, L4 and L5 which are still providing her with pain relief.  She we subsequently  addressed her midthoracic pain related to thoracic facet arthropathy where she received thoracic medial branch nerve blocks that provided her with 80% pain relief for over 1 year.  She states that over the last 3 to 4 months her mid back pain has worsened.  It is tender to palpation.  We discussed repeating thoracic medial branch nerve blocks as below.  ROS  Constitutional: Denies any fever or chills Gastrointestinal: No reported hemesis, hematochezia, vomiting, or acute GI distress Musculoskeletal: Midthoracic pain Neurological: No reported episodes of acute onset apraxia, aphasia, dysarthria, agnosia, amnesia, paralysis, loss of coordination, or loss of consciousness  Medication Review  Multiple Vitamins-Minerals, bisacodyl , cholecalciferol, diphenhydrAMINE , naproxen sodium, risedronate , and traZODone   History Review  Allergy: Mary Mosley is allergic to amlodipine , diltiazem  hcl, fosamax  [alendronate  sodium], gabapentin , hydrochlorothiazide , penicillins, and actonel  [risedronate ]. Drug: Mary Mosley  reports no history of drug use. Alcohol:  reports no history of alcohol use. Tobacco:  reports that she has never smoked. She has never used smokeless tobacco. Social: [name]  reports that she has never smoked. She has never used smokeless tobacco. She reports that she does not drink alcohol and does not use drugs. Medical:  has a past medical history of Hypertension, OSA (obstructive sleep apnea), and Osteopenia. Surgical: Mary Mosley  has a past surgical history that includes Eye surgery (Bilateral); Tonsillectomy and adenoidectomy; Cataract extraction w/PHACO (Right, 06/07/2021); Cataract extraction w/PHACO (Left, 06/21/2021); and Hip Arthroplasty (Right, 11/09/2023). Family: family history includes Diabetes in her father; Heart Problems in her brother and brother; Heart attack in her mother; Liver cancer in her son; Lung cancer in her brother; Other (age of onset: 40)  in her father.  Laboratory  Chemistry Profile   Renal Lab Results  Component Value Date   BUN 18 11/10/2023   CREATININE 0.84 11/10/2023   BCR 15 09/13/2022   GFRAA 74 09/11/2020   GFRNONAA >60 11/10/2023    Hepatic Lab Results  Component Value Date   AST 29 11/09/2023   ALT 21 11/09/2023   ALBUMIN  4.7 11/09/2023   ALKPHOS 56 11/09/2023    Electrolytes Lab Results  Component Value Date   NA 136 11/10/2023   K 4.2 11/10/2023   CL 104 11/10/2023   CALCIUM 8.8 (L) 11/10/2023    Bone Lab Results  Component Value Date   VD25OH 56.4 09/17/2018    Inflammation (CRP: Acute Phase) (ESR: Chronic Phase) No results found for: CRP, ESRSEDRATE, LATICACIDVEN       Note: Above Lab results reviewed.   CLINICAL DATA:  Thoracic pain.   EXAM: THORACIC SPINE 2 VIEWS   COMPARISON:  None Available.   FINDINGS: There is no evidence of thoracic spine fracture. Scoliosis of spine is noted. Degenerative joint changes of the lower thoracic upper lumbar spine noted. There is chronic compression deformity of a upper lumbar vertebral body.   IMPRESSION: No acute fracture or dislocation. Degenerative joint changes of spine. 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    Physical Exam  General appearance: Well nourished, well developed, and well hydrated. In no apparent acute distress Mental status: Alert, oriented x 3 (person, place, & time)       Respiratory: No evidence of acute respiratory distress Eyes: PERLA Vitals: BP (!) 177/79 (BP Location: Left Arm, Patient Position: Sitting, Cuff Size: Normal)   Pulse 74   Temp (!) 97.3 F (36.3 C) (Temporal)   Resp 16   Ht 5' 6 (1.676 m)   Wt 114 lb (51.7 kg)   SpO2 100%   BMI 18.40 kg/m  BMI: Estimated body mass index is 18.4 kg/m as calculated from the following:   Height as of this encounter: 5' 6 (1.676 m).   Weight as of this encounter: 114 lb (51.7 kg). Ideal: Ideal body weight: 59.3 kg (130 lb 11.7 oz)  Thoracic Spine  Area Exam  Skin & Axial Inspection: No masses, redness, or swelling Alignment: Symmetrical Functional ROM: Unrestricted ROM Stability: No instability detected Muscle Tone/Strength: Functionally intact. No obvious neuro-muscular anomalies detected. Sensory (Neurological): Musculoskeletal pain pattern, facet mediated Muscle strength & Tone: No palpable anomalies Lumbar Spine Area Exam  Skin & Axial Inspection: No masses, redness, or swelling Alignment: Symmetrical Functional ROM: Unrestricted ROM       Stability: No instability detected Muscle Tone/Strength: Functionally intact. No obvious neuro-muscular anomalies detected. Sensory (Neurological): Improved Palpation: No palpable anomalies       Provocative Tests: Hyperextension/rotation test: Improved after treatment       Lumbar quadrant test (Kemp's test): Improved after treatment        Gait & Posture Assessment  Ambulation: Unassisted Gait: Relatively normal for age and body habitus Posture: WNL  Lower Extremity Exam    Side: Right lower extremity  Side: Left lower extremity  Stability: No instability observed          Stability: No instability observed          Skin & Extremity Inspection: Skin color, temperature, and hair growth are WNL. No peripheral edema or cyanosis. No masses, redness, swelling, asymmetry, or associated skin lesions. No contractures.  Skin & Extremity Inspection: Skin color, temperature, and hair growth are  WNL. No peripheral edema or cyanosis. No masses, redness, swelling, asymmetry, or associated skin lesions. No contractures.  Functional ROM: Unrestricted ROM                  Functional ROM: Unrestricted ROM                  Muscle Tone/Strength: Functionally intact. No obvious neuro-muscular anomalies detected.  Muscle Tone/Strength: Functionally intact. No obvious neuro-muscular anomalies detected.  Sensory (Neurological): Unimpaired        Sensory (Neurological): Unimpaired        DTR: Patellar:  deferred today Achilles: deferred today Plantar: deferred today  DTR: Patellar: deferred today Achilles: deferred today Plantar: deferred today  Palpation: No palpable anomalies  Palpation: No palpable anomalies    Assessment   Diagnosis Status  1. Thoracic facet joint syndrome   2. Thoracic degenerative disc disease   3. Chronic pain syndrome    Flare-up Flare-up Flare-up    Plan of Care  1. Thoracic facet joint syndrome (Primary) - THORACIC FACET BLOCK; Future  2. Thoracic degenerative disc disease - THORACIC FACET BLOCK; Future  3. Chronic pain syndrome - THORACIC FACET BLOCK; Future  Excellent response from previous thoracic facet medial branch nerve blocks done in 2023.  Given return of pain, repeat thoracic facet medial branch nerve block at T11, T12, L1 and then consider RFA thereafter.    Orders:  Orders Placed This Encounter  Procedures   THORACIC FACET BLOCK    Standing Status:   Future    Expected Date:   06/12/2024    Expiration Date:   05/23/2025    Scheduling Instructions:     Thoracic Medial Branch Block     Side: Bilateral T11, T12, L1     Sedation: Patient's choice.     Timeframe: ASAA    Where will this procedure be performed?:   [facility]   Follow-up plan:   Return in about 20 days (around 06/12/2024) for B/L T11, T12, L1 MBNB , in clinic NS.     B/L L3,4,5 MBNB #1 07/13/22     Recent Visits No visits were found meeting these conditions. Showing recent visits within past 90 days and meeting all other requirements Today's Visits Date Type Provider Dept  05/23/24 Office Visit Marcelino Nurse, MD [facility]  Showing today's visits and meeting all other requirements Future Appointments No visits were found meeting these conditions. Showing future appointments within next 90 days and meeting all other requirements  I discussed the assessment and treatment plan with the patient. The patient was provided an opportunity to ask  questions and all were answered. The patient agreed with the plan and demonstrated an understanding of the instructions.  Patient advised to call back or seek an in-person evaluation if the symptoms or condition worsens.  Duration of encounter: .  Total time on encounter, as per AMA guidelines included both the face-to-face and non-face-to-face time personally spent by the physician and/or other qualified health care professional(s) on the day of the encounter (includes time in activities that require the physician or other qualified health care professional and does not include time in activities normally performed by clinical staff). Physician's time may include the following activities when performed: preparing to see the patient (eg, review of tests, pre-charting review of records) obtaining and/or reviewing separately obtained history performing a medically appropriate examination and/or evaluation counseling and educating the patient/family/caregiver ordering medications, tests, or procedures referring and communicating with other  health care professionals (when not separately reported) documenting clinical information in the electronic or other health record independently interpreting results (not separately reported) and communicating results to the patient/ family/caregiver care coordination (not separately reported)  Note by: Wallie Sherry, MD Date: 05/23/2024; Time: 1:50 PM

## 2024-05-23 NOTE — Progress Notes (Signed)
 Safety precautions to be maintained throughout the outpatient stay will include: orient to surroundings, keep bed in low position, maintain call bell within reach at all times, provide assistance with transfer out of bed and ambulation.

## 2024-05-23 NOTE — Patient Instructions (Signed)
____________________________________________________________________________________________  General Risks and Possible Complications  Patient Responsibilities: It is important that you read this as it is part of your informed consent. It is our duty to inform you of the risks and possible complications associated with treatments offered to you. It is your responsibility as a patient to read this and to ask questions about anything that is not clear or that you believe was not covered in this document.  Patient's Rights: You have the right to refuse treatment. You also have the right to change your mind, even after initially having agreed to have the treatment done. However, under this last option, if you wait until the last second to change your mind, you may be charged for the materials used up to that point.  Introduction: Medicine is not an exact science. Everything in Medicine, including the lack of treatment(s), carries the potential for danger, harm, or loss (which is by definition: Risk). In Medicine, a complication is a secondary problem, condition, or disease that can aggravate an already existing one. All treatments carry the risk of possible complications. The fact that a side effects or complications occurs, does not imply that the treatment was conducted incorrectly. It must be clearly understood that these can happen even when everything is done following the highest safety standards.  No treatment: You can choose not to proceed with the proposed treatment alternative. The "PRO(s)" would include: avoiding the risk of complications associated with the therapy. The "CON(s)" would include: not getting any of the treatment benefits. These benefits fall under one of three categories: diagnostic; therapeutic; and/or palliative. Diagnostic benefits include: getting information which can ultimately lead to improvement of the disease or symptom(s). Therapeutic benefits are those associated with the  successful treatment of the disease. Finally, palliative benefits are those related to the decrease of the primary symptoms, without necessarily curing the condition (example: decreasing the pain from a flare-up of a chronic condition, such as incurable terminal cancer).  General Risks and Complications: These are associated to most interventional treatments. They can occur alone, or in combination. They fall under one of the following six (6) categories: no benefit or worsening of symptoms; bleeding; infection; nerve damage; allergic reactions; and/or death. No benefits or worsening of symptoms: In Medicine there are no guarantees, only probabilities. No healthcare provider can ever guarantee that a medical treatment will work, they can only state the probability that it may. Furthermore, there is always the possibility that the condition may worsen, either directly, or indirectly, as a consequence of the treatment. Bleeding: This is more common if the patient is taking a blood thinner, either prescription or over the counter (example: Goody Powders, Fish oil, Aspirin, Garlic, etc.), or if suffering a condition associated with impaired coagulation (example: Hemophilia, cirrhosis of the liver, low platelet counts, etc.). However, even if you do not have one on these, it can still happen. If you have any of these conditions, or take one of these drugs, make sure to notify your treating physician. Infection: This is more common in patients with a compromised immune system, either due to disease (example: diabetes, cancer, human immunodeficiency virus [HIV], etc.), or due to medications or treatments (example: therapies used to treat cancer and rheumatological diseases). However, even if you do not have one on these, it can still happen. If you have any of these conditions, or take one of these drugs, make sure to notify your treating physician. Nerve Damage: This is more common when the treatment is an invasive    one, but it can also happen with the use of medications, such as those used in the treatment of cancer. The damage can occur to small secondary nerves, or to large primary ones, such as those in the spinal cord and brain. This damage may be temporary or permanent and it may lead to impairments that can range from temporary numbness to permanent paralysis and/or brain death. Allergic Reactions: Any time a substance or material comes in contact with our body, there is the possibility of an allergic reaction. These can range from a mild skin rash (contact dermatitis) to a severe systemic reaction (anaphylactic reaction), which can result in death. Death: In general, any medical intervention can result in death, most of the time due to an unforeseen complication. ____________________________________________________________________________________________ Facet Blocks Patient Information  Description: The facets are joints in the spine between the vertebrae.  Like any joints in the body, facets can become irritated and painful.  Arthritis can also effect the facets.  By injecting steroids and local anesthetic in and around these joints, we can temporarily block the nerve supply to them.  Steroids act directly on irritated nerves and tissues to reduce selling and inflammation which often leads to decreased pain.  Facet blocks may be done anywhere along the spine from the neck to the low back depending upon the location of your pain.   After numbing the skin with local anesthetic (like Novocaine), a small needle is passed onto the facet joints under x-ray guidance.  You may experience a sensation of pressure while this is being done.  The entire block usually lasts about 15-25 minutes.   Conditions which may be treated by facet blocks:  Low back/buttock pain Neck/shoulder pain Certain types of headaches  Preparation for the injection:  Do not eat any solid food or dairy products within 8 hours of your  appointment. You may drink clear liquid up to 3 hours before appointment.  Clear liquids include water, black coffee, juice or soda.  No milk or cream please. You may take your regular medication, including pain medications, with a sip of water before your appointment.  Diabetics should hold regular insulin (if taken separately) and take 1/2 normal NPH dose the morning of the procedure.  Carry some sugar containing items with you to your appointment. A driver must accompany you and be prepared to drive you home after your procedure. Bring all your current medications with you. An IV may be inserted and sedation may be given at the discretion of the physician. A blood pressure cuff, EKG and other monitors will often be applied during the procedure.  Some patients may need to have extra oxygen administered for a short period. You will be asked to provide medical information, including your allergies and medications, prior to the procedure.  We must know immediately if you are taking blood thinners (like Coumadin/Warfarin) or if you are allergic to IV iodine contrast (dye).  We must know if you could possible be pregnant.  Possible side-effects:  Bleeding from needle site Infection (rare, may require surgery) Nerve injury (rare) Numbness & tingling (temporary) Difficulty urinating (rare, temporary) Spinal headache (a headache worse with upright posture) Light-headedness (temporary) Pain at injection site (serveral days) Decreased blood pressure (rare, temporary) Weakness in arm/leg (temporary) Pressure sensation in back/neck (temporary)   Call if you experience:  Fever/chills associated with headache or increased back/neck pain Headache worsened by an upright position New onset, weakness or numbness of an extremity below the injection site Hives or   difficulty breathing (go to the emergency room) Inflammation or drainage at the injection site(s) Severe back/neck pain greater than  usual New symptoms which are concerning to you  Please note:  Although the local anesthetic injected can often make your back or neck feel good for several hours after the injection, the pain will likely return. It takes 3-7 days for steroids to work.  You may not notice any pain relief for at least one week.  If effective, we will often do a series of 2-3 injections spaced 3-6 weeks apart to maximally decrease your pain.  After the initial series, you may be a candidate for a more permanent nerve block of the facets.  If you have any questions, please call [phone] [facility] 

## 2024-06-12 ENCOUNTER — Encounter: Payer: Self-pay | Admitting: Student in an Organized Health Care Education/Training Program

## 2024-06-12 ENCOUNTER — Ambulatory Visit (HOSPITAL_BASED_OUTPATIENT_CLINIC_OR_DEPARTMENT_OTHER): Admitting: Student in an Organized Health Care Education/Training Program

## 2024-06-12 ENCOUNTER — Ambulatory Visit
Admission: RE | Admit: 2024-06-12 | Discharge: 2024-06-12 | Disposition: A | Discharge status: Home / Self Care | Source: Ambulatory Visit | Attending: Student in an Organized Health Care Education/Training Program | Admitting: Student in an Organized Health Care Education/Training Program

## 2024-06-12 VITALS — BP 162/92 | HR 75 | Temp 96.7°F | Resp 16 | Ht 66.0 in | Wt 113.6 lb

## 2024-06-12 DIAGNOSIS — M5134 Other intervertebral disc degeneration, thoracic region: Secondary | ICD-10-CM

## 2024-06-12 DIAGNOSIS — M47894 Other spondylosis, thoracic region: Secondary | ICD-10-CM | POA: Diagnosis not present

## 2024-06-12 DIAGNOSIS — G894 Chronic pain syndrome: Secondary | ICD-10-CM

## 2024-06-12 MED ORDER — ROPIVACAINE HCL 2 MG/ML IJ SOLN
INTRAMUSCULAR | Status: AC
  Filled 2024-06-12: qty 20

## 2024-06-12 MED ORDER — LIDOCAINE HCL 2 % IJ SOLN
20.0000 mL | Freq: Once | INTRAMUSCULAR | Status: AC
  Administered 2024-06-12: 400 mg

## 2024-06-12 MED ORDER — LIDOCAINE HCL 2 % IJ SOLN
INTRAMUSCULAR | Status: AC
  Filled 2024-06-12: qty 20

## 2024-06-12 MED ORDER — DEXAMETHASONE SODIUM PHOSPHATE 10 MG/ML IJ SOLN
20.0000 mg | Freq: Once | INTRAMUSCULAR | Status: AC
  Administered 2024-06-12: 20 mg

## 2024-06-12 MED ORDER — ROPIVACAINE HCL 2 MG/ML IJ SOLN
18.0000 mL | Freq: Once | INTRAMUSCULAR | Status: AC
  Administered 2024-06-12: 18 mL via PERINEURAL

## 2024-06-12 MED ORDER — DEXAMETHASONE SODIUM PHOSPHATE 10 MG/ML IJ SOLN
INTRAMUSCULAR | Status: AC
  Filled 2024-06-12: qty 2

## 2024-06-12 NOTE — Progress Notes (Signed)
 PROVIDER NOTE: Interpretation of information contained herein should be left to medically-trained personnel. Specific patient instructions are provided elsewhere under Patient Instructions section of medical record. This document was created in part using STT-dictation technology, any transcriptional errors that may result from this process are unintentional.  Patient: Mary Mosley Type: Established DOB: Mar 29, 1941 MRN: [id number] PCP: Mary Nancyann BRAVO, MD  Service: Procedure DOS: 06/12/2024 Setting: Ambulatory Location: Ambulatory outpatient facility Delivery: Face-to-face Provider: Wallie Sherry, MD Specialty: Interventional Pain Management Specialty designation: 09 Location: Outpatient facility Ref. Prov.: Mary Nancyann BRAVO, MD    Primary Reason for Visit: Interventional Pain Management Treatment. CC: Back Pain (Mid back pain )   Procedure:            [Reference CPT: 64461(single); 64462(additional levels)]   Type: Thoracic facet medial branch block #2  Laterality: Bilateral (-50)  Level: T11 and T12 and L1 Medial Branch Level(s)  Imaging: Fluoroscopy-guided         Anesthesia: Local anesthesia (1-2% Lidocaine ) DOS: 06/12/2024  Performed by: Wallie Sherry, MD  Purpose: Diagnostic/Therapeutic Indications: Thoracic back pain severe enough to impact quality of life or function. Rationale (medical necessity): procedure needed and proper for the diagnosis and/or treatment of Mary Mosley's medical symptoms and needs. 1. Thoracic facet joint syndrome   2. Thoracic degenerative disc disease   3. Chronic pain syndrome    NAS-11 Pain score:   Pre-procedure: 8 /10   Post-procedure: 8 /10     Target: Thoracic posterior (dorsal) primary rami nerve Location: 2.5 cm lateral to midline (spinous process), just cephalad to upper transverse process edge.  (needle tip placement) Region: Thoracic  Approach: Paravertebral  Type of procedure: Percutaneous perineural nerve block   Pertinent  Anatomy: There are two potential fascial compartments in the TPVS: the anterior extrapleural paravertebral compartment and the posterior subendothoracic paravertebral compartment. The transverse process and the superior costotransverse ligament form the posterior boundary.  Position / Prep / Materials:  Position: Prone  Prep solution: DuraPrep (Iodine Povacrylex [0.7% available iodine] and Isopropyl Alcohol, 74% w/w) Prep Area: Entire upper back region Materials:  Tray: Block Needle(s):  Type: Spinal  Gauge (G): 25  Length: 3.5-in  Qty: 2  Pre-op H&P Assessment:  Mary Mosley is a 83 y.o. (year old), female patient, seen today for interventional treatment. She  has a past surgical history that includes Eye surgery (Bilateral); Tonsillectomy and adenoidectomy; Cataract extraction w/PHACO (Right, 06/07/2021); Cataract extraction w/PHACO (Left, 06/21/2021); and Hip Arthroplasty (Right, 11/09/2023). Mary Mosley has a current medication list which includes the following prescription(s): bisacodyl , cholecalciferol, diphenhydramine , multiple vitamins-minerals, naproxen sodium, trazodone , and risedronate . Her primarily concern today is the Back Pain (Mid back pain )  Initial Vital Signs:  Pulse/HCG Rate: 75ECG Heart Rate: 76 Temp: (!) 96.7 F (35.9 C) Resp: 16 BP: (!) 148/74 SpO2: 100 %  BMI: Estimated body mass index is 18.34 kg/m as calculated from the following:   Height as of this encounter: 5' 6 (1.676 m).   Weight as of this encounter: 113 lb 9.6 oz (51.5 kg).  Risk Assessment: Allergies: Reviewed. She is allergic to amlodipine , diltiazem  hcl, fosamax  [alendronate  sodium], gabapentin , hydrochlorothiazide , penicillins, and actonel  [risedronate ].  Allergy Precautions: None required Coagulopathies: Reviewed. None identified.  Blood-thinner therapy: None at this time Active Infection(s): Reviewed. None identified. Mary Mosley is afebrile  Site Confirmation: Mary Mosley was asked to confirm  the procedure and laterality before marking the site Procedure checklist: Completed Consent: Before the procedure and under the influence of no sedative(s),  amnesic(s), or anxiolytics, the patient was informed of the treatment options, risks and possible complications. To fulfill our ethical and legal obligations, as recommended by the American Medical Association's Code of Ethics, I have informed the patient of my clinical impression; the nature and purpose of the treatment or procedure; the risks, benefits, and possible complications of the intervention; the alternatives, including doing nothing; the risk(s) and benefit(s) of the alternative treatment(s) or procedure(s); and the risk(s) and benefit(s) of doing nothing. The patient was provided information about the general risks and possible complications associated with the procedure. These may include, but are not limited to: failure to achieve desired goals, infection, bleeding, organ or nerve damage, allergic reactions, paralysis, and death. In addition, the patient was informed of those risks and complications associated to Spine-related procedures, such as failure to decrease pain; infection (i.e.: Meningitis, epidural or intraspinal abscess); bleeding (i.e.: epidural hematoma, subarachnoid hemorrhage, or any other type of intraspinal or peri-dural bleeding); organ or nerve damage (i.e.: Any type of peripheral nerve, nerve root, or spinal cord injury) with subsequent damage to sensory, motor, and/or autonomic systems, resulting in permanent pain, numbness, and/or weakness of one or several areas of the body; allergic reactions; (i.e.: anaphylactic reaction); and/or death. Furthermore, the patient was informed of those risks and complications associated with the medications. These include, but are not limited to: allergic reactions (i.e.: anaphylactic or anaphylactoid reaction(s)); adrenal axis suppression; blood sugar elevation that in diabetics may  result in ketoacidosis or comma; water retention that in patients with history of congestive heart failure may result in shortness of breath, pulmonary edema, and decompensation with resultant heart failure; weight gain; swelling or edema; medication-induced neural toxicity; particulate matter embolism and blood vessel occlusion with resultant organ, and/or nervous system infarction; and/or aseptic necrosis of one or more joints. Finally, the patient was informed that Medicine is not an exact science; therefore, there is also the possibility of unforeseen or unpredictable risks and/or possible complications that may result in a catastrophic outcome. The patient indicated having understood very clearly. We have given the patient no guarantees and we have made no promises. Enough time was given to the patient to ask questions, all of which were answered to the patient's satisfaction. Ms. Aker has indicated that she wanted to continue with the procedure. Attestation: I, the ordering provider, attest that I have discussed with the patient the benefits, risks, side-effects, alternatives, likelihood of achieving goals, and potential problems during recovery for the procedure that I have provided informed consent. Date  Time: 06/12/2024 10:08 AM  Pre-Procedure Preparation:  Monitoring: As per clinic protocol. Respiration, ETCO2, SpO2, BP, heart rate and rhythm monitor placed and checked for adequate function Safety Precautions: Patient was assessed for positional comfort and pressure points before starting the procedure. Time-out: I initiated and conducted the Time-out before starting the procedure, as per protocol. The patient was asked to participate by confirming the accuracy of the Time Out information. Verification of the correct person, site, and procedure were performed and confirmed by me, the nursing staff, and the patient. Time-out conducted as per Joint Commission's Universal Protocol  (UP.01.01.01). Time: 1041  Description/Narrative of Procedure:        T12-L1 medial branch nerve block, bilateral  Technical description of procedure:  Rationale (medical necessity): procedure needed and proper for the diagnosis and/or treatment of the patient's medical symptoms and needs. Procedural Technique Safety Precautions: Aspiration looking for blood return was conducted prior to all injections. At no point did we inject  any substances, as a needle was being advanced. No attempts were made at seeking any paresthesias. Safe injection practices and needle disposal techniques used. Medications properly checked for expiration dates. SDV (single dose vial) medications used. Target Area: For the thoracic medial branch nerve, the target is located between the top of the transverse processes at the point where the transverse process joints the vertebra and the superior-lateral aspect of the transverse process.  (More lateral towards the superior aspect of the thoracic spine.) For safety reasons and to minimize the risk of hemo- or pneumothorax, the needle tip was not advanced past the boundary of the posterior paravertebral compartment (superior costotransverse ligament). Description of the Procedure: Protocol guidelines were followed. The patient was placed in position over the fluoroscopy table. The target area was identified and the area prepped in the usual manner. Skin & deeper tissues infiltrated with local anesthetic. Appropriate amount of time allowed to pass for local anesthetics to take effect. The procedure needles were then advanced to the target area. Proper needle placement secured. Negative aspiration confirmed. Solution injected in intermittent fashion, asking for systemic symptoms every 0.5cc of injectate. The needles were then removed and the area cleansed, making sure to leave some of the prepping solution back to take advantage of its long term bactericidal properties.           Vitals:   06/12/24 1031 06/12/24 1036 06/12/24 1042 06/12/24 1047  BP: (!) 145/81 (!) 143/83 (!) 154/87 (!) 162/92  Pulse:      Resp: 18 17 17 16   Temp:      TempSrc:      SpO2: 98% 99% 98% 97%  Weight:      Height:        12 cc solution made of 10 cc of 0.2% ropivacaine , 2 cc of Decadron  10 mg/cc.  2 cc injected at each level above bilaterally.  Start Time: 1041 hrs. End Time:   hrs.  Imaging Guidance:          Type of Imaging Technique: Fluoroscopy Guidance (Spinal) Indication(s): Assistance in needle guidance and placement for procedures requiring needle placement in or near specific anatomical locations not easily accessible without such assistance. Exposure Time: Please see nurses notes. Contrast: None used. Fluoroscopic Guidance: I was personally present during the use of fluoroscopy. Tunnel Vision Technique used to obtain the best possible view of the target area. Parallax error corrected before commencing the procedure. Direction-depth-direction technique used to introduce the needle under continuous pulsed fluoroscopy. Once target was reached, antero-posterior, oblique, and lateral fluoroscopic projection used confirm needle placement in all planes. Images permanently stored in EMR. Ultrasound Guidance: N/A Interpretation: I personally interpreted the imaging intraoperatively. Adequate needle placement confirmed in multiple planes. Appropriate spread of contrast into desired area was observed. No evidence of afferent or efferent intravascular uptake. No intrathecal or subarachnoid spread observed. Permanent images saved into the patient's record.  Post-operative Assessment:  Post-procedure Vital Signs:  Pulse/HCG Rate: 7572 Temp: (!) 96.7 F (35.9 C) Resp: 16 BP: (!) 162/92 SpO2: 97 %  EBL: None  Complications: No immediate post-treatment complications observed by team, or reported by patient.  Note: The patient tolerated the entire procedure well. A repeat set  of vitals were taken after the procedure and the patient was kept under observation following institutional policy, for this type of procedure. Post-procedural neurological assessment was performed, showing return to baseline, prior to discharge. The patient was provided with post-procedure discharge instructions, including a section on how to  identify potential problems. Should any problems arise concerning this procedure, the patient was given instructions to immediately contact us , at any time, without hesitation. In any case, we plan to contact the patient by telephone for a follow-up status report regarding this interventional procedure.  Comments:  No additional relevant information.  Plan of Care  Orders:  Orders Placed This Encounter  Procedures   DG PAIN CLIN[facility] C-ARM 1-60 MIN NO REPORT    Intraoperative interpretation by procedural physician at [facility].    Standing Status:   Standing    Number of Occurrences:   1    Reason for exam::   Assistance in needle guidance and placement for procedures requiring needle placement in or near specific anatomical locations not easily accessible without such assistance.    Medications ordered for procedure: Meds ordered this encounter  Medications   lidocaine  (XYLOCAINE ) 2 % (with pres) injection 400 mg   ropivacaine  (PF) 2 mg/mL (0.2%) (NAROPIN ) injection 18 mL   dexamethasone  (DECADRON ) injection 20 mg   Medications administered: We administered lidocaine , ropivacaine  (PF) 2 mg/mL (0.2%), and dexamethasone .  See the medical record for exact dosing, route, and time of administration.  Follow-up plan:   Return for f74f PPE 4-6 WEEKS.       B/L L3,4,5 MBNB #1 07/13/22, B/L T12/L1 08/29/22, B/L T11, T12, L1 MBNB 06/12/24    Recent Visits Date Type Provider Dept  05/23/24 Office Visit Marcelino Nurse, MD [facility]  Showing recent visits within past 90 days and meeting all other requirements Today's Visits Date Type  Provider Dept  06/12/24 Procedure visit Marcelino Nurse, MD [facility]  Showing today's visits and meeting all other requirements Future Appointments Date Type Provider Dept  07/18/24 Appointment Marcelino Nurse, MD [facility]  Showing future appointments within next 90 days and meeting all other requirements  Disposition: Discharge home  Discharge (Date  Time): 06/12/2024; 1100 hrs.   Primary Care Physician: Mary Nancyann BRAVO, MD Location: [facility] Note by: Nurse Marcelino, MD Date: 06/12/2024; Time: 11:50 AM  Disclaimer:  Medicine is not an exact science. The only guarantee in medicine is that nothing is guaranteed. It is important to note that the decision to proceed with this intervention was based on the information collected from the patient. The Data and conclusions were drawn from the patient's questionnaire, the interview, and the physical examination. Because the information was provided in large part by the patient, it cannot be guaranteed that it has not been purposely or unconsciously manipulated. Every effort has been made to obtain as much relevant data as possible for this evaluation. It is important to note that the conclusions that lead to this procedure are derived in large part from the available data. Always take into account that the treatment will also be dependent on availability of resources and existing treatment guidelines, considered by other Pain Management Practitioners as being common knowledge and practice, at the time of the intervention. For Medico-Legal purposes, it is also important to point out that variation in procedural techniques and pharmacological choices are the acceptable norm. The indications, contraindications, technique, and results of the above procedure should only be interpreted and judged by a Board-Certified Interventional Pain Specialist with extensive familiarity and expertise in the same exact  procedure and technique.

## 2024-06-12 NOTE — Patient Instructions (Signed)
 Pain Management Discharge Instructions  General Discharge Instructions :  If you need to reach your doctor call: Monday-Friday 8:00 am - 4:00 pm at [phone] or toll free [phone].  After clinic hours [phone] to have operator reach doctor.  Bring all of your medication bottles to all your appointments in the pain clinic.  To cancel or reschedule your appointment with Pain Management please remember to call 24 hours in advance to avoid a fee.  Refer to the educational materials which you have been given on: General Risks, I had my Procedure. Discharge Instructions, Post Sedation.  Post Procedure Instructions:  The drugs you were given will stay in your system until tomorrow, so for the next 24 hours you should not drive, make any legal decisions or drink any alcoholic beverages.  You may eat anything you prefer, but it is better to start with liquids then soups and crackers, and gradually work up to solid foods.  Please notify your doctor immediately if you have any unusual bleeding, trouble breathing or pain that is not related to your normal pain.  Depending on the type of procedure that was done, some parts of your body may feel week and/or numb.  This usually clears up by tonight or the next day.  Walk with the use of an assistive device or accompanied by an adult for the 24 hours.  You may use ice on the affected area for the first 24 hours.  Put ice in a Ziploc bag and cover with a towel and place against area 15 minutes on 15 minutes off.  You may switch to heat after 24 hours.Facet Blocks Patient Information  Description: The facets are joints in the spine between the vertebrae.  Like any joints in the body, facets can become irritated and painful.  Arthritis can also effect the facets.  By injecting steroids and local anesthetic in and around these joints, we can temporarily block the nerve supply to them.  Steroids act directly on irritated nerves and tissues to  reduce selling and inflammation which often leads to decreased pain.  Facet blocks may be done anywhere along the spine from the neck to the low back depending upon the location of your pain.   After numbing the skin with local anesthetic (like Novocaine), a small needle is passed onto the facet joints under x-ray guidance.  You may experience a sensation of pressure while this is being done.  The entire block usually lasts about 15-25 minutes.   Conditions which may be treated by facet blocks:  Low back/buttock pain Neck/shoulder pain Certain types of headaches  Preparation for the injection:  Do not eat any solid food or dairy products within 8 hours of your appointment. You may drink clear liquid up to 3 hours before appointment.  Clear liquids include water, black coffee, juice or soda.  No milk or cream please. You may take your regular medication, including pain medications, with a sip of water before your appointment.  Diabetics should hold regular insulin (if taken separately) and take 1/2 normal NPH dose the morning of the procedure.  Carry some sugar containing items with you to your appointment. A driver must accompany you and be prepared to drive you home after your procedure. Bring all your current medications with you. An IV may be inserted and sedation may be given at the discretion of the physician. A blood pressure cuff, EKG and other monitors will often be applied during the procedure.  Some patients may need to  have extra oxygen administered for a short period. You will be asked to provide medical information, including your allergies and medications, prior to the procedure.  We must know immediately if you are taking blood thinners (like Coumadin/Warfarin) or if you are allergic to IV iodine contrast (dye).  We must know if you could possible be pregnant.  Possible side-effects:  Bleeding from needle site Infection (rare, may require surgery) Nerve injury (rare) Numbness  & tingling (temporary) Difficulty urinating (rare, temporary) Spinal headache (a headache worse with upright posture) Light-headedness (temporary) Pain at injection site (serveral days) Decreased blood pressure (rare, temporary) Weakness in arm/leg (temporary) Pressure sensation in back/neck (temporary)   Call if you experience:  Fever/chills associated with headache or increased back/neck pain Headache worsened by an upright position New onset, weakness or numbness of an extremity below the injection site Hives or difficulty breathing (go to the emergency room) Inflammation or drainage at the injection site(s) Severe back/neck pain greater than usual New symptoms which are concerning to you  Please note:  Although the local anesthetic injected can often make your back or neck feel good for several hours after the injection, the pain will likely return. It takes 3-7 days for steroids to work.  You may not notice any pain relief for at least one week.  If effective, we will often do a series of 2-3 injections spaced 3-6 weeks apart to maximally decrease your pain.  After the initial series, you may be a candidate for a more permanent nerve block of the facets.  If you have any questions, please call #[phone] [facility]

## 2024-06-12 NOTE — Progress Notes (Signed)
 Safety precautions to be maintained throughout the outpatient stay will include: orient to surroundings, keep bed in low position, maintain call bell within reach at all times, provide assistance with transfer out of bed and ambulation.

## 2024-06-13 ENCOUNTER — Telehealth: Payer: Self-pay

## 2024-06-13 NOTE — Telephone Encounter (Signed)
Post procedure follow up.  Patient states she is doing great.

## 2024-07-08 ENCOUNTER — Other Ambulatory Visit: Payer: Self-pay | Admitting: Family Medicine

## 2024-07-08 DIAGNOSIS — Z1231 Encounter for screening mammogram for malignant neoplasm of breast: Secondary | ICD-10-CM

## 2024-07-18 ENCOUNTER — Ambulatory Visit
Attending: Student in an Organized Health Care Education/Training Program | Admitting: Student in an Organized Health Care Education/Training Program

## 2024-07-18 VITALS — Temp 97.3°F | Resp 16 | Ht 66.0 in | Wt 115.0 lb

## 2024-07-18 DIAGNOSIS — M47816 Spondylosis without myelopathy or radiculopathy, lumbar region: Secondary | ICD-10-CM | POA: Insufficient documentation

## 2024-07-18 DIAGNOSIS — G894 Chronic pain syndrome: Secondary | ICD-10-CM | POA: Diagnosis not present

## 2024-07-18 NOTE — Progress Notes (Signed)
 PROVIDER NOTE: Interpretation of information contained herein should be left to medically-trained personnel. Specific patient instructions are provided elsewhere under Patient Instructions section of medical record. This document was created in part using AI and STT-dictation technology, any transcriptional errors that may result from this process are unintentional.  Patient: Mary Mosley  Service: E/M   PCP: Gasper Nancyann BRAVO, MD  DOB: [birth year]  DOS: 07/18/2024  Provider: Wallie Sherry, MD  MRN: [id number]  Delivery: Face-to-face  Specialty: Interventional Pain Management  Type: Established Patient  Setting: Ambulatory outpatient facility  Specialty designation: 09  Referring Prov.: Gasper Nancyann BRAVO, MD  Location: Outpatient office facility       History of present illness (HPI) Mary Mosley, a 83 y.o. year old female, is here today because of her Lumbar facet arthropathy [M47.816]. Mary Mosley's primary complain today is Back Pain  Pertinent problems: Mary Mosley has Spinal stenosis, lumbar region, with neurogenic claudication; Lumbar degenerative disc disease; Lumbar spondylosis; Chronic bilateral thoracic back pain; Chronic pain syndrome; and Thoracic facet joint syndrome on their pertinent problem list.  Pain Assessment: Severity of Chronic pain is reported as a 5 /10. Location: Back Mid, Right, Left/denies. Onset: More than a month ago. Quality: Constant, Discomfort. Timing:  . Modifying factor(s): cushion rest. Vitals:  height is 5' 6 (1.676 m) and weight is 115 lb (52.2 kg). Her temperature is 97.3 F (36.3 C) (abnormal). Her respiration is 16 and oxygen saturation is 100%.  BMI: Estimated body mass index is 18.56 kg/m as calculated from the following:   Height as of this encounter: 5' 6 (1.676 m).   Weight as of this encounter: 115 lb (52.2 kg).  Last encounter: 05/23/2024. Last procedure: 06/12/2024.  Reason for encounter: increased lumbar spine pain  Post-Procedure Evaluation    Type: Thoracic facet medial branch block #2  Laterality: Bilateral (-50)  Level: T11 and T12 and L1 Medial Branch Level(s)  Imaging: Fluoroscopy-guided         Anesthesia: Local anesthesia (1-2% Lidocaine ) DOS: 06/12/2024  Performed by: Wallie Sherry, MD  Purpose: Diagnostic/Therapeutic Indications: Thoracic back pain severe enough to impact quality of life or function. Rationale (medical necessity): procedure needed and proper for the diagnosis and/or treatment of Mary Mosley's medical symptoms and needs. 1. Thoracic facet joint syndrome   2. Thoracic degenerative disc disease   3. Chronic pain syndrome    NAS-11 Pain score:   Pre-procedure: 8 /10   Post-procedure: 8 /10     Effectiveness:  Initial hour after procedure: 100 %  Subsequent 4-6 hours post-procedure: 100 %  Analgesia past initial 6 hours: 90 % (current)  Ongoing improvement:  Analgesic:  90% Function: Somewhat improved ROM: Somewhat improved   ROS  Constitutional: Denies any fever or chills Gastrointestinal: No reported hemesis, hematochezia, vomiting, or acute GI distress Musculoskeletal: Axial low back pain Neurological: No reported episodes of acute onset apraxia, aphasia, dysarthria, agnosia, amnesia, paralysis, loss of coordination, or loss of consciousness  Medication Review  Multiple Vitamins-Minerals, bisacodyl , cholecalciferol, diphenhydrAMINE , naproxen sodium, risedronate , and traZODone   History Review  Allergy: Mary Mosley is allergic to amlodipine , diltiazem  hcl, fosamax  [alendronate  sodium], gabapentin , hydrochlorothiazide , penicillins, and actonel  [risedronate ]. Drug: Mary Mosley  reports no history of drug use. Alcohol:  reports no history of alcohol use. Tobacco:  reports that she has never smoked. She has never used smokeless tobacco. Social: Mary Mosley  reports that she has never smoked. She has never used smokeless tobacco. She reports that she does not drink  alcohol and does not use  drugs. Medical:  has a past medical history of Hypertension, OSA (obstructive sleep apnea), and Osteopenia. Surgical: Mary Mosley  has a past surgical history that includes Eye surgery (Bilateral); Tonsillectomy and adenoidectomy; Cataract extraction w/PHACO (Right, 06/07/2021); Cataract extraction w/PHACO (Left, 06/21/2021); and Hip Arthroplasty (Right, 11/09/2023). Family: family history includes Diabetes in her father; Heart Problems in her brother and brother; Heart attack in her mother; Liver cancer in her son; Lung cancer in her brother; Other (age of onset: 39) in her father.  Laboratory Chemistry Profile   Renal Lab Results  Component Value Date   BUN 18 11/10/2023   CREATININE 0.84 11/10/2023   BCR 15 09/13/2022   GFRAA 74 09/11/2020   GFRNONAA >60 11/10/2023    Hepatic Lab Results  Component Value Date   AST 29 11/09/2023   ALT 21 11/09/2023   ALBUMIN  4.7 11/09/2023   ALKPHOS 56 11/09/2023    Electrolytes Lab Results  Component Value Date   NA 136 11/10/2023   K 4.2 11/10/2023   CL 104 11/10/2023   CALCIUM 8.8 (L) 11/10/2023    Bone Lab Results  Component Value Date   VD25OH 56.4 09/17/2018    Inflammation (CRP: Acute Phase) (ESR: Chronic Phase) No results found for: CRP, ESRSEDRATE, LATICACIDVEN       Note: Above Lab results reviewed.  Recent Imaging Review  LUMBAR SPINE - COMPLETE WITH BENDING VIEWS   COMPARISON:  None Available.   FINDINGS: There is no evidence of lumbar spine fracture. Scoliosis of spine is noted. Chronic compression deformity of L1 and L2 are noted. Degenerative joint changes with narrowed joint space and osteophyte formation are identified in the upper and lower lumbar spine.   IMPRESSION: No acute fracture or dislocation identified. Degenerative joint changes of lumbar spine.  Lumbar facet arthropathy at L3-L4, L4-L5, L5-S1   Note: Reviewed        Physical Exam  Vitals: Temp (!) 97.3 F (36.3 C)   Resp 16   Ht 5' 6  (1.676 m)   Wt 115 lb (52.2 kg)   SpO2 100%   BMI 18.56 kg/m  BMI: Estimated body mass index is 18.56 kg/m as calculated from the following:   Height as of this encounter: 5' 6 (1.676 m).   Weight as of this encounter: 115 lb (52.2 kg). Ideal: Ideal body weight: 59.3 kg (130 lb 11.7 oz) General appearance: Well nourished, well developed, and well hydrated. In no apparent acute distress Mental status: Alert, oriented x 3 (person, place, & time)       Respiratory: No evidence of acute respiratory distress Eyes: PERLA Low back pain with lumbar extension, tender to palpation over L4-L5  Assessment   Diagnosis Status  1. Lumbar facet arthropathy   2. Lumbar spondylosis   3. Chronic pain syndrome    Having a Flare-up Having a Flare-up Controlled   Updated Problems: No problems updated.  Plan of Care  Patient is status post diagnostic lumbar facet medial branch nerve block bilaterally at L3, L4, L5 on 07/13/2022.  This provided her with 80% pain relief for many years, slightly over 2 years.  She is endorsing increased axial low back pain that is worse with lumbar extension and facet loading.  We discussed repeating diagnostic lumbar facet medial branch nerve block #2 then consider lumbar RFA.  Her mid back is doing well after her thoracic medial branch nerve block. Orders:  Orders Placed This Encounter  Procedures   LUMBAR  FACET(MEDIAL BRANCH NERVE BLOCK) MBNB    Diagnosis: Lumbar Facet Syndrome (M47.816); Lumbosacral Facet Syndrome (M47.817); Lumbar Facet Joint Pain (M54.59) Medical Necessity Statement: 1.Severe chronic axial low back pain causing functional impairment documented by ongoing pain scale assessments. 2.Pain present for longer than 3 months (Chronic) documented to have failed noninvasive conservative therapies. 3.Absence of untreated radiculopathy. 4.There is no radiological evidence of untreated fractures, tumor, infection, or deformity.  Physical Examination  Findings: Positive Kemp Maneuver: (Y)  Positive Lumbar Hyperextension-Rotation provocative test: (Y)    Standing Status:   Future    Expected Date:   08/12/2024    Expiration Date:   07/18/2025    Scheduling Instructions:     Type: Lumbar Facet, Medial Branch Block(s) #2      Laterality: Bilateral      Level: L3, L4, L5, Medial Branch Level(s).      Imaging: Fluoroscopic guidance             Anesthesia: Local anesthesia (1-2% Lidocaine )     Performed by: Wallie Sherry, MD    Where will this procedure be performed?:   [facility]     B/L L3,4,5 MBNB #1 07/13/22, B/L T12/L1 08/29/22, B/L T11, T12, L1 MBNB 06/12/24     Return in about 25 days (around 08/12/2024) for B/L L3, 4, 5 MBNB 2, in clinic NS.    Recent Visits Date Type Provider Dept  06/12/24 Procedure visit Sherry Wallie, MD [facility]  05/23/24 Office Visit Sherry Wallie, MD [facility]  Showing recent visits within past 90 days and meeting all other requirements Today's Visits Date Type Provider Dept  07/18/24 Office Visit Sherry Wallie, MD [facility]  Showing today's visits and meeting all other requirements Future Appointments Date Type Provider Dept  08/12/24 Appointment Sherry Wallie, MD [facility]  Showing future appointments within next 90 days and meeting all other requirements  I discussed the assessment and treatment plan with the patient. The patient was provided an opportunity to ask questions and all were answered. The patient agreed with the plan and demonstrated an understanding of the instructions.  Patient advised to call back or seek an in-person evaluation if the symptoms or condition worsens.  Duration of encounter: .  Total time on encounter, as per AMA guidelines included both the face-to-face and non-face-to-face time personally spent by the physician and/or other qualified health care professional(s) on the day of the encounter (includes time  in activities that require the physician or other qualified health care professional and does not include time in activities normally performed by clinical staff). Physician's time may include the following activities when performed: Preparing to see the patient (e.g., pre-charting review of records, searching for previously ordered imaging, lab work, and nerve conduction tests) Review of prior analgesic pharmacotherapies. Reviewing PMP Interpreting ordered tests (e.g., lab work, imaging, nerve conduction tests) Performing post-procedure evaluations, including interpretation of diagnostic procedures Obtaining and/or reviewing separately obtained history Performing a medically appropriate examination and/or evaluation Counseling and educating the patient/family/caregiver Ordering medications, tests, or procedures Referring and communicating with other health care professionals (when not separately reported) Documenting clinical information in the electronic or other health record Independently interpreting results (not separately reported) and communicating results to the patient/ family/caregiver Care coordination (not separately reported)  Note by: Wallie Sherry, MD (TTS and AI technology used. I apologize for any typographical errors that were not detected and corrected.) Date: 07/18/2024; Time: 1:15 PM

## 2024-07-18 NOTE — Patient Instructions (Signed)
 ______________________________________________________________________    Preparing for your procedure  Appointments: If you think you may not be able to keep your appointment, call 24-48 hours in advance to cancel. We need time to make it available to others.  Procedure visits are for procedures only. During your procedure appointment there will be: NO Prescription Refills*. NO medication changes or discussions*. NO discussion of disability issues*. NO unrelated pain problem evaluations*. NO evaluations to order other pain procedures*. *These will be addressed at a separate and distinct evaluation encounter on the provider's evaluation schedule and not during procedure days.  Instructions: Food intake: Avoid eating anything solid for at least 8 hours prior to your procedure. Clear liquid intake: You may take clear liquids such as water up to 2 hours prior to your procedure. (No carbonated drinks. No soda.) Transportation: Unless otherwise stated by your physician, bring a driver. (Driver cannot be a Market researcher, Pharmacist, community, or any other form of public transportation.) Morning Medicines: Except for blood thinners, take all of your other morning medications with a sip of water. Make sure to take your heart and blood pressure medicines. If your blood pressure's lower number is above 100, the case will be rescheduled. Blood thinners: Make sure to stop your blood thinners as instructed.  If you take a blood thinner, but were not instructed to stop it, call our office [phone] and ask to talk to a nurse. Not stopping a blood thinner prior to certain procedures could lead to serious complications. Diabetics on insulin: Notify the staff so that you can be scheduled 1st case in the morning. If your diabetes requires high dose insulin, take only  of your normal insulin dose the morning of the procedure and notify the staff that you have done so. Preventing infections: Shower with an antibacterial soap the  morning of your procedure.  Build-up your immune system: Take 1000 mg of Vitamin C with every meal (3 times a day) the day prior to your procedure. Antibiotics: Inform the nursing staff if you are taking any antibiotics or if you have any conditions that may require antibiotics prior to procedures. (Example: recent joint implants)   Pregnancy: If you are pregnant make sure to notify the nursing staff. Not doing so may result in injury to the fetus, including death.  Sickness: If you have a cold, fever, or any active infections, call and cancel or reschedule your procedure. Receiving steroids while having an infection may result in complications. Arrival: You must be in the facility at least 30 minutes prior to your scheduled procedure. Tardiness: Your scheduled time is also the cutoff time. If you do not arrive at least 15 minutes prior to your procedure, you will be rescheduled.  Children: Do not bring any children with you. Make arrangements to keep them home. Dress appropriately: There is always a possibility that your clothing may get soiled. Avoid long dresses. Valuables: Do not bring any jewelry or valuables.  Reasons to call and reschedule or cancel your procedure: (Following these recommendations will minimize the risk of a serious complication.) Surgeries: Avoid having procedures within 2 weeks of any surgery. (Avoid for 2 weeks before or after any surgery). Flu Shots: Avoid having procedures within 2 weeks of a flu shots or . (Avoid for 2 weeks before or after immunizations). Barium: Avoid having a procedure within 7-10 days after having had a radiological study involving the use of radiological contrast. (Myelograms, Barium swallow or enema study). Heart attacks: Avoid any elective procedures or surgeries for the  initial 6 months after a "Myocardial Infarction" (Heart Attack). Blood thinners: It is imperative that you stop these medications before procedures. Let us know if you if you take  any blood thinner.  Infection: Avoid procedures during or within two weeks of an infection (including chest colds or gastrointestinal problems). Symptoms associated with infections include: Localized redness, fever, chills, night sweats or profuse sweating, burning sensation when voiding, cough, congestion, stuffiness, runny nose, sore throat, diarrhea, nausea, vomiting, cold or Flu symptoms, recent or current infections. It is specially important if the infection is over the area that we intend to treat. Heart and lung problems: Symptoms that may suggest an active cardiopulmonary problem include: cough, chest pain, breathing difficulties or shortness of breath, dizziness, ankle swelling, uncontrolled high or unusually low blood pressure, and/or palpitations. If you are experiencing any of these symptoms, cancel your procedure and contact your primary care physician for an evaluation.  Remember:  Regular Business hours are:  Monday to Thursday 8:00 AM to 4:00 PM  Provider's Schedule: Delano Metz, MD:  Procedure days: Tuesday and Thursday 7:30 AM to 4:00 PM  Edward Jolly, MD:  Procedure days: Monday and Wednesday 7:30 AM to 4:00 PM Last  Updated: 10/31/2023 ______________________________________________________________________    Facet Joint Block The facet joints connect the bones of the spine (vertebrae). They let you bend, twist, and make other movements with your spine. They also keep you from bending too far, twisting too far, and making other extreme movements. A facet joint block is a procedure where a numbing medicine (local anesthesia) is injected into a facet joint. Many times, a medicine for inflammation (steroid) is also injected. A facet joint block may be done: To diagnose neck or back pain. If the pain gets better after a facet joint block, the pain is likely coming from the facet joint. If the pain does not get better, the pain is likely not coming from the facet joint. To  treat neck or back pain caused by an inflamed facet joint. To help you with physical therapy or other rehab (rehabilitation) exercises. Tell a health care provider about: Any allergies you have. All medicines you are taking, including vitamins, herbs, eye drops, creams, and over-the-counter medicines. Any problems you or family members have had with anesthesia. Any bleeding problems you have. Any surgeries you have had. Any medical conditions you have or have had. Whether you are pregnant or may be pregnant. What are the risks? Your health care provider will talk with you about risks. These may include: Infection. Allergic reactions to medicines or dyes. Bleeding. Injury to a nerve near where the needle was put in (injection site). Pain at the injection site. Short-term weakness or numbness in areas near the nerves at the injection site. What happens before the procedure? When to stop eating and drinking Follow instructions from your health care provider about what you may eat and drink. Medicines Ask your health care provider about: Changing or stopping your regular medicines. These include any diabetes medicines or blood thinners you take. Taking medicines such as aspirin and ibuprofen. These medicines can thin your blood. Do not take these medicines unless your health care provider tells you to. Taking over-the-counter medicines, vitamins, herbs, and supplements. General instructions If you will be going home right after the procedure, plan to have a responsible adult: Take you home from the hospital or clinic. You will not be allowed to drive. Care for you for the time you are told. Ask your health care provider:  How your injection site will be marked. What steps will be taken to help prevent infection. These may include washing skin with a soap that kills germs. What happens during the procedure?  An IV will be inserted into one of your veins. You will lie on your stomach on  an X-ray table. You may be asked to lie in a different position if you will be getting an injection in your neck. Your injection site will be cleaned with a soap that kills germs and then covered with a germ-free (sterile) drape. A local anesthesia will be put in at the injection site. A type of X-ray machine (fluoroscopy) or CT scan will be used to help find your facet joint. A contrast dye may also be injected into your joint to help show if the needle is at the joint. When your provider knows the needle is at your joint, they will inject anesthesia and anti-inflammatory medicine as needed. The needle will be removed. Pressure will be applied to keep your injection site from bleeding. A bandage (dressing) will be placed over each injection site. The procedure may vary among health care providers and hospitals. What happens after the procedure? Your blood pressure, heart rate, breathing rate, and blood oxygen level will be monitored until you leave the hospital or clinic. This information is not intended to replace advice given to you by your health care provider. Make sure you discuss any questions you have with your health care provider. Document Revised: 05/20/2022 Document Reviewed: 05/20/2022 [employer] Patient Education  2024 [employer].

## 2024-07-18 NOTE — Progress Notes (Signed)
 Safety precautions to be maintained throughout the outpatient stay will include: orient to surroundings, keep bed in low position, maintain call bell within reach at all times, provide assistance with transfer out of bed and ambulation.

## 2024-07-25 ENCOUNTER — Ambulatory Visit
Admission: RE | Admit: 2024-07-25 | Discharge: 2024-07-25 | Disposition: A | Discharge status: Home / Self Care | Source: Ambulatory Visit | Attending: Family Medicine | Admitting: Family Medicine

## 2024-07-25 DIAGNOSIS — Z1231 Encounter for screening mammogram for malignant neoplasm of breast: Secondary | ICD-10-CM | POA: Diagnosis not present

## 2024-08-05 ENCOUNTER — Other Ambulatory Visit: Payer: Self-pay | Admitting: Medical Genetics

## 2024-08-07 DIAGNOSIS — H26493 Other secondary cataract, bilateral: Secondary | ICD-10-CM | POA: Diagnosis not present

## 2024-08-07 DIAGNOSIS — Z961 Presence of intraocular lens: Secondary | ICD-10-CM | POA: Diagnosis not present

## 2024-08-12 ENCOUNTER — Ambulatory Visit
Admission: RE | Admit: 2024-08-12 | Discharge: 2024-08-12 | Disposition: A | Discharge status: Home / Self Care | Source: Ambulatory Visit | Attending: Student in an Organized Health Care Education/Training Program | Admitting: Student in an Organized Health Care Education/Training Program

## 2024-08-12 ENCOUNTER — Ambulatory Visit (HOSPITAL_BASED_OUTPATIENT_CLINIC_OR_DEPARTMENT_OTHER): Admitting: Student in an Organized Health Care Education/Training Program

## 2024-08-12 ENCOUNTER — Encounter: Payer: Self-pay | Admitting: Student in an Organized Health Care Education/Training Program

## 2024-08-12 VITALS — BP 158/81 | HR 76 | Temp 97.3°F | Resp 16 | Ht 66.0 in | Wt 114.0 lb

## 2024-08-12 DIAGNOSIS — G894 Chronic pain syndrome: Secondary | ICD-10-CM | POA: Insufficient documentation

## 2024-08-12 DIAGNOSIS — M47816 Spondylosis without myelopathy or radiculopathy, lumbar region: Secondary | ICD-10-CM | POA: Diagnosis not present

## 2024-08-12 MED ORDER — DEXAMETHASONE SODIUM PHOSPHATE 10 MG/ML IJ SOLN
20.0000 mg | Freq: Once | INTRAMUSCULAR | Status: AC
  Administered 2024-08-12: 20 mg

## 2024-08-12 MED ORDER — ROPIVACAINE HCL 2 MG/ML IJ SOLN
18.0000 mL | Freq: Once | INTRAMUSCULAR | Status: AC
  Administered 2024-08-12: 18 mL via PERINEURAL

## 2024-08-12 MED ORDER — LIDOCAINE HCL 2 % IJ SOLN
20.0000 mL | Freq: Once | INTRAMUSCULAR | Status: AC
  Administered 2024-08-12: 400 mg

## 2024-08-12 MED ORDER — ROPIVACAINE HCL 2 MG/ML IJ SOLN
INTRAMUSCULAR | Status: AC
Start: 2024-08-12 — End: 2024-08-12
  Filled 2024-08-12: qty 20

## 2024-08-12 MED ORDER — LIDOCAINE HCL 2 % IJ SOLN
INTRAMUSCULAR | Status: AC
  Filled 2024-08-12: qty 20

## 2024-08-12 MED ORDER — DEXAMETHASONE SODIUM PHOSPHATE 10 MG/ML IJ SOLN
INTRAMUSCULAR | Status: AC
  Filled 2024-08-12: qty 2

## 2024-08-12 NOTE — Progress Notes (Signed)
 PROVIDER NOTE: Interpretation of information contained herein should be left to medically-trained personnel. Specific patient instructions are provided elsewhere under Patient Instructions section of medical record. This document was created in part using STT-dictation technology, any transcriptional errors that may result from this process are unintentional.  Patient: Mary Mosley Type: Established DOB: 06/24/1941 MRN: [id number] PCP: Gasper Nancyann BRAVO, MD  Service: Procedure DOS: 08/12/2024 Setting: Ambulatory Location: Ambulatory outpatient facility Delivery: Face-to-face Provider: Wallie Sherry, MD Specialty: Interventional Pain Management Specialty designation: 09 Location: Outpatient facility Ref. Prov.: Gasper Nancyann BRAVO, MD    Procedure:           Type: Lumbar Facet, Medial Branch Block(s) #2  Laterality: Bilateral  Level: L3, L4, L5, Medial Branch Level(s).  Imaging: Fluoroscopic guidance         Anesthesia: Local anesthesia (1-2% Lidocaine ) DOS: 08/12/2024 Performed by: Wallie Sherry, MD  Primary Purpose: Diagnostic/Therapeutic Indications: Low back pain severe enough to impact quality of life or function. 1. Lumbar facet arthropathy   2. Lumbar spondylosis   3. Chronic pain syndrome    NAS-11 Pain score:   Pre-procedure: 4 /10   Post-procedure: 4 /10     Position / Prep / Materials:  Position: Prone  Prep solution: DuraPrep (Iodine Povacrylex [0.7% available iodine] and Isopropyl Alcohol, 74% w/w) Area Prepped: Posterolateral Lumbosacral Spine (Wide prep: From the lower border of the scapula down to the end of the tailbone and from flank to flank.)  Materials:  Tray: Block Needle(s):  Type: Spinal  Gauge (G): 22  Length: 5-in Qty: 2     Pre-op H&P Assessment:  Mary Mosley is a 83 y.o. (year old), female patient, seen today for interventional treatment. She  has a past surgical history that includes Eye surgery (Bilateral); Tonsillectomy and adenoidectomy;  Cataract extraction w/PHACO (Right, 06/07/2021); Cataract extraction w/PHACO (Left, 06/21/2021); and Hip Arthroplasty (Right, 11/09/2023). Mary Mosley has a current medication list which includes the following prescription(s): bisacodyl , cholecalciferol, diphenhydramine , multiple vitamins-minerals, naproxen sodium, trazodone , and risedronate . Her primarily concern today is the Back Pain (low)  Initial Vital Signs:  Pulse/HCG Rate: 76ECG Heart Rate: 72 Temp:  (!) 97.3 F (36.3 C) Resp: 16 BP:  (!) 176/81 SpO2: 100 %  BMI: Estimated body mass index is 18.4 kg/m as calculated from the following:   Height as of this encounter: 5' 6 (1.676 m).   Weight as of this encounter: 114 lb (51.7 kg).  Risk Assessment: Allergies: Reviewed. She is allergic to amlodipine , diltiazem  hcl, fosamax  [alendronate  sodium], gabapentin , hydrochlorothiazide , penicillins, and actonel  [risedronate ].  Allergy Precautions: None required Coagulopathies: Reviewed. None identified.  Blood-thinner therapy: None at this time Active Infection(s): Reviewed. None identified. Mary Mosley is afebrile  Site Confirmation: Mary Mosley was asked to confirm the procedure and laterality before marking the site Procedure checklist: Completed Consent: Before the procedure and under the influence of no sedative(s), amnesic(s), or anxiolytics, the patient was informed of the treatment options, risks and possible complications. To fulfill our ethical and legal obligations, as recommended by the American Medical Association's Code of Ethics, I have informed the patient of my clinical impression; the nature and purpose of the treatment or procedure; the risks, benefits, and possible complications of the intervention; the alternatives, including doing nothing; the risk(s) and benefit(s) of the alternative treatment(s) or procedure(s); and the risk(s) and benefit(s) of doing nothing. The patient was provided information about the general risks and  possible complications associated with the procedure. These may include, but are not limited  to: failure to achieve desired goals, infection, bleeding, organ or nerve damage, allergic reactions, paralysis, and death. In addition, the patient was informed of those risks and complications associated to Spine-related procedures, such as failure to decrease pain; infection (i.e.: Meningitis, epidural or intraspinal abscess); bleeding (i.e.: epidural hematoma, subarachnoid hemorrhage, or any other type of intraspinal or peri-dural bleeding); organ or nerve damage (i.e.: Any type of peripheral nerve, nerve root, or spinal cord injury) with subsequent damage to sensory, motor, and/or autonomic systems, resulting in permanent pain, numbness, and/or weakness of one or several areas of the body; allergic reactions; (i.e.: anaphylactic reaction); and/or death. Furthermore, the patient was informed of those risks and complications associated with the medications. These include, but are not limited to: allergic reactions (i.e.: anaphylactic or anaphylactoid reaction(s)); adrenal axis suppression; blood sugar elevation that in diabetics may result in ketoacidosis or comma; water retention that in patients with history of congestive heart failure may result in shortness of breath, pulmonary edema, and decompensation with resultant heart failure; weight gain; swelling or edema; medication-induced neural toxicity; particulate matter embolism and blood vessel occlusion with resultant organ, and/or nervous system infarction; and/or aseptic necrosis of one or more joints. Finally, the patient was informed that Medicine is not an exact science; therefore, there is also the possibility of unforeseen or unpredictable risks and/or possible complications that may result in a catastrophic outcome. The patient indicated having understood very clearly. We have given the patient no guarantees and we have made no promises. Enough time was  given to the patient to ask questions, all of which were answered to the patient's satisfaction. Mary Mosley has indicated that she wanted to continue with the procedure. Attestation: I, the ordering provider, attest that I have discussed with the patient the benefits, risks, side-effects, alternatives, likelihood of achieving goals, and potential problems during recovery for the procedure that I have provided informed consent. Date  Time: 08/12/2024  9:29 AM  Pre-Procedure Preparation:  Monitoring: As per clinic protocol. Respiration, ETCO2, SpO2, BP, heart rate and rhythm monitor placed and checked for adequate function Safety Precautions: Patient was assessed for positional comfort and pressure points before starting the procedure. Time-out: I initiated and conducted the Time-out before starting the procedure, as per protocol. The patient was asked to participate by confirming the accuracy of the Time Out information. Verification of the correct person, site, and procedure were performed and confirmed by me, the nursing staff, and the patient. Time-out conducted as per Joint Commission's Universal Protocol (UP.01.01.01). Time: 1105  Description of Procedure:          Laterality: Bilateral. The procedure was performed in identical fashion on both sides. Targeted Levels:  L3, L4, L5, Medial Branch Level(s)  Safety Precautions: Aspiration looking for blood return was conducted prior to all injections. At no point did we inject any substances, as a needle was being advanced. Before injecting, the patient was told to immediately notify me if she was experiencing any new onset of ringing in the ears, or metallic taste in the mouth. No attempts were made at seeking any paresthesias. Safe injection practices and needle disposal techniques used. Medications properly checked for expiration dates. SDV (single dose vial) medications used. After the completion of the procedure, all disposable equipment  used was discarded in the proper designated medical waste containers. Local Anesthesia: Protocol guidelines were followed. The patient was positioned over the fluoroscopy table. The area was prepped in the usual manner. The time-out was completed. The target area was  identified using fluoroscopy. A 12-in long, straight, sterile hemostat was used with fluoroscopic guidance to locate the targets for each level blocked. Once located, the skin was marked with an approved surgical skin marker. Once all sites were marked, the skin (epidermis, dermis, and hypodermis), as well as deeper tissues (fat, connective tissue and muscle) were infiltrated with a small amount of a short-acting local anesthetic, loaded on a 10cc syringe with a 25G, 1.5-in  Needle. An appropriate amount of time was allowed for local anesthetics to take effect before proceeding to the next step. Local Anesthetic: Lidocaine  2.0% The unused portion of the local anesthetic was discarded in the proper designated containers. Technical description of process:   L3 Medial Branch Nerve Block (MBB): The target area for the L3 medial branch is at the junction of the postero-lateral aspect of the superior articular process and the superior, posterior, and medial edge of the transverse process of L4. Under fluoroscopic guidance, a Quincke needle was inserted until contact was made with os over the superior postero-lateral aspect of the pedicular shadow (target area). After negative aspiration for blood, 2 mL of the nerve block solution was injected without difficulty or complication. The needle was removed intact. L4 Medial Branch Nerve Block (MBB): The target area for the L4 medial branch is at the junction of the postero-lateral aspect of the superior articular process and the superior, posterior, and medial edge of the transverse process of L5. Under fluoroscopic guidance, a Quincke needle was inserted until contact was made with os over the superior  postero-lateral aspect of the pedicular shadow (target area). After negative aspiration for blood, 2 mL  of the nerve block solution was injected without difficulty or complication. The needle was removed intact. L5 Medial Branch Nerve Block (MBB): The target area for the L5 medial branch is at the junction of the postero-lateral aspect of the superior articular process and the superior, posterior, and medial edge of the sacral ala. Under fluoroscopic guidance, a Quincke needle was inserted until contact was made with os over the superior postero-lateral aspect of the pedicular shadow (target area). After negative aspiration for blood, 2 mL  of the nerve block solution was injected without difficulty or complication. The needle was removed intact.   12 cc solution made of 10 cc of 0.2% ropivacaine , 2 cc of Decadron  10 mg/cc.  2 cc injected at each level above bilaterally.    Once the entire procedure was completed, the treated area was cleaned, making sure to leave some of the prepping solution back to take advantage of its long term bactericidal properties.         Illustration of the posterior view of the lumbar spine and the posterior neural structures. Laminae of L2 through S1 are labeled. DPRL5, dorsal primary ramus of L5; DPRS1, dorsal primary ramus of S1; DPR3, dorsal primary ramus of L3; FJ, facet (zygapophyseal) joint L3-L4; I, inferior articular process of L4; LB1, lateral branch of dorsal primary ramus of L1; IAB, inferior articular branches from L3 medial branch (supplies L4-L5 facet joint); IBP, intermediate branch plexus; MB3, medial branch of dorsal primary ramus of L3; NR3, third lumbar nerve root; S, superior articular process of L5; SAB, superior articular branches from L4 (supplies L4-5 facet joint also); TP3, transverse process of L3.  Vitals:   08/12/24 0946 08/12/24 1057 08/12/24 1105 08/12/24 1112  BP: (!) 176/81 (!) 158/79 (!) 163/82 (!) 158/81  Pulse: 76     Resp: 16 16 15   16  Temp: (!) 97.3 F (36.3 C)     SpO2: 100% 99% 99% 99%  Weight: 114 lb (51.7 kg)     Height: 5' 6 (1.676 m)        Start Time: 1105 hrs. End Time: 1110 hrs.  Imaging Guidance (Spinal):          Type of Imaging Technique: Fluoroscopy Guidance (Spinal) Indication(s): Assistance in needle guidance and placement for procedures requiring needle placement in or near specific anatomical locations not easily accessible without such assistance. Exposure Time: Please see nurses notes. Contrast: None used. Fluoroscopic Guidance: I was personally present during the use of fluoroscopy. Tunnel Vision Technique used to obtain the best possible view of the target area. Parallax error corrected before commencing the procedure. Direction-depth-direction technique used to introduce the needle under continuous pulsed fluoroscopy. Once target was reached, antero-posterior, oblique, and lateral fluoroscopic projection used confirm needle placement in all planes. Images permanently stored in EMR. Interpretation: No contrast injected. I personally interpreted the imaging intraoperatively. Adequate needle placement confirmed in multiple planes. Permanent images saved into the patient's record.  Antibiotic Prophylaxis:   Anti-infectives (From admission, onward)    None      Indication(s): None identified  Post-operative Assessment:  Post-procedure Vital Signs:  Pulse/HCG Rate: 7674 Temp:  (!) 97.3 F (36.3 C) Resp: 16 BP:  (!) 158/81 SpO2: 99 %  EBL: None  Complications: No immediate post-treatment complications observed by team, or reported by patient.  Note: The patient tolerated the entire procedure well. A repeat set of vitals were taken after the procedure and the patient was kept under observation following institutional policy, for this type of procedure. Post-procedural neurological assessment was performed, showing return to baseline, prior to discharge. The patient was provided  with post-procedure discharge instructions, including a section on how to identify potential problems. Should any problems arise concerning this procedure, the patient was given instructions to immediately contact us , at any time, without hesitation. In any case, we plan to contact the patient by telephone for a follow-up status report regarding this interventional procedure.  Comments:  No additional relevant information.  Plan of Care  Orders:  Orders Placed This Encounter  Procedures   DG PAIN [facility] C-ARM 1-60 MIN NO REPORT    Intraoperative interpretation by procedural physician at [facility].    Standing Status:   Standing    Number of Occurrences:   1    Reason for exam::   Assistance in needle guidance and placement for procedures requiring needle placement in or near specific anatomical locations not easily accessible without such assistance.     Medications ordered for procedure: Meds ordered this encounter  Medications   lidocaine  (XYLOCAINE ) 2 % (with pres) injection 400 mg   ropivacaine  (PF) 2 mg/mL (0.2%) (NAROPIN ) injection 18 mL   dexamethasone  (DECADRON ) injection 20 mg   Medications administered: We administered lidocaine , ropivacaine  (PF) 2 mg/mL (0.2%), and dexamethasone .  See the medical record for exact dosing, route, and time of administration.  Follow-up plan:   Return in about 3 weeks (around 09/02/2024) for PPE, F2F, discuss RFA.       B/L L3,4,5 MBNB #1 07/13/22, #2 08/12/24    Recent Visits Date Type Provider Dept  07/18/24 Office Visit Marcelino Nurse, MD [facility]  06/12/24 Procedure visit Marcelino Nurse, MD [facility]  05/23/24 Office Visit Marcelino Nurse, MD [facility]  Showing recent visits within past 90 days and meeting all other requirements Today's Visits  Date Type Provider Dept  08/12/24 Procedure visit Marcelino Nurse, MD [facility]  Showing today's visits and meeting all other  requirements Future Appointments Date Type Provider Dept  09/03/24 Appointment Marcelino Nurse, MD [facility]  Showing future appointments within next 90 days and meeting all other requirements  Disposition: Discharge home  Discharge (Date  Time): 08/12/2024;   hrs.   Primary Care Physician: Gasper Nancyann BRAVO, MD Location: [facility] Note by: Nurse Marcelino, MD Date: 08/12/2024; Time: 11:27 AM  Disclaimer:  Medicine is not an exact science. The only guarantee in medicine is that nothing is guaranteed. It is important to note that the decision to proceed with this intervention was based on the information collected from the patient. The Data and conclusions were drawn from the patient's questionnaire, the interview, and the physical examination. Because the information was provided in large part by the patient, it cannot be guaranteed that it has not been purposely or unconsciously manipulated. Every effort has been made to obtain as much relevant data as possible for this evaluation. It is important to note that the conclusions that lead to this procedure are derived in large part from the available data. Always take into account that the treatment will also be dependent on availability of resources and existing treatment guidelines, considered by other Pain Management Practitioners as being common knowledge and practice, at the time of the intervention. For Medico-Legal purposes, it is also important to point out that variation in procedural techniques and pharmacological choices are the acceptable norm. The indications, contraindications, technique, and results of the above procedure should only be interpreted and judged by a Board-Certified Interventional Pain Specialist with extensive familiarity and expertise in the same exact procedure and technique.

## 2024-08-12 NOTE — Progress Notes (Deleted)
 PROVIDER NOTE: Interpretation of information contained herein should be left to medically-trained personnel. Specific patient instructions are provided elsewhere under Patient Instructions section of medical record. This document was created in part using STT-dictation technology, any transcriptional errors that may result from this process are unintentional.  Patient: Mary Mosley Type: Established DOB: 04-Jan-1941 MRN: [id number] PCP: Gasper Nancyann BRAVO, MD  Service: Procedure DOS: 08/12/2024 Setting: Ambulatory Location: Ambulatory outpatient facility Delivery: Face-to-face Provider: Wallie Sherry, MD Specialty: Interventional Pain Management Specialty designation: 09 Location: Outpatient facility Ref. Prov.: Gasper Nancyann BRAVO, MD       Interventional Therapy   Type: Lumbar Facet, Medial Branch Block(s) (w/ fluoroscopic mapping) #1  Laterality: Bilateral  Level: L3, L4, and L5 Medial Branch/Dorsal Rami Level(s). Injecting these levels blocks the L3-4 and L4-5 lumbar facet joints. Imaging: Fluoroscopic guidance Spinal ([id number]) Anesthesia: Local anesthesia (1-2% Lidocaine ) Anxiolysis:    ***       ***            Sedation:                         DOS: 08/12/2024 Performed by: Wallie Sherry, MD  Primary Purpose: Diagnostic/Therapeutic Indications: Low back pain severe enough to impact quality of life or function. 1. Lumbar facet arthropathy   2. Lumbar spondylosis   3. Chronic pain syndrome    NAS-11 Pain score:   Pre-procedure: 4 /10   Post-procedure: 4 /10     Position / Prep / Materials:  Position: Prone  Prep solution: ChloraPrep (2% chlorhexidine  gluconate and 70% isopropyl alcohol) Area Prepped: Posterolateral Lumbosacral Spine (Wide prep: From the lower border of the scapula down to the end of the tailbone and from flank to flank.)  Materials:  Tray: Block Needle(s):  Type: Spinal  Gauge (G): 22  Length: 5-in Qty:    ***     H&P (Pre-op Assessment):  Mary Mosley  is a 83 y.o. (year old), female patient, seen today for interventional treatment. She  has a past surgical history that includes Eye surgery (Bilateral); Tonsillectomy and adenoidectomy; Cataract extraction w/PHACO (Right, 06/07/2021); Cataract extraction w/PHACO (Left, 06/21/2021); and Hip Arthroplasty (Right, 11/09/2023). [name] has a current medication list which includes the following prescription(s): bisacodyl , cholecalciferol, diphenhydramine , multiple vitamins-minerals, naproxen sodium, trazodone , and risedronate . Her primarily concern today is the Back Pain (low)  Initial Vital Signs:  Pulse/HCG Rate: 76  Temp: (!) 97.3 F (36.3 C) Resp: 16 BP: (!) 176/81 SpO2: 100 %  BMI: Estimated body mass index is 18.4 kg/m as calculated from the following:   Height as of this encounter: 5' 6 (1.676 m).   Weight as of this encounter: 114 lb (51.7 kg).  Risk Assessment: Allergies: Reviewed. She is allergic to amlodipine , diltiazem  hcl, fosamax  [alendronate  sodium], gabapentin , hydrochlorothiazide , penicillins, and actonel  [risedronate ].  Allergy Precautions: None required Coagulopathies: Reviewed. None identified.  Blood-thinner therapy: None at this time Active Infection(s): Reviewed. None identified. Mary Mosley is afebrile  Site Confirmation: Mary Mosley was asked to confirm the procedure and laterality before marking the site Procedure checklist: Completed Consent: Before the procedure and under the influence of no sedative(s), amnesic(s), or anxiolytics, the patient was informed of the treatment options, risks and possible complications. To fulfill our ethical and legal obligations, as recommended by the American Medical Association's Code of Ethics, I have informed the patient of my clinical impression; the nature and purpose of the treatment or procedure; the risks, benefits, and possible complications of  the intervention; the alternatives, including doing nothing; the risk(s) and benefit(s)  of the alternative treatment(s) or procedure(s); and the risk(s) and benefit(s) of doing nothing. The patient was provided information about the general risks and possible complications associated with the procedure. These may include, but are not limited to: failure to achieve desired goals, infection, bleeding, organ or nerve damage, allergic reactions, paralysis, and death. In addition, the patient was informed of those risks and complications associated to Spine-related procedures, such as failure to decrease pain; infection (i.e.: Meningitis, epidural or intraspinal abscess); bleeding (i.e.: epidural hematoma, subarachnoid hemorrhage, or any other type of intraspinal or peri-dural bleeding); organ or nerve damage (i.e.: Any type of peripheral nerve, nerve root, or spinal cord injury) with subsequent damage to sensory, motor, and/or autonomic systems, resulting in permanent pain, numbness, and/or weakness of one or several areas of the body; allergic reactions; (i.e.: anaphylactic reaction); and/or death. Furthermore, the patient was informed of those risks and complications associated with the medications. These include, but are not limited to: allergic reactions (i.e.: anaphylactic or anaphylactoid reaction(s)); adrenal axis suppression; blood sugar elevation that in diabetics may result in ketoacidosis or comma; water retention that in patients with history of congestive heart failure may result in shortness of breath, pulmonary edema, and decompensation with resultant heart failure; weight gain; swelling or edema; medication-induced neural toxicity; particulate matter embolism and blood vessel occlusion with resultant organ, and/or nervous system infarction; and/or aseptic necrosis of one or more joints. Finally, the patient was informed that Medicine is not an exact science; therefore, there is also the possibility of unforeseen or unpredictable risks and/or possible complications that may result in a  catastrophic outcome. The patient indicated having understood very clearly. We have given the patient no guarantees and we have made no promises. Enough time was given to the patient to ask questions, all of which were answered to the patient's satisfaction. Ms. Blasko has indicated that she wanted to continue with the procedure. Attestation: I, the ordering provider, attest that I have discussed with the patient the benefits, risks, side-effects, alternatives, likelihood of achieving goals, and potential problems during recovery for the procedure that I have provided informed consent. Date  Time: {CHL [facility]-PAIN TIME CHOICES:21018001}  Pre-Procedure Preparation:  Monitoring: As per clinic protocol. Respiration, ETCO2, SpO2, BP, heart rate and rhythm monitor placed and checked for adequate function Safety Precautions: Patient was assessed for positional comfort and pressure points before starting the procedure. Time-out: I initiated and conducted the Time-out before starting the procedure, as per protocol. The patient was asked to participate by confirming the accuracy of the Time Out information. Verification of the correct person, site, and procedure were performed and confirmed by me, the nursing staff, and the patient. Time-out conducted as per Joint Commission's Universal Protocol (UP.01.01.01). Time:   Start Time:   hrs.  Description of Procedure:          Laterality: (see above) Targeted Levels: (see above)  Safety Precautions: Aspiration looking for blood return was conducted prior to all injections. At no point did we inject any substances, as a needle was being advanced. Before injecting, the patient was told to immediately notify me if she was experiencing any new onset of ringing in the ears, or metallic taste in the mouth. No attempts were made at seeking any paresthesias. Safe injection practices and needle disposal techniques used. Medications properly checked for expiration  dates. SDV (single dose vial) medications used. After the completion of the procedure, all disposable  equipment used was discarded in the proper designated medical waste containers. Local Anesthesia: Protocol guidelines were followed. The patient was positioned over the fluoroscopy table. The area was prepped in the usual manner. The time-out was completed. The target area was identified using fluoroscopy. A 12-in long, straight, sterile hemostat was used with fluoroscopic guidance to locate the targets for each level blocked. Once located, the skin was marked with an approved surgical skin marker. Once all sites were marked, the skin (epidermis, dermis, and hypodermis), as well as deeper tissues (fat, connective tissue and muscle) were infiltrated with a small amount of a short-acting local anesthetic, loaded on a 10cc syringe with a 25G, 1.5-in  Needle. An appropriate amount of time was allowed for local anesthetics to take effect before proceeding to the next step. Local Anesthetic: Lidocaine  2.0% The unused portion of the local anesthetic was discarded in the proper designated containers. Technical description of process:  Medial Branch  Dorsal Rami Nerve Block (MBB):  Neuroanatomy note: Each lumbar facet joint receives dual innervation from medial branches arising from the posterior primary rami at the same level and one level above. The target for each lumbar medial branch is the junction of the ipsilateral superior articular and transverse process of the lower vertebral body. (i.e.: The L4-L5 facet joint is innervated by the L4 medial branch [located at L5] and the L3 medial branch [located at L4]. Blocking the L4 Medial Branch is therefore achieved by injecting at the junction of the ipsilateral superior articular and transverse process of the lower vertebral body [L5].).  Exception: The exception to the above rule is the L5-S1 facet joint which has triple innervation requiring the L4 medial branch,  as well as the L5 and the S1 Dorsal Rami(s) to be blocked to fully denervate the joint.  Under fluoroscopic guidance, a needle was inserted until contact was made with os over the target area. After negative aspiration, 0.5 mL of the nerve block solution was injected without difficulty or complication. Paresthesia were avoided during injection. The needle(s) were removed intact and without complication.  Once the entire procedure was completed, the treated area was cleaned, making sure to leave some of the prepping solution back to take advantage of its long term bactericidal properties.         Illustration of the posterior view of the lumbar spine and the posterior neural structures. Laminae of L2 through S1 are labeled. DPRL5, dorsal primary ramus of L5; DPRS1, dorsal primary ramus of S1; DPR3, dorsal primary ramus of L3; FJ, facet (zygapophyseal) joint L3-L4; I, inferior articular process of L4; LB1, lateral branch of dorsal primary ramus of L1; IAB, inferior articular branches from L3 medial branch (supplies L4-L5 facet joint); IBP, intermediate branch plexus; MB3, medial branch of dorsal primary ramus of L3; NR3, third lumbar nerve root; S, superior articular process of L5; SAB, superior articular branches from L4 (supplies L4-5 facet joint also); TP3, transverse process of L3.   Facet Joint Innervation (* possible contribution)  L1-2 T12, L1 (L2*)  Medial Branch  L2-3 L1, L2 (L3*)                     L3-4 L2, L3 (L4*)                     L4-5 L3, L4 (L5*)                     L5-S1 L4, L5, S1  Vitals:   08/12/24 0946  BP: (!) 176/81  Pulse: 76  Resp: 16  Temp: (!) 97.3 F (36.3 C)  SpO2: 100%  Weight: 114 lb (51.7 kg)  Height: 5' 6 (1.676 m)     End Time:   hrs.  Imaging Guidance (Spinal):         Type of Imaging Technique: Fluoroscopy Guidance (Spinal) Indication(s): Fluoroscopy guidance for needle placement to enhance accuracy in procedures  requiring precise needle localization for targeted delivery of medication in or near specific anatomical locations not easily accessible without such real-time imaging assistance. Exposure Time: Please see nurses notes. Contrast: None used. Fluoroscopic Guidance: I was personally present during the use of fluoroscopy. Tunnel Vision Technique used to obtain the best possible view of the target area. Parallax error corrected before commencing the procedure. Direction-depth-direction technique used to introduce the needle under continuous pulsed fluoroscopy. Once target was reached, antero-posterior, oblique, and lateral fluoroscopic projection used confirm needle placement in all planes. Images permanently stored in EMR. Interpretation: No contrast injected. I personally interpreted the imaging intraoperatively. Adequate needle placement confirmed in multiple planes. Permanent images saved into the patient's record.  Post-operative Assessment:  Post-procedure Vital Signs:  Pulse/HCG Rate: 76  Temp: (!) 97.3 F (36.3 C) Resp: 16 BP: (!) 176/81 SpO2: 100 %  EBL: None  Complications: No immediate post-treatment complications observed by team, or reported by patient.  Note: The patient tolerated the entire procedure well. A repeat set of vitals were taken after the procedure and the patient was kept under observation following institutional policy, for this type of procedure. Post-procedural neurological assessment was performed, showing return to baseline, prior to discharge. The patient was provided with post-procedure discharge instructions, including a section on how to identify potential problems. Should any problems arise concerning this procedure, the patient was given instructions to immediately contact us , at any time, without hesitation. In any case, we plan to contact the patient by telephone for a follow-up status report regarding this interventional procedure.  Comments:  No  additional relevant information.  Plan of Care (POC)  Orders:  No orders of the defined types were placed in this encounter.   {There is no content from the last Subjective section.}   Medications ordered for procedure: No orders of the defined types were placed in this encounter.  Medications administered: Dianca L. Thebeau had no medications administered during this visit.  See the medical record for exact dosing, route, and time of administration.    B/L L3,4,5 MBNB #1 07/13/22, B/L T12/L1 08/29/22, B/L T11, T12, L1 MBNB 06/12/24      Follow-up plan:   No follow-ups on file.     Recent Visits Date Type Provider Dept  07/18/24 Office Visit Marcelino Nurse, MD [facility]  06/12/24 Procedure visit Marcelino Nurse, MD [facility]  05/23/24 Office Visit Marcelino Nurse, MD [facility]  Showing recent visits within past 90 days and meeting all other requirements Today's Visits Date Type Provider Dept  08/12/24 Procedure visit Marcelino Nurse, MD [facility]  Showing today's visits and meeting all other requirements Future Appointments No visits were found meeting these conditions. Showing future appointments within next 90 days and meeting all other requirements   Disposition: Discharge home  Discharge (Date  Time): 08/12/2024;   hrs.   Primary Care Physician: Gasper Nancyann BRAVO, MD Location: [facility] Note by: Nurse Marcelino, MD (TTS technology used. I apologize for any typographical errors that were not  detected and corrected.) Date: 08/12/2024; Time: 10:35 AM  Disclaimer:  Medicine is not an Visual merchandiser. The only guarantee in medicine is that nothing is guaranteed. It is important to note that the decision to proceed with this intervention was based on the information collected from the patient. The Data and conclusions were drawn from the patient's questionnaire, the interview, and the physical examination.  Because the information was provided in large part by the patient, it cannot be guaranteed that it has not been purposely or unconsciously manipulated. Every effort has been made to obtain as much relevant data as possible for this evaluation. It is important to note that the conclusions that lead to this procedure are derived in large part from the available data. Always take into account that the treatment will also be dependent on availability of resources and existing treatment guidelines, considered by other Pain Management Practitioners as being common knowledge and practice, at the time of the intervention. For Medico-Legal purposes, it is also important to point out that variation in procedural techniques and pharmacological choices are the acceptable norm. The indications, contraindications, technique, and results of the above procedure should only be interpreted and judged by a Board-Certified Interventional Pain Specialist with extensive familiarity and expertise in the same exact procedure and technique.

## 2024-08-12 NOTE — Progress Notes (Signed)
 Safety precautions to be maintained throughout the outpatient stay will include: orient to surroundings, keep bed in low position, maintain call bell within reach at all times, provide assistance with transfer out of bed and ambulation.

## 2024-08-12 NOTE — Patient Instructions (Signed)
 ____________________________________________________________________________________________  G ______________________________________________________________________    Post-Procedure Discharge Instructions  Instructions: Apply ice:  Purpose: This will minimize any swelling and discomfort after procedure.  When: Day of procedure, as soon as you get home. How: Fill a plastic sandwich bag with crushed ice. Cover it with a small towel and apply to injection site. How long: (15 min on, 15 min off) Apply for 15 minutes then remove x 15 minutes.  Repeat sequence on day of procedure, until you go to bed. Apply heat:  Purpose: To treat any soreness and discomfort from the procedure. When: Starting the next day after the procedure. How: Apply heat to procedure site starting the day following the procedure. How long: May continue to repeat daily, until discomfort goes away. Food intake: Start with clear liquids (like water) and advance to regular food, as tolerated.  Physical activities: Keep activities to a minimum for the first 8 hours after the procedure. After that, then as tolerated. Driving: If you have received any sedation, be responsible and do not drive. You are not allowed to drive for 24 hours after having sedation. Blood thinner: (Applies only to those taking blood thinners) You may restart your blood thinner 6 hours after your procedure. Insulin: (Applies only to Diabetic patients taking insulin) As soon as you can eat, you may resume your normal dosing schedule. Infection prevention: Keep procedure site clean and dry. Shower daily and clean area with soap and water. Post-procedure Pain Diary: Extremely important that this be done correctly and accurately. Recorded information will be used to determine the next step in treatment. For the purpose of accuracy, follow these rules: Evaluate only the area treated. Do not report or include pain from an untreated area. For the purpose of this  evaluation, ignore all other areas of pain, except for the treated area. After your procedure, avoid taking a long nap and attempting to complete the pain diary after you wake up. Instead, set your alarm clock to go off every hour, on the hour, for the initial 8 hours after the procedure. Document the duration of the numbing medicine, and the relief you are getting from it. Do not go to sleep and attempt to complete it later. It will not be accurate. If you received sedation, it is likely that you were given a medication that may cause amnesia. Because of this, completing the diary at a later time may cause the information to be inaccurate. This information is needed to plan your care. Follow-up appointment: Keep your post-procedure follow-up evaluation appointment after the procedure (usually 2 weeks for most procedures, 6 weeks for radiofrequencies). DO NOT FORGET to bring you pain diary with you.   Expect: (What should I expect to see with my procedure?) From numbing medicine (AKA: Local Anesthetics): Numbness or decrease in pain. You may also experience some weakness, which if present, could last for the duration of the local anesthetic. Onset: Full effect within 15 minutes of injected. Duration: It will depend on the type of local anesthetic used. On the average, 1 to 8 hours.  From steroids (Applies only if steroids were used): Decrease in swelling or inflammation. Once inflammation is improved, relief of the pain will follow. Onset of benefits: Depends on the amount of swelling present. The more swelling, the longer it will take for the benefits to be seen. In some cases, up to 10 days. Duration: Steroids will stay in the system x 2 weeks. Duration of benefits will depend on multiple posibilities including persistent irritating  factors. Side-effects: If present, they may typically last 2 weeks (the duration of the steroids). Frequent: Cramps (if they occur, drink Gatorade and take  over-the-counter Magnesium 450-500 mg once to twice a day); water retention with temporary weight gain; increases in blood sugar; decreased immune system response; increased appetite. Occasional: Facial flushing (red, warm cheeks); mood swings; menstrual changes. Uncommon: Long-term decrease or suppression of natural hormones; bone thinning. (These are more common with higher doses or more frequent use. This is why we prefer that our patients avoid having any injection therapies in other practices.)  Very Rare: Severe mood changes; psychosis; aseptic necrosis. From procedure: Some discomfort is to be expected once the numbing medicine wears off. This should be minimal if ice and heat are applied as instructed.  Call if: (When should I call?) You experience numbness and weakness that gets worse with time, as opposed to wearing off. New onset bowel or bladder incontinence. (Applies only to procedures done in the spine)  Emergency Numbers: Durning business hours (Monday - Thursday, 8:00 AM - 4:00 PM) (Friday, 9:00 AM - 12:00 Noon): [phone] After hours: [phone] NOTE: If you are having a problem and are unable connect with, or to talk to a provider, then go to your nearest urgent care or emergency department. If the problem is serious and urgent, please call 911. ______________________________________________________________________

## 2024-08-13 ENCOUNTER — Telehealth: Payer: Self-pay

## 2024-08-13 NOTE — Telephone Encounter (Signed)
 Post procedure follow up.  Patient states she is doiang great.

## 2024-09-03 ENCOUNTER — Encounter: Payer: Self-pay | Admitting: Student in an Organized Health Care Education/Training Program

## 2024-09-03 ENCOUNTER — Ambulatory Visit
Attending: Student in an Organized Health Care Education/Training Program | Admitting: Student in an Organized Health Care Education/Training Program

## 2024-09-03 VITALS — BP 151/69 | HR 66 | Temp 97.0°F | Resp 18 | Ht 66.0 in | Wt 115.0 lb

## 2024-09-03 DIAGNOSIS — M47816 Spondylosis without myelopathy or radiculopathy, lumbar region: Secondary | ICD-10-CM | POA: Insufficient documentation

## 2024-09-03 DIAGNOSIS — G894 Chronic pain syndrome: Secondary | ICD-10-CM | POA: Diagnosis not present

## 2024-09-03 NOTE — Progress Notes (Signed)
 Safety precautions to be maintained throughout the outpatient stay will include: orient to surroundings, keep bed in low position, maintain call bell within reach at all times, provide assistance with transfer out of bed and ambulation.

## 2024-09-03 NOTE — Progress Notes (Signed)
 PROVIDER NOTE: Interpretation of information contained herein should be left to medically-trained personnel. Specific patient instructions are provided elsewhere under Patient Instructions section of medical record. This document was created in part using AI and STT-dictation technology, any transcriptional errors that may result from this process are unintentional.  Patient: Mary Mosley  Service: E/M   PCP: Gasper Nancyann BRAVO, MD  DOB: 30-Sep-1941  DOS: 09/03/2024  Provider: Wallie Sherry, MD  MRN: [id number]  Delivery: Face-to-face  Specialty: Interventional Pain Management  Type: Established Patient  Setting: Ambulatory outpatient facility  Specialty designation: 09  Referring Prov.: Gasper Nancyann BRAVO, MD  Location: Outpatient office facility       History of present illness (HPI) [name], a 83 y.o. year old female, is here today because of her Lumbar facet arthropathy [M47.816]. [name]'s primary complain today is Back Pain (Lower Back )  Pertinent problems: [name] has Spinal stenosis, lumbar region, with neurogenic claudication; Lumbar degenerative disc disease; Lumbar spondylosis; Chronic bilateral thoracic back pain; Chronic pain syndrome; and Thoracic facet joint syndrome on their pertinent problem list.  Pain Assessment: Severity of Chronic pain is reported as a 0-No pain/10. Location: Back Lower/Denies. Onset: More than a month ago. Quality:  . Timing: Other (Comment) (No pain since procedure). Modifying factor(s): Procedure. Vitals:  height is 5' 6 (1.676 m) and weight is 115 lb (52.2 kg). Her temporal temperature is 97 F (36.1 C) (abnormal). Her blood pressure is 151/69 (abnormal) and her pulse is 66. Her respiration is 18 and oxygen saturation is 100%.  BMI: Estimated body mass index is 18.56 kg/m as calculated from the following:   Height as of this encounter: 5' 6 (1.676 m).   Weight as of this encounter: 115 lb (52.2 kg).  Last encounter: 07/18/2024. Last  procedure: 08/12/2024.  Reason for encounter:   Post-Procedure Evaluation   Type: Lumbar Facet, Medial Branch Block(s) #2  Laterality: Bilateral  Level: L3, L4, L5, Medial Branch Level(s).  Imaging: Fluoroscopic guidance         Anesthesia: Local anesthesia (1-2% Lidocaine ) DOS: 08/12/2024 Performed by: Wallie Sherry, MD  Primary Purpose: Diagnostic/Therapeutic Indications: Low back pain severe enough to impact quality of life or function. 1. Lumbar facet arthropathy   2. Lumbar spondylosis   3. Chronic pain syndrome    NAS-11 Pain score:   Pre-procedure: 4 /10   Post-procedure: 4 /10     Effectiveness:  Initial hour after procedure: 100 %  Subsequent 4-6 hours post-procedure: 100 %  Analgesia past initial 6 hours: 100 %  Ongoing improvement:  Analgesic:  100% Function: Somewhat improved ROM: Somewhat improved   ROS  Constitutional: Denies any fever or chills Gastrointestinal: No reported hemesis, hematochezia, vomiting, or acute GI distress Musculoskeletal: Improvement in low back pain Neurological: No reported episodes of acute onset apraxia, aphasia, dysarthria, agnosia, amnesia, paralysis, loss of coordination, or loss of consciousness  Medication Review  Multiple Vitamins-Minerals, bisacodyl , cholecalciferol, diphenhydrAMINE , naproxen sodium, risedronate , and traZODone   History Review  Allergy: [name] is allergic to amlodipine , diltiazem  hcl, fosamax  [alendronate  sodium], gabapentin , hydrochlorothiazide , penicillins, and actonel  [risedronate ]. Drug: [name]  reports no history of drug use. Alcohol:  reports no history of alcohol use. Tobacco:  reports that she has never smoked. She has never used smokeless tobacco. Social: [name]  reports that she has never smoked. She has never used smokeless tobacco. She reports that she does not drink alcohol and does not use drugs. Medical:  has a past  medical history of Hypertension, OSA (obstructive sleep apnea),  and Osteopenia. Surgical: [name]  has a past surgical history that includes Eye surgery (Bilateral); Tonsillectomy and adenoidectomy; Cataract extraction w/PHACO (Right, 06/07/2021); Cataract extraction w/PHACO (Left, 06/21/2021); and Hip Arthroplasty (Right, 11/09/2023). Family: family history includes Diabetes in her father; Heart Problems in her brother and brother; Heart attack in her mother; Liver cancer in her son; Lung cancer in her brother; Other (age of onset: 29) in her father.  Laboratory Chemistry Profile   Renal Lab Results  Component Value Date   BUN 18 11/10/2023   CREATININE 0.84 11/10/2023   BCR 15 09/13/2022   GFRAA 74 09/11/2020   GFRNONAA >60 11/10/2023    Hepatic Lab Results  Component Value Date   AST 29 11/09/2023   ALT 21 11/09/2023   ALBUMIN  4.7 11/09/2023   ALKPHOS 56 11/09/2023    Electrolytes Lab Results  Component Value Date   NA 136 11/10/2023   K 4.2 11/10/2023   CL 104 11/10/2023   CALCIUM 8.8 (L) 11/10/2023    Bone Lab Results  Component Value Date   VD25OH 56.4 09/17/2018    Inflammation (CRP: Acute Phase) (ESR: Chronic Phase) No results found for: CRP, ESRSEDRATE, LATICACIDVEN       Note: Above Lab results reviewed.  Recent Imaging Review  DG PAIN CLINIC C-ARM 1-60 MIN NO REPORT Fluoro was used, but no Radiologist interpretation will be provided.  Please refer to NOTES tab for provider progress note. Note: Reviewed        Physical Exam  Vitals: BP (!) 151/69 (BP Location: Right Arm, Patient Position: Sitting)   Pulse 66   Temp (!) 97 F (36.1 C) (Temporal)   Resp 18   Ht 5' 6 (1.676 m)   Wt 115 lb (52.2 kg)   SpO2 100%   BMI 18.56 kg/m  BMI: Estimated body mass index is 18.56 kg/m as calculated from the following:   Height as of this encounter: 5' 6 (1.676 m).   Weight as of this encounter: 115 lb (52.2 kg). Ideal: Ideal body weight: 59.3 kg (130 lb 11.7 oz) General appearance: Well nourished, well developed,  and well hydrated. In no apparent acute distress Mental status: Alert, oriented x 3 (person, place, & time)       Respiratory: No evidence of acute respiratory distress Eyes: PERLA   Assessment   Diagnosis  1. Lumbar facet arthropathy   2. Lumbar spondylosis   3. Chronic pain syndrome      Updated Problems: No problems updated.  Plan of Care  Assessment and Plan    Chronic low back pain related to lumbar facet arthropathy Chronic pain is well-controlled with two successful nerve blocks bilaterally at L3, L4, L5, resulting in no pain and improved mobility at home. The nerve blocks have provided long-term relief. Ablation may be considered in the future if pain recurs, although it may not be necessary given her positive response to nerve blocks. Insurance approval for lumbar radiofrequency ablation is available due to the two positive nerve blocks. Monitor symptoms and contact provider if pain returns. Consider repeating lumbar facet medial branch nerve block versus performing RFA when patient has recurrence of her pain.        B/L L3,4,5 MBNB #1 07/13/22, #2 08/12/24     Return for patient will call to schedule F2F appt prn.    Recent Visits Date Type Provider Dept  08/12/24 Procedure visit Marcelino Nurse, MD [facility]  07/18/24  Office Visit Marcelino Nurse, MD [facility]  06/12/24 Procedure visit Marcelino Nurse, MD [facility]  Showing recent visits within past 90 days and meeting all other requirements Today's Visits Date Type Provider Dept  09/03/24 Office Visit Marcelino Nurse, MD [facility]  Showing today's visits and meeting all other requirements Future Appointments No visits were found meeting these conditions. Showing future appointments within next 90 days and meeting all other requirements  I discussed the assessment and treatment plan with the patient. The patient was provided an opportunity to ask questions and all were  answered. The patient agreed with the plan and demonstrated an understanding of the instructions.  Patient advised to call back or seek an in-person evaluation if the symptoms or condition worsens.  I personally spent a total of 20 minutes in the care of the patient today including preparing to see the patient, getting/reviewing separately obtained history, performing a medically appropriate exam/evaluation, counseling and educating, placing orders, and documenting clinical information in the EHR.   Note by: Nurse Marcelino, MD (TTS and AI technology used. I apologize for any typographical errors that were not detected and corrected.) Date: 09/03/2024; Time: 2:21 PM

## 2024-09-23 ENCOUNTER — Encounter: Payer: Self-pay | Admitting: Radiology

## 2024-12-10 ENCOUNTER — Ambulatory Visit
Admission: EM | Admit: 2024-12-10 | Discharge: 2024-12-10 | Disposition: A | Discharge status: Home / Self Care | Attending: Emergency Medicine | Admitting: Emergency Medicine

## 2024-12-10 DIAGNOSIS — I10 Essential (primary) hypertension: Secondary | ICD-10-CM

## 2024-12-10 DIAGNOSIS — R051 Acute cough: Secondary | ICD-10-CM

## 2024-12-10 DIAGNOSIS — J069 Acute upper respiratory infection, unspecified: Secondary | ICD-10-CM | POA: Diagnosis not present

## 2024-12-10 MED ORDER — AZITHROMYCIN 250 MG PO TABS: 250.0000 mg | ORAL_TABLET | Freq: Every day | ORAL | 0 refills | Status: AC

## 2024-12-10 NOTE — Discharge Instructions (Addendum)
 Take the Zithromax  as directed for your upper respiratory infection.  Follow-up with your primary care provider.  Your blood pressure is elevated today at 185/102;repeat 171/81; repeated again 154/82.  Please have this rechecked by your primary care provider.

## 2024-12-10 NOTE — ED Triage Notes (Signed)
 Patient presents to UC for cough, congestion x 2 weeks. Treating with robitussin and mucinex with no improvement.  She is requesting a z-pak.  Denies SOB or fever.

## 2024-12-10 NOTE — ED Provider Notes (Signed)
 " [facility]    CSN: [id number] Arrival date & time: 12/10/24  1021      History   Chief Complaint Chief Complaint  Patient presents with   Cough    HPI Mary Mosley is a 84 y.o. female.  Patient presents with 2-week history of congestion and cough.  Treatment attempted with Robitussin and Mucinex without improvement.  No fever, chest pain, shortness of breath.  The history is provided by the patient and medical records.    Past Medical History:  Diagnosis Date   Hypertension    OSA (obstructive sleep apnea)    pt denies   Osteopenia     Patient Active Problem List   Diagnosis Date Noted   Malnutrition of moderate degree 11/11/2023   Fall at home, initial encounter 11/08/2023   Dizziness 11/08/2023   Femur fracture, right (HCC) 11/08/2023   Thoracic facet joint syndrome 08/10/2022   Spinal stenosis, lumbar region, with neurogenic claudication 04/05/2022   Lumbar degenerative disc disease 04/05/2022   Lumbar spondylosis 04/05/2022   Chronic bilateral thoracic back pain 04/05/2022   Chronic pain syndrome 04/05/2022   DDD (degenerative disc disease), cervical 02/07/2022   Thoracic degenerative disc disease 02/07/2022   Scoliosis 02/07/2022   Breast calcification, right 09/03/2015   Myalgia 09/03/2015   Osteoporosis 08/01/2011   Edema 03/12/2010   Insomnia 01/01/2009   OSA (obstructive sleep apnea) 07/24/2008    Past Surgical History:  Procedure Laterality Date   CATARACT EXTRACTION W/PHACO Right 06/07/2021   Procedure: CATARACT EXTRACTION PHACO AND INTRAOCULAR LENS PLACEMENT (IOC) RIGHT;  Surgeon: Myrna Adine Anes, MD;  Location: [facility];  Service: Ophthalmology;  Laterality: Right;  8.45 00:50.7   CATARACT EXTRACTION W/PHACO Left 06/21/2021   Procedure: CATARACT EXTRACTION PHACO AND INTRAOCULAR LENS PLACEMENT (IOC) LEFT 7.54 00:51.0;  Surgeon: Myrna Adine Anes, MD;  Location: [facility];  Service: Ophthalmology;  Laterality:  Left;   EYE SURGERY Bilateral    due to trauma   HIP ARTHROPLASTY Right 11/09/2023   Procedure: ARTHROPLASTY BIPOLAR HIP (HEMIARTHROPLASTY);  Surgeon: Onesimo Anes LABOR, MD;  Location: [facility];  Service: Orthopedics;  Laterality: Right;   TONSILLECTOMY AND ADENOIDECTOMY      OB History     Gravida  1   Para  1   Term      Preterm      AB      Living         SAB      IAB      Ectopic      Multiple      Live Births               Home Medications    Prior to Admission medications  Medication Sig Start Date End Date Taking? Authorizing Provider  azithromycin  (ZITHROMAX ) 250 MG tablet Take 1 tablet (250 mg total) by mouth daily. Take first 2 tablets together, then 1 every day until finished. 12/10/24  Yes Corlis Burnard DEL, NP  bisacodyl  (DULCOLAX) 5 MG EC tablet Take 5 mg by mouth daily as needed for moderate constipation.    [provider]  cholecalciferol (VITAMIN D ) 1000 UNITS tablet Take 1 tablet by mouth daily.    [provider]  diphenhydrAMINE  (BENADRYL ) 25 mg capsule Take 25 mg by mouth daily as needed.    [provider]  Multiple Vitamins-Minerals (CENTRUM SILVER PO) Take 1 tablet by mouth daily. 11/21/22   [provider]  naproxen sodium (ALEVE)  220 MG tablet Take 220 mg by mouth daily as needed. Back and muscle pain 24 hr tablet    [provider]  risedronate  (ACTONEL ) 35 MG tablet Take 1 tablet (35 mg total) by mouth every 7 (seven) days. with water on empty stomach, nothing by mouth or lie down for next 30 minutes. Patient not taking: Reported on 05/23/2024 11/24/23   Gasper Nancyann BRAVO, MD  traZODone  (DESYREL ) 50 MG tablet Take 1-2 tablets (50-100 mg total) by mouth at bedtime. 01/31/24   Gasper Nancyann BRAVO, MD    Family History Family History  Problem Relation Age of Onset   Heart attack Mother    Diabetes Father    Other Father 88       auto accident   Lung cancer Brother    Heart Problems Brother    Heart  Problems Brother        Has stents   Liver cancer Son    Breast cancer Neg Hx     Social History Social History[1]   Allergies   Amlodipine , Diltiazem  hcl, Fosamax  [alendronate  sodium], Gabapentin , Hydrochlorothiazide , Penicillins, and Actonel  [risedronate ]   Review of Systems Review of Systems  Constitutional:  Negative for chills and fever.  HENT:  Positive for congestion and voice change. Negative for ear pain and sore throat.   Respiratory:  Positive for cough. Negative for shortness of breath.   Cardiovascular:  Negative for chest pain and palpitations.     Physical Exam Triage Vital Signs ED Triage Vitals  Encounter Vitals Group     BP 12/10/24 1035 (S) (!) 185/102     Girls Systolic BP Percentile --      Girls Diastolic BP Percentile --      Boys Systolic BP Percentile --      Boys Diastolic BP Percentile --      Pulse Rate 12/10/24 1035 99     Resp 12/10/24 1035 16     Temp 12/10/24 1035 97.9 F (36.6 C)     Temp Source 12/10/24 1035 Oral     SpO2 12/10/24 1035 95 %     Weight --      Height --      Head Circumference --      Peak Flow --      Pain Score 12/10/24 1036 0     Pain Loc --      Pain Education --      Exclude from Growth Chart --    No data found.  Updated Vital Signs BP (!) 154/86   Pulse 99   Temp 97.9 F (36.6 C) (Oral)   Resp 16   SpO2 95%   Visual Acuity Right Eye Distance:   Left Eye Distance:   Bilateral Distance:    Right Eye Near:   Left Eye Near:    Bilateral Near:     Physical Exam Constitutional:      General: She is not in acute distress. HENT:     Right Ear: Tympanic membrane normal.     Left Ear: Tympanic membrane normal.     Nose: Nose normal.     Mouth/Throat:     Mouth: Mucous membranes are moist.     Pharynx: Oropharynx is clear.     Comments: PND Cardiovascular:     Rate and Rhythm: Normal rate and regular rhythm.     Heart sounds: Normal heart sounds.  Pulmonary:     Effort: Pulmonary effort is  normal. No respiratory distress.  Breath sounds: Normal breath sounds.  Neurological:     Mental Status: She is alert.      UC Treatments / Results  Labs (all labs ordered are listed, but only abnormal results are displayed) Labs Reviewed - No data to display  EKG   Radiology No results found.  Procedures Procedures (including critical care time)  Medications Ordered in UC Medications - No data to display  Initial Impression / Assessment and Plan / UC Course  I have reviewed the triage vital signs and the nursing notes.  Pertinent labs & imaging results that were available during my care of the patient were reviewed by me and considered in my medical decision making (see chart for details).    Cough, acute upper respiratory infection, elevated blood pressure reading with hypertension.  Afebrile.  Lungs are clear and O2 sat is 95% on room air.  Patient has been symptomatic for 2 weeks.  Treating today with Zithromax .  Education provided on URI and instructed patient to follow-up with her PCP.  Also discussed with patient that her blood pressure is elevated today and needs to be rechecked by her PCP.  She states her PCP stopped her blood pressure medication due to an allergy.  She monitors her blood pressure at home and reports 141/80 this morning.  She reports she always has elevated blood pressures when she is at the doctor's office.  Education provided on managing hypertension.  She agrees to plan of care.  Final Clinical Impressions(s) / UC Diagnoses   Final diagnoses:  Acute cough  Acute upper respiratory infection  Elevated blood pressure reading in office with diagnosis of hypertension     Discharge Instructions      Take the Zithromax  as directed for your upper respiratory infection.  Follow-up with your primary care provider.  Your blood pressure is elevated today at 185/102;repeat 171/81; repeated again 154/82.  Please have this rechecked by your primary care  provider.          ED Prescriptions     Medication Sig Dispense Auth. Provider   azithromycin  (ZITHROMAX ) 250 MG tablet Take 1 tablet (250 mg total) by mouth daily. Take first 2 tablets together, then 1 every day until finished. 6 tablet Corlis Burnard DEL, NP      PDMP not reviewed this encounter.    [1]  Social History Tobacco Use   Smoking status: Never   Smokeless tobacco: Never  Vaping Use   Vaping status: Never Used  Substance Use Topics   Alcohol use: No    Alcohol/week: 0.0 standard drinks of alcohol   Drug use: No     Corlis Burnard DEL, NP 12/10/24 1116  "

## 2025-01-08 ENCOUNTER — Ambulatory Visit: Payer: Medicare Other
